# Patient Record
Sex: Female | Born: 1935 | Race: White | Hispanic: No | Smoking: Never smoker
Health system: Southern US, Community
[De-identification: ages and names within clinical notes are randomized; demographics above are authoritative.]

## PROBLEM LIST (undated history)

## (undated) DIAGNOSIS — M1990 Unspecified osteoarthritis, unspecified site: Secondary | ICD-10-CM

## (undated) DIAGNOSIS — D62 Acute posthemorrhagic anemia: Secondary | ICD-10-CM

## (undated) DIAGNOSIS — G8929 Other chronic pain: Secondary | ICD-10-CM

## (undated) DIAGNOSIS — M810 Age-related osteoporosis without current pathological fracture: Secondary | ICD-10-CM

## (undated) DIAGNOSIS — R51 Headache: Secondary | ICD-10-CM

## (undated) DIAGNOSIS — F329 Major depressive disorder, single episode, unspecified: Secondary | ICD-10-CM

## (undated) DIAGNOSIS — M1612 Unilateral primary osteoarthritis, left hip: Principal | ICD-10-CM

## (undated) DIAGNOSIS — H269 Unspecified cataract: Secondary | ICD-10-CM

## (undated) DIAGNOSIS — Q762 Congenital spondylolisthesis: Secondary | ICD-10-CM

## (undated) DIAGNOSIS — M549 Dorsalgia, unspecified: Secondary | ICD-10-CM

## (undated) DIAGNOSIS — R351 Nocturia: Secondary | ICD-10-CM

## (undated) DIAGNOSIS — Z9889 Other specified postprocedural states: Secondary | ICD-10-CM

## (undated) DIAGNOSIS — G4700 Insomnia, unspecified: Secondary | ICD-10-CM

## (undated) DIAGNOSIS — R112 Nausea with vomiting, unspecified: Secondary | ICD-10-CM

## (undated) DIAGNOSIS — M2540 Effusion, unspecified joint: Secondary | ICD-10-CM

## (undated) DIAGNOSIS — K222 Esophageal obstruction: Secondary | ICD-10-CM

## (undated) DIAGNOSIS — M2550 Pain in unspecified joint: Secondary | ICD-10-CM

## (undated) DIAGNOSIS — Z8709 Personal history of other diseases of the respiratory system: Secondary | ICD-10-CM

## (undated) DIAGNOSIS — Z860100 Personal history of colon polyps, unspecified: Secondary | ICD-10-CM

## (undated) DIAGNOSIS — H409 Unspecified glaucoma: Secondary | ICD-10-CM

## (undated) DIAGNOSIS — D649 Anemia, unspecified: Secondary | ICD-10-CM

## (undated) DIAGNOSIS — F32A Depression, unspecified: Secondary | ICD-10-CM

## (undated) DIAGNOSIS — R238 Other skin changes: Secondary | ICD-10-CM

## (undated) DIAGNOSIS — R1310 Dysphagia, unspecified: Secondary | ICD-10-CM

## (undated) DIAGNOSIS — M48061 Spinal stenosis, lumbar region without neurogenic claudication: Secondary | ICD-10-CM

## (undated) DIAGNOSIS — I4891 Unspecified atrial fibrillation: Secondary | ICD-10-CM

## (undated) DIAGNOSIS — I10 Essential (primary) hypertension: Secondary | ICD-10-CM

## (undated) DIAGNOSIS — D126 Benign neoplasm of colon, unspecified: Secondary | ICD-10-CM

## (undated) DIAGNOSIS — R233 Spontaneous ecchymoses: Secondary | ICD-10-CM

## (undated) DIAGNOSIS — K219 Gastro-esophageal reflux disease without esophagitis: Secondary | ICD-10-CM

## (undated) HISTORY — PX: EPIDURAL BLOCK INJECTION: SHX1516

## (undated) HISTORY — DX: Esophageal obstruction: K22.2

## (undated) HISTORY — PX: TOTAL HIP ARTHROPLASTY: SHX124

## (undated) HISTORY — DX: Congenital spondylolisthesis: Q76.2

## (undated) HISTORY — PX: OTHER SURGICAL HISTORY: SHX169

## (undated) HISTORY — DX: Age-related osteoporosis without current pathological fracture: M81.0

## (undated) HISTORY — PX: ESOPHAGOGASTRODUODENOSCOPY: SHX1529

## (undated) HISTORY — DX: Benign neoplasm of colon, unspecified: D12.6

## (undated) HISTORY — PX: APPENDECTOMY: SHX54

## (undated) HISTORY — DX: Spinal stenosis, lumbar region without neurogenic claudication: M48.061

## (undated) HISTORY — DX: Dysphagia, unspecified: R13.10

## (undated) HISTORY — DX: Acute posthemorrhagic anemia: D62

## (undated) HISTORY — DX: Unilateral primary osteoarthritis, left hip: M16.12

## (undated) HISTORY — PX: COLONOSCOPY: SHX174

---

## 1997-09-16 ENCOUNTER — Other Ambulatory Visit: Admission: RE | Admit: 1997-09-16 | Discharge: 1997-09-16 | Payer: Self-pay | Admitting: Obstetrics and Gynecology

## 1998-05-20 ENCOUNTER — Ambulatory Visit (HOSPITAL_BASED_OUTPATIENT_CLINIC_OR_DEPARTMENT_OTHER): Admission: RE | Admit: 1998-05-20 | Discharge: 1998-05-20 | Payer: Self-pay | Admitting: Plastic Surgery

## 1998-09-29 ENCOUNTER — Other Ambulatory Visit: Admission: RE | Admit: 1998-09-29 | Discharge: 1998-09-29 | Payer: Self-pay | Admitting: Obstetrics and Gynecology

## 1998-10-20 ENCOUNTER — Other Ambulatory Visit: Admission: RE | Admit: 1998-10-20 | Discharge: 1998-10-20 | Payer: Self-pay | Admitting: Obstetrics and Gynecology

## 1998-11-06 ENCOUNTER — Encounter: Payer: Self-pay | Admitting: Obstetrics and Gynecology

## 1998-11-06 ENCOUNTER — Ambulatory Visit: Admission: RE | Admit: 1998-11-06 | Discharge: 1998-11-06 | Payer: Self-pay | Admitting: Obstetrics and Gynecology

## 1998-12-16 ENCOUNTER — Encounter (INDEPENDENT_AMBULATORY_CARE_PROVIDER_SITE_OTHER): Payer: Self-pay | Admitting: Specialist

## 1998-12-16 ENCOUNTER — Ambulatory Visit (HOSPITAL_COMMUNITY): Admission: RE | Admit: 1998-12-16 | Discharge: 1998-12-16 | Payer: Self-pay | Admitting: Obstetrics and Gynecology

## 1999-04-12 ENCOUNTER — Other Ambulatory Visit: Admission: RE | Admit: 1999-04-12 | Discharge: 1999-04-12 | Payer: Self-pay | Admitting: Obstetrics and Gynecology

## 1999-04-12 ENCOUNTER — Encounter (INDEPENDENT_AMBULATORY_CARE_PROVIDER_SITE_OTHER): Payer: Self-pay | Admitting: Specialist

## 1999-06-14 HISTORY — PX: JOINT REPLACEMENT: SHX530

## 1999-12-16 ENCOUNTER — Other Ambulatory Visit: Admission: RE | Admit: 1999-12-16 | Discharge: 1999-12-16 | Payer: Self-pay | Admitting: Obstetrics and Gynecology

## 2000-03-16 ENCOUNTER — Encounter (INDEPENDENT_AMBULATORY_CARE_PROVIDER_SITE_OTHER): Payer: Self-pay | Admitting: Specialist

## 2000-03-16 ENCOUNTER — Ambulatory Visit (HOSPITAL_BASED_OUTPATIENT_CLINIC_OR_DEPARTMENT_OTHER): Admission: RE | Admit: 2000-03-16 | Discharge: 2000-03-16 | Payer: Self-pay | Admitting: Plastic Surgery

## 2000-06-19 ENCOUNTER — Encounter: Payer: Self-pay | Admitting: Orthopedic Surgery

## 2000-06-22 ENCOUNTER — Inpatient Hospital Stay (HOSPITAL_COMMUNITY): Admission: RE | Admit: 2000-06-22 | Discharge: 2000-06-29 | Payer: Self-pay | Admitting: Orthopedic Surgery

## 2000-06-22 ENCOUNTER — Encounter: Payer: Self-pay | Admitting: Orthopedic Surgery

## 2000-12-18 ENCOUNTER — Other Ambulatory Visit: Admission: RE | Admit: 2000-12-18 | Discharge: 2000-12-18 | Payer: Self-pay | Admitting: Obstetrics and Gynecology

## 2002-02-18 ENCOUNTER — Encounter: Admission: RE | Admit: 2002-02-18 | Discharge: 2002-02-18 | Payer: Self-pay | Admitting: Orthopedic Surgery

## 2002-02-18 ENCOUNTER — Encounter: Payer: Self-pay | Admitting: Orthopedic Surgery

## 2002-02-19 ENCOUNTER — Ambulatory Visit (HOSPITAL_BASED_OUTPATIENT_CLINIC_OR_DEPARTMENT_OTHER): Admission: RE | Admit: 2002-02-19 | Discharge: 2002-02-19 | Payer: Self-pay | Admitting: Orthopedic Surgery

## 2002-04-08 ENCOUNTER — Encounter: Payer: Self-pay | Admitting: Orthopedic Surgery

## 2002-04-08 ENCOUNTER — Encounter: Admission: RE | Admit: 2002-04-08 | Discharge: 2002-04-08 | Payer: Self-pay | Admitting: Orthopedic Surgery

## 2002-04-22 ENCOUNTER — Encounter: Payer: Self-pay | Admitting: Orthopedic Surgery

## 2002-04-22 ENCOUNTER — Encounter: Admission: RE | Admit: 2002-04-22 | Discharge: 2002-04-22 | Payer: Self-pay | Admitting: Orthopedic Surgery

## 2002-05-06 ENCOUNTER — Encounter: Admission: RE | Admit: 2002-05-06 | Discharge: 2002-05-06 | Payer: Self-pay | Admitting: Orthopedic Surgery

## 2002-05-06 ENCOUNTER — Encounter: Payer: Self-pay | Admitting: Orthopedic Surgery

## 2002-09-12 ENCOUNTER — Ambulatory Visit (HOSPITAL_COMMUNITY): Admission: RE | Admit: 2002-09-12 | Discharge: 2002-09-12 | Payer: Self-pay | Admitting: Internal Medicine

## 2002-09-12 ENCOUNTER — Encounter: Payer: Self-pay | Admitting: Internal Medicine

## 2003-10-30 ENCOUNTER — Encounter: Admission: RE | Admit: 2003-10-30 | Discharge: 2003-10-30 | Payer: Self-pay | Admitting: Orthopedic Surgery

## 2003-11-18 ENCOUNTER — Ambulatory Visit (HOSPITAL_BASED_OUTPATIENT_CLINIC_OR_DEPARTMENT_OTHER): Admission: RE | Admit: 2003-11-18 | Discharge: 2003-11-18 | Payer: Self-pay | Admitting: Orthopedic Surgery

## 2003-11-18 ENCOUNTER — Ambulatory Visit (HOSPITAL_COMMUNITY): Admission: RE | Admit: 2003-11-18 | Discharge: 2003-11-18 | Payer: Self-pay | Admitting: Orthopedic Surgery

## 2003-11-21 ENCOUNTER — Encounter: Admission: RE | Admit: 2003-11-21 | Discharge: 2003-11-21 | Payer: Self-pay | Admitting: Orthopedic Surgery

## 2003-12-10 ENCOUNTER — Encounter: Admission: RE | Admit: 2003-12-10 | Discharge: 2003-12-10 | Payer: Self-pay | Admitting: Orthopedic Surgery

## 2007-10-09 ENCOUNTER — Telehealth: Payer: Self-pay | Admitting: Internal Medicine

## 2007-10-17 ENCOUNTER — Ambulatory Visit: Payer: Self-pay | Admitting: Internal Medicine

## 2007-10-23 ENCOUNTER — Encounter: Payer: Self-pay | Admitting: Internal Medicine

## 2007-10-23 ENCOUNTER — Ambulatory Visit: Payer: Self-pay | Admitting: Internal Medicine

## 2008-10-27 ENCOUNTER — Ambulatory Visit: Payer: Self-pay | Admitting: Internal Medicine

## 2008-10-27 DIAGNOSIS — Z860100 Personal history of colon polyps, unspecified: Secondary | ICD-10-CM | POA: Insufficient documentation

## 2008-10-27 DIAGNOSIS — K219 Gastro-esophageal reflux disease without esophagitis: Secondary | ICD-10-CM

## 2008-10-27 DIAGNOSIS — K222 Esophageal obstruction: Secondary | ICD-10-CM

## 2008-10-27 DIAGNOSIS — R1319 Other dysphagia: Secondary | ICD-10-CM

## 2010-04-21 ENCOUNTER — Encounter: Admission: RE | Admit: 2010-04-21 | Discharge: 2010-04-21 | Payer: Self-pay | Admitting: Neurological Surgery

## 2010-07-14 ENCOUNTER — Other Ambulatory Visit: Payer: Self-pay | Admitting: Dermatology

## 2010-10-29 NOTE — Op Note (Signed)
NAME:  Martha Hendrix, Martha Hendrix                     ACCOUNT NO.:  [id number]   MEDICAL RECORD NO.:  [id number]                   PATIENT TYPE:  AMB   LOCATION:  DSC                                  FACILITY:  [facility]   PHYSICIAN:  Katy Fitch. Naaman Plummer., M.D.          DATE OF BIRTH:  Feb 24, 1927   DATE OF PROCEDURE:  11/18/2003  DATE OF DISCHARGE:                                 OPERATIVE REPORT   PREOPERATIVE DIAGNOSIS:  Entrapment neuropathy, median nerve, right carpal  tunnel.   POSTOPERATIVE DIAGNOSIS:  Entrapment neuropathy, median nerve, right carpal  tunnel.   OPERATION:  Release of right transcarpal ligament.   SURGEON:  Katy Fitch. Sypher, M.D.   ASSISTANT:  Martha Hendrix, P.A.   ANESTHESIA:  General by LMA, supervised by anesthesiologist, Dr. Sondra Come.   INDICATIONS:  Martha Hendrix is a 77 year old woman who has a history of  bilateral carpal tunnel syndrome.  She is status post release of her left  transverse carpal ligament with a satisfactory result several years past.  She returned requesting identical surgery on the right.   Prior electrodiagnostic studies had revealed significant right carpal tunnel  syndrome.   After informed consent, she was brought to the operating room at this time.   PROCEDURE:  [name] was brought to the operating room and placed  in supine position upon the operating table.  Following induction of general  anesthesia by LMA, the right arm was prepped with Betadine soap and solution  and sterilely draped.  A pneumatic tourniquet was applied to the proximal  brachium.   After exsanguination of the right arm with an Esmarch bandage, the arterial  tourniquet was placed to 220 mmHg.   The procedure commenced with a short incision in line with the ring finger  in the palm.  Subcutaneous tissues were carefully divided revealing the  palmar fascia.  This was split longitudinally in the ________ branch of the  median nerve.   These  were followed back to the transverse carpal ligament, which was  carefully isolated from the median nerve.   The ligament was released along the ulnar border extending into the distal  forearm.   This widened the endocarpal canal.  No mass or other predicaments were  noted.  Bleeding points along the margin of this ligament were  insignificant, therefore, no electrocautery was required.   The wound was repaired with intradermal 3-0 Prolene suture.   A compressive dressing was applied with a volar plaster splint and  mechanically in 5 degrees of dorsiflexion.   AFTERCARE:  [name] was given a prescription for Darvocet-N 100, 20  tablets, 1 p.o. q.4-6h. p.r.n. pain, no refills.                                               Katy Fitch  Naaman Plummer., M.D.    RVS/MEDQ  D:  11/18/2003  T:  11/19/2003  Job:  [id number]   cc:   Geoffry Paradise, M.D.  [address]  [place]  [place] [address]  Fax: [phone]

## 2010-10-29 NOTE — Op Note (Signed)
[place]. [facility]  Patient:    DESTA, BUJAK                  MRN: [id number] Proc. Date: 06/22/00 Adm. Date:  [id number] Attending:  Aldean Baker V                           Operative Report  PREOPERATIVE DIAGNOSIS:  Osteoarthritis, right hip.  POSTOPERATIVE DIAGNOSIS:  Osteoarthritis, right hip.  PROCEDURE:  Right total hip arthroplasty with Osteonics Secure Fit acetabulum and Omnifit femoral component with #8 stem, pressfit, 54 mm acetabulum, +0 neck, 28 mm head, 10 degree poly liner.  SURGEON:  Nadara Mustard, M.D.  ASSISTANT:  Colon Flattery. Ollen Bowl, M.D.  ANESTHESIA:  General endotracheal.  ESTIMATED BLOOD LOSS:  300 cc.  ANTIBIOTICS:  1 g Kefzol.  DRAINS:  None.  COMPLICATIONS:  None.  DISPOSITION:  PACU in stable condition.  INDICATION FOR PROCEDURE:  Patient is a 77 year old woman with severe osteoarthritis of her right hip.  Patient is unable to perform activities of daily living due to her right hip symptoms and presents at this time for total hip replacement.  The risks and benefits were discussed _____ with leg length and equality, dislocation, infection, nerve injury, DVT.  The patient states she understands and wishes to proceed at this time.  DESCRIPTION OF PROCEDURE:  The patient was brought to the OR room 4 and underwent a general endotracheal anesthetic.  After an adequate level of anesthesia obtained, the patient was placed in the left lateral decubitus position with the right side up, and her left lower extremity was prepped using Duraprep and draped in a sterile field with Ioban covering all exposed skin.  A posterolateral incision was made.  This was carried down through the tensor fascia lata, and a Charnley retractor was placed.  The short external rotators were tagged and retracted.  The capsule was tagged and teed and used later for repair.  The femoral neck cut was made 1 cm proximal to the calcar on the angle of  one of the broaches.  Attention was first focused on the acetabulum.  This was reamed up to a 54 mm.  There were large amounts of osteophytic bone cysts, and the cysts were curetted and packed with bone graft from the femoral head.  All osteophytes around the lip were also debrided. The final 54 mm acetabulum was inserted with a good press-fit.  The trial poly liner was inserted, and attention was then focused on the femoral component. This was reamed and broached up to an 8 mm femoral stem.  This was broached and distally reamed up to a 12.5 for the Secure Fit Plus distal tip.  This was placed through a range of motion with a +0 neck and had a full 100 degrees of flexion, full adduction, and 45 degrees of internal rotation.  The hip was stable.  The patient had full extension and external rotation with no instability anteriorly.  There was a negative shuck.  The wound was then irrigated.  The final femoral component was placed with a +0 neck, reduced, again placed through a full range of motion.  The hip was stable with no dislocation.  The wound was again irrigated.  The capsule was closed using a #1 Ethibond.  The tensor fascia lata was closed using #1 Vicryl.  The deep fascia was closed using 0 Vicryl.  The subcutaneous tissue was closed  using 2-0 Vicryl.  The skin was closed using Approximate staples.  The wounds were covered with Adaptic orthopedic sponges, ABD dressing, and Hypafix tape. The patient was transferred supine.  Knee immobilizer was applied.  Extubated and taken to PACU in stable condition. DD:  06/22/00 TD:  06/22/00 Job: [id number]

## 2010-10-29 NOTE — H&P (Signed)
[place]. [facility]  Patient:    Martha Hendrix, Martha Hendrix                  MRN: [id number] Adm. Date:  [id number] Attending:  Nadara Mustard                         History and Physical  HISTORY OF PRESENT ILLNESS:  Martha Hendrix is a 77 year old woman with severe right hip pain gradually increasing status post a right total knee on Nov 07, 1999 in [place].  The patient states that due to decreasing range of motion of her hip, unable to perform activities of daily living.  She presents at this time for a total hip arthroplasty.  MEDICATIONS:  Vioxx 50 mg p.o. q.d.  Darvocet N 100 one p.o. q.8h. p.r.n. for pain.  Estrogen replacement with Provera 2.5 mg a day and Estrace 1 mg a day.  ALLERGIES:  Codeine causes nausea and vomiting.  PAST SURGICAL HISTORY:  Right total knee replacement in May 2001.  FAMILY HISTORY:  Positive for diabetes and heart disease.  SOCIAL HISTORY:  Negative for tobacco.  Positive for social alcohol use.  REVIEW OF SYSTEMS:  Positive for arthritis. Negative for substernal chest pain.  Negative for shortness of breath.  PHYSICAL EXAMINATION:  VITAL SIGNS:  Temperature 97.8, pulse 74, respiratory rate 16, blood pressure 138/90.  GENERAL:  She is in no acute distress.  NECK: Supple.  No bruits.  LUNGS: Clear to auscultation.  CARDIOVASCULAR:  Regular rate and rhythm.  EXTREMITIES: Internal and external rotation of her right hip is less than 10 degrees.  She does have full flexion to 90 degrees and extension of 0 degrees. Left hip has 45 degrees of internal and external rotation.  She does have palpable pulses.  Foot is neurovascularly intact.  Radiograph shows a severe bone on bone degenerative osteoarthritis of the right hip.  ASSESSMENT:  Severe osteoarthritis right hip.  PLAN:  She is scheduled at this time for right total hip arthroplasty.  The risks and benefits were discussed including infection, neurovascular  injury, persistent pain, dislocation, leg length unequalities, DVT, pulmonary embolus and death.  The patient states she understands and wishes to proceed at this time. DD:  06/22/00 TD:  06/22/00 Job: [id number]

## 2010-10-29 NOTE — Op Note (Signed)
Martha Hendrix, Martha Hendrix                     ACCOUNT NO.:  [id number]   MEDICAL RECORD NO.:  [id number]                   PATIENT TYPE:  AMB   LOCATION:  DSC                                  FACILITY:  [facility]   PHYSICIAN:  Katy Fitch. Naaman Plummer., M.D.          DATE OF BIRTH:  04-22-1927   DATE OF PROCEDURE:  02/18/2002  DATE OF DISCHARGE:                                 OPERATIVE REPORT   PREOPERATIVE DIAGNOSES:  Chronic entrapment neuropathy of left median nerve  at carpal tunnel, severe.   POSTOPERATIVE DIAGNOSES:  Chronic entrapment neuropathy of left median nerve  at carpal tunnel, severe.   OPERATION PERFORMED:  Release of left transverse carpal ligament.   SURGEON:  Katy Fitch. Sypher, M.D.   ASSISTANT:  Jonni Sanger, P.A.   ANESTHESIA:  IV regional at proximal forearm level.   SUPERVISING ANESTHESIOLOGIST:  Janetta Hora. Gelene Mink, M.D.   INDICATIONS FOR PROCEDURE:  The patient is a 77 year old right hand dominant  woman who presented for evaluation and management of a painful and numb left  hand.  Clinical examination revealed signs of mild thenar atrophy and  diminished sensibility in the median distribution.  Electrodiagnostic  studies completed by Dr. Johna Roles revealed severe carpal tunnel syndrome  with a nonrecordable motor latency.  Due to failure to respond to nonoperative measures, she is brought to the  operating room at this time for release of the left transverse carpal  ligament.   DESCRIPTION OF PROCEDURE:  The patient was brought to the operating room and  placed in supine position upon the operating table.  Following placement of  a forearm level IV regional block, the left arm was prepped with Betadine  soap and solution and sterilely draped.  When anesthesia was satisfactory,  the procedure commenced with a short incision in line with the ring finger  in the palm.  Subcutaneous tissues were carefully divided revealing the  palmar fascia.  This was  split longitudinally to reveal the common sensory  branch of the median nerve.  This was followed back to the transverse carpal  ligament which was carefully isolated from the median nerve.  It was  released on its ulnar border extending to the distal forearm.  This widely  opened the carpal canal.  Bleeding points along the margin of the released  ligament were electrocauterized with bipolar current followed by repair of  the skin with intradermal 3-0 Prolene suture.  A compressive dressing was  applied with a volar plaster splint maintaining the wrist in five degrees  dorsiflexion.  There were no apparent complications.  For aftercare, she was  given a Marcaine block for postoperative analgesia.  She was given a  prescription for Darvocet-N 100 20 tablets one or two p.o. q.4-6h. p.r.n.  pain.  We have asked the patient to return to our office in follow-up in 7  to 10 days for dressing change, suture removal and advancement to our  therapy program.                                                Katy Fitch. Naaman Plummer., M.D.   RVS/MEDQ  D:  02/19/2002  T:  02/19/2002  Job:  [id number]

## 2010-10-29 NOTE — Discharge Summary (Signed)
[place]. [facility]  Patient:    Martha Hendrix, Martha Hendrix                  MRN: [id number] Adm. Date:  [id number] Disc. Date: [id number] Attending:  Aldean Baker V                           Discharge Summary  DIAGNOSIS:  Osteoarthritis, right hip.  PROCEDURE:  Right total hip arthroplasty.  DISPOSITION:  Discharged to home on stable condition.  FOLLOW-UP:  Follow up in the office in two weeks.  HISTORY OF PRESENT ILLNESS:  The patient is a 77 year old woman with severe right hip osteoarthritis, status post a right total knee replacement in 2001 in [place], [place].  Due to her decreasing range of motion, unable to perform activities of daily living due to right hip pain, she presents at this time for a right total hip arthroplasty.  BRIEF OPERATIVE NOTE:  June 23, 1999.  Diagnosis:  Right hip osteoarthritis.  Procedure:  Right total hip arthroplasty with Osteonics #8 stem, 54 mm acetabulum, +0 neck, 28 mm head.  Surgeon:  Nadara Mustard, M.D. Assistant:  Colon Flattery. Ollen Bowl, M.D.  Anesthesia:  General endotracheal. Antibiotics:  1 g of Kefzol.  Disposition:  To PACU in stable condition.  HOSPITAL COURSE:  The patients postoperative course was essentially unremarkable.  She was treated with Coumadin for DVT prophylaxis and Kefzol for infection prophylaxis.  She was started with physical therapy and occupational therapy.  She was started with touchdown weightbearing on the right.  She progressed well with therapy.  Her hemoglobin was 10.0 on April 14, 2000.  She complained of some heel pain and she was placed on waffle foam under her heels to decrease the pressure.  She progressed well and was felt to be stable for discharge to home.  She was discharged to home in stable condition on June 29, 2000.  Will plan to follow up in the office in one week.  Continue with home health nursing for home INR draws.  To continue her Coumadin. DD:   07/25/00 TD:  07/25/00 Job: [id number]

## 2011-06-16 DIAGNOSIS — H4010X0 Unspecified open-angle glaucoma, stage unspecified: Secondary | ICD-10-CM | POA: Diagnosis not present

## 2011-06-16 DIAGNOSIS — H5340 Unspecified visual field defects: Secondary | ICD-10-CM | POA: Diagnosis not present

## 2011-08-08 DIAGNOSIS — Q762 Congenital spondylolisthesis: Secondary | ICD-10-CM | POA: Diagnosis not present

## 2011-08-08 DIAGNOSIS — I10 Essential (primary) hypertension: Secondary | ICD-10-CM | POA: Diagnosis not present

## 2011-08-08 DIAGNOSIS — M1990 Unspecified osteoarthritis, unspecified site: Secondary | ICD-10-CM | POA: Diagnosis not present

## 2011-08-17 DIAGNOSIS — H409 Unspecified glaucoma: Secondary | ICD-10-CM | POA: Diagnosis not present

## 2011-08-17 DIAGNOSIS — H4010X0 Unspecified open-angle glaucoma, stage unspecified: Secondary | ICD-10-CM | POA: Diagnosis not present

## 2011-11-17 DIAGNOSIS — M1710 Unilateral primary osteoarthritis, unspecified knee: Secondary | ICD-10-CM | POA: Diagnosis not present

## 2011-11-18 DIAGNOSIS — I10 Essential (primary) hypertension: Secondary | ICD-10-CM | POA: Diagnosis not present

## 2011-11-18 DIAGNOSIS — R82998 Other abnormal findings in urine: Secondary | ICD-10-CM | POA: Diagnosis not present

## 2011-11-18 DIAGNOSIS — E785 Hyperlipidemia, unspecified: Secondary | ICD-10-CM | POA: Diagnosis not present

## 2011-12-02 DIAGNOSIS — Z0000 Encounter for general adult medical examination without abnormal findings: Secondary | ICD-10-CM | POA: Diagnosis not present

## 2011-12-02 DIAGNOSIS — I10 Essential (primary) hypertension: Secondary | ICD-10-CM | POA: Diagnosis not present

## 2011-12-02 DIAGNOSIS — E785 Hyperlipidemia, unspecified: Secondary | ICD-10-CM | POA: Diagnosis not present

## 2011-12-02 DIAGNOSIS — M1990 Unspecified osteoarthritis, unspecified site: Secondary | ICD-10-CM | POA: Diagnosis not present

## 2011-12-19 DIAGNOSIS — M47817 Spondylosis without myelopathy or radiculopathy, lumbosacral region: Secondary | ICD-10-CM | POA: Diagnosis not present

## 2011-12-21 DIAGNOSIS — H4011X0 Primary open-angle glaucoma, stage unspecified: Secondary | ICD-10-CM | POA: Diagnosis not present

## 2011-12-21 DIAGNOSIS — H409 Unspecified glaucoma: Secondary | ICD-10-CM | POA: Diagnosis not present

## 2011-12-21 DIAGNOSIS — H259 Unspecified age-related cataract: Secondary | ICD-10-CM | POA: Diagnosis not present

## 2012-03-30 DIAGNOSIS — Z1231 Encounter for screening mammogram for malignant neoplasm of breast: Secondary | ICD-10-CM | POA: Diagnosis not present

## 2012-04-02 DIAGNOSIS — H6120 Impacted cerumen, unspecified ear: Secondary | ICD-10-CM | POA: Diagnosis not present

## 2012-04-20 DIAGNOSIS — H409 Unspecified glaucoma: Secondary | ICD-10-CM | POA: Diagnosis not present

## 2012-04-20 DIAGNOSIS — H259 Unspecified age-related cataract: Secondary | ICD-10-CM | POA: Diagnosis not present

## 2012-04-20 DIAGNOSIS — H4011X0 Primary open-angle glaucoma, stage unspecified: Secondary | ICD-10-CM | POA: Diagnosis not present

## 2012-04-20 DIAGNOSIS — H04129 Dry eye syndrome of unspecified lacrimal gland: Secondary | ICD-10-CM | POA: Diagnosis not present

## 2012-04-24 DIAGNOSIS — Z23 Encounter for immunization: Secondary | ICD-10-CM | POA: Diagnosis not present

## 2012-06-01 DIAGNOSIS — M47817 Spondylosis without myelopathy or radiculopathy, lumbosacral region: Secondary | ICD-10-CM | POA: Diagnosis not present

## 2012-06-13 HISTORY — PX: OTHER SURGICAL HISTORY: SHX169

## 2012-06-20 DIAGNOSIS — I10 Essential (primary) hypertension: Secondary | ICD-10-CM | POA: Diagnosis not present

## 2012-06-20 DIAGNOSIS — M1990 Unspecified osteoarthritis, unspecified site: Secondary | ICD-10-CM | POA: Diagnosis not present

## 2012-06-20 DIAGNOSIS — E785 Hyperlipidemia, unspecified: Secondary | ICD-10-CM | POA: Diagnosis not present

## 2012-06-20 DIAGNOSIS — K219 Gastro-esophageal reflux disease without esophagitis: Secondary | ICD-10-CM | POA: Diagnosis not present

## 2012-08-08 DIAGNOSIS — I10 Essential (primary) hypertension: Secondary | ICD-10-CM | POA: Diagnosis not present

## 2012-08-08 DIAGNOSIS — M79609 Pain in unspecified limb: Secondary | ICD-10-CM | POA: Diagnosis not present

## 2012-09-20 DIAGNOSIS — M25519 Pain in unspecified shoulder: Secondary | ICD-10-CM | POA: Diagnosis not present

## 2012-09-20 DIAGNOSIS — M542 Cervicalgia: Secondary | ICD-10-CM | POA: Diagnosis not present

## 2012-09-26 DIAGNOSIS — H4011X0 Primary open-angle glaucoma, stage unspecified: Secondary | ICD-10-CM | POA: Diagnosis not present

## 2012-10-08 DIAGNOSIS — H259 Unspecified age-related cataract: Secondary | ICD-10-CM | POA: Diagnosis not present

## 2012-10-08 DIAGNOSIS — H4011X0 Primary open-angle glaucoma, stage unspecified: Secondary | ICD-10-CM | POA: Diagnosis not present

## 2012-10-08 DIAGNOSIS — H409 Unspecified glaucoma: Secondary | ICD-10-CM | POA: Diagnosis not present

## 2012-10-08 DIAGNOSIS — H5340 Unspecified visual field defects: Secondary | ICD-10-CM | POA: Diagnosis not present

## 2012-10-09 DIAGNOSIS — T148XXA Other injury of unspecified body region, initial encounter: Secondary | ICD-10-CM | POA: Diagnosis not present

## 2012-10-09 DIAGNOSIS — M79609 Pain in unspecified limb: Secondary | ICD-10-CM | POA: Diagnosis not present

## 2012-10-09 DIAGNOSIS — Z6825 Body mass index (BMI) 25.0-25.9, adult: Secondary | ICD-10-CM | POA: Diagnosis not present

## 2012-10-19 DIAGNOSIS — D1801 Hemangioma of skin and subcutaneous tissue: Secondary | ICD-10-CM | POA: Diagnosis not present

## 2012-10-19 DIAGNOSIS — L821 Other seborrheic keratosis: Secondary | ICD-10-CM | POA: Diagnosis not present

## 2012-10-19 DIAGNOSIS — Z85828 Personal history of other malignant neoplasm of skin: Secondary | ICD-10-CM | POA: Diagnosis not present

## 2012-10-19 DIAGNOSIS — L819 Disorder of pigmentation, unspecified: Secondary | ICD-10-CM | POA: Diagnosis not present

## 2012-10-22 DIAGNOSIS — I10 Essential (primary) hypertension: Secondary | ICD-10-CM | POA: Diagnosis not present

## 2012-10-22 DIAGNOSIS — T148XXA Other injury of unspecified body region, initial encounter: Secondary | ICD-10-CM | POA: Diagnosis not present

## 2012-11-02 DIAGNOSIS — I10 Essential (primary) hypertension: Secondary | ICD-10-CM | POA: Diagnosis not present

## 2012-11-02 DIAGNOSIS — Q762 Congenital spondylolisthesis: Secondary | ICD-10-CM | POA: Diagnosis not present

## 2012-11-02 DIAGNOSIS — M1990 Unspecified osteoarthritis, unspecified site: Secondary | ICD-10-CM | POA: Diagnosis not present

## 2012-11-02 DIAGNOSIS — M76899 Other specified enthesopathies of unspecified lower limb, excluding foot: Secondary | ICD-10-CM | POA: Diagnosis not present

## 2012-11-06 DIAGNOSIS — H269 Unspecified cataract: Secondary | ICD-10-CM | POA: Diagnosis not present

## 2012-11-06 DIAGNOSIS — H25039 Anterior subcapsular polar age-related cataract, unspecified eye: Secondary | ICD-10-CM | POA: Diagnosis not present

## 2012-11-06 DIAGNOSIS — H2510 Age-related nuclear cataract, unspecified eye: Secondary | ICD-10-CM | POA: Diagnosis not present

## 2012-11-06 DIAGNOSIS — H409 Unspecified glaucoma: Secondary | ICD-10-CM | POA: Diagnosis not present

## 2012-11-06 DIAGNOSIS — H4011X0 Primary open-angle glaucoma, stage unspecified: Secondary | ICD-10-CM | POA: Diagnosis not present

## 2012-11-30 DIAGNOSIS — M76899 Other specified enthesopathies of unspecified lower limb, excluding foot: Secondary | ICD-10-CM | POA: Diagnosis not present

## 2012-12-04 DIAGNOSIS — E785 Hyperlipidemia, unspecified: Secondary | ICD-10-CM | POA: Diagnosis not present

## 2012-12-04 DIAGNOSIS — R82998 Other abnormal findings in urine: Secondary | ICD-10-CM | POA: Diagnosis not present

## 2012-12-04 DIAGNOSIS — I10 Essential (primary) hypertension: Secondary | ICD-10-CM | POA: Diagnosis not present

## 2012-12-10 DIAGNOSIS — E785 Hyperlipidemia, unspecified: Secondary | ICD-10-CM | POA: Diagnosis not present

## 2012-12-10 DIAGNOSIS — M1990 Unspecified osteoarthritis, unspecified site: Secondary | ICD-10-CM | POA: Diagnosis not present

## 2012-12-10 DIAGNOSIS — Z23 Encounter for immunization: Secondary | ICD-10-CM | POA: Diagnosis not present

## 2012-12-10 DIAGNOSIS — K219 Gastro-esophageal reflux disease without esophagitis: Secondary | ICD-10-CM | POA: Diagnosis not present

## 2012-12-10 DIAGNOSIS — Q762 Congenital spondylolisthesis: Secondary | ICD-10-CM | POA: Diagnosis not present

## 2012-12-10 DIAGNOSIS — I10 Essential (primary) hypertension: Secondary | ICD-10-CM | POA: Diagnosis not present

## 2012-12-10 DIAGNOSIS — Z0000 Encounter for general adult medical examination without abnormal findings: Secondary | ICD-10-CM | POA: Diagnosis not present

## 2012-12-21 DIAGNOSIS — R51 Headache: Secondary | ICD-10-CM | POA: Diagnosis not present

## 2012-12-21 DIAGNOSIS — I10 Essential (primary) hypertension: Secondary | ICD-10-CM | POA: Diagnosis not present

## 2012-12-21 DIAGNOSIS — F329 Major depressive disorder, single episode, unspecified: Secondary | ICD-10-CM | POA: Diagnosis not present

## 2012-12-21 DIAGNOSIS — R5381 Other malaise: Secondary | ICD-10-CM | POA: Diagnosis not present

## 2012-12-21 DIAGNOSIS — R5383 Other fatigue: Secondary | ICD-10-CM | POA: Diagnosis not present

## 2012-12-24 DIAGNOSIS — R51 Headache: Secondary | ICD-10-CM | POA: Diagnosis not present

## 2012-12-24 DIAGNOSIS — I6789 Other cerebrovascular disease: Secondary | ICD-10-CM | POA: Diagnosis not present

## 2012-12-26 DIAGNOSIS — H6120 Impacted cerumen, unspecified ear: Secondary | ICD-10-CM | POA: Diagnosis not present

## 2013-01-04 DIAGNOSIS — D692 Other nonthrombocytopenic purpura: Secondary | ICD-10-CM | POA: Diagnosis not present

## 2013-01-04 DIAGNOSIS — L820 Inflamed seborrheic keratosis: Secondary | ICD-10-CM | POA: Diagnosis not present

## 2013-01-04 DIAGNOSIS — L821 Other seborrheic keratosis: Secondary | ICD-10-CM | POA: Diagnosis not present

## 2013-01-04 DIAGNOSIS — Z85828 Personal history of other malignant neoplasm of skin: Secondary | ICD-10-CM | POA: Diagnosis not present

## 2013-01-04 DIAGNOSIS — L570 Actinic keratosis: Secondary | ICD-10-CM | POA: Diagnosis not present

## 2013-01-04 DIAGNOSIS — L819 Disorder of pigmentation, unspecified: Secondary | ICD-10-CM | POA: Diagnosis not present

## 2013-01-04 DIAGNOSIS — D1801 Hemangioma of skin and subcutaneous tissue: Secondary | ICD-10-CM | POA: Diagnosis not present

## 2013-01-15 DIAGNOSIS — M76899 Other specified enthesopathies of unspecified lower limb, excluding foot: Secondary | ICD-10-CM | POA: Diagnosis not present

## 2013-01-30 DIAGNOSIS — F329 Major depressive disorder, single episode, unspecified: Secondary | ICD-10-CM | POA: Diagnosis not present

## 2013-01-30 DIAGNOSIS — E785 Hyperlipidemia, unspecified: Secondary | ICD-10-CM | POA: Diagnosis not present

## 2013-01-30 DIAGNOSIS — M1990 Unspecified osteoarthritis, unspecified site: Secondary | ICD-10-CM | POA: Diagnosis not present

## 2013-01-30 DIAGNOSIS — Z6825 Body mass index (BMI) 25.0-25.9, adult: Secondary | ICD-10-CM | POA: Diagnosis not present

## 2013-01-30 DIAGNOSIS — I10 Essential (primary) hypertension: Secondary | ICD-10-CM | POA: Diagnosis not present

## 2013-01-30 DIAGNOSIS — K219 Gastro-esophageal reflux disease without esophagitis: Secondary | ICD-10-CM | POA: Diagnosis not present

## 2013-02-27 DIAGNOSIS — M169 Osteoarthritis of hip, unspecified: Secondary | ICD-10-CM | POA: Diagnosis not present

## 2013-02-27 DIAGNOSIS — M25559 Pain in unspecified hip: Secondary | ICD-10-CM | POA: Diagnosis not present

## 2013-03-05 DIAGNOSIS — M545 Low back pain: Secondary | ICD-10-CM | POA: Diagnosis not present

## 2013-03-08 DIAGNOSIS — Z23 Encounter for immunization: Secondary | ICD-10-CM | POA: Diagnosis not present

## 2013-03-19 DIAGNOSIS — M25559 Pain in unspecified hip: Secondary | ICD-10-CM | POA: Diagnosis not present

## 2013-04-03 ENCOUNTER — Other Ambulatory Visit: Payer: Self-pay | Admitting: Neurological Surgery

## 2013-04-03 DIAGNOSIS — IMO0002 Reserved for concepts with insufficient information to code with codable children: Secondary | ICD-10-CM | POA: Diagnosis not present

## 2013-04-03 DIAGNOSIS — M5416 Radiculopathy, lumbar region: Secondary | ICD-10-CM

## 2013-04-04 DIAGNOSIS — Z1231 Encounter for screening mammogram for malignant neoplasm of breast: Secondary | ICD-10-CM | POA: Diagnosis not present

## 2013-04-10 DIAGNOSIS — I872 Venous insufficiency (chronic) (peripheral): Secondary | ICD-10-CM | POA: Diagnosis not present

## 2013-04-10 DIAGNOSIS — I809 Phlebitis and thrombophlebitis of unspecified site: Secondary | ICD-10-CM | POA: Diagnosis not present

## 2013-04-10 DIAGNOSIS — I10 Essential (primary) hypertension: Secondary | ICD-10-CM | POA: Diagnosis not present

## 2013-04-10 DIAGNOSIS — Z6825 Body mass index (BMI) 25.0-25.9, adult: Secondary | ICD-10-CM | POA: Diagnosis not present

## 2013-04-13 ENCOUNTER — Ambulatory Visit
Admission: RE | Admit: 2013-04-13 | Discharge: 2013-04-13 | Disposition: A | Discharge status: Home / Self Care | Payer: Medicare Other | Source: Ambulatory Visit | Attending: Neurological Surgery | Admitting: Neurological Surgery

## 2013-04-13 DIAGNOSIS — M4120 Other idiopathic scoliosis, site unspecified: Secondary | ICD-10-CM | POA: Diagnosis not present

## 2013-04-13 DIAGNOSIS — M5416 Radiculopathy, lumbar region: Secondary | ICD-10-CM

## 2013-04-13 DIAGNOSIS — M48061 Spinal stenosis, lumbar region without neurogenic claudication: Secondary | ICD-10-CM | POA: Diagnosis not present

## 2013-04-18 DIAGNOSIS — IMO0002 Reserved for concepts with insufficient information to code with codable children: Secondary | ICD-10-CM | POA: Diagnosis not present

## 2013-04-19 DIAGNOSIS — M25559 Pain in unspecified hip: Secondary | ICD-10-CM | POA: Diagnosis not present

## 2013-04-26 DIAGNOSIS — M47817 Spondylosis without myelopathy or radiculopathy, lumbosacral region: Secondary | ICD-10-CM | POA: Diagnosis not present

## 2013-04-26 DIAGNOSIS — IMO0002 Reserved for concepts with insufficient information to code with codable children: Secondary | ICD-10-CM | POA: Diagnosis not present

## 2013-05-02 DIAGNOSIS — H409 Unspecified glaucoma: Secondary | ICD-10-CM | POA: Diagnosis not present

## 2013-05-02 DIAGNOSIS — Z961 Presence of intraocular lens: Secondary | ICD-10-CM | POA: Diagnosis not present

## 2013-05-02 DIAGNOSIS — H259 Unspecified age-related cataract: Secondary | ICD-10-CM | POA: Diagnosis not present

## 2013-05-02 DIAGNOSIS — H4011X0 Primary open-angle glaucoma, stage unspecified: Secondary | ICD-10-CM | POA: Diagnosis not present

## 2013-05-22 DIAGNOSIS — IMO0002 Reserved for concepts with insufficient information to code with codable children: Secondary | ICD-10-CM | POA: Diagnosis not present

## 2013-05-22 DIAGNOSIS — F329 Major depressive disorder, single episode, unspecified: Secondary | ICD-10-CM | POA: Diagnosis not present

## 2013-05-22 DIAGNOSIS — Q762 Congenital spondylolisthesis: Secondary | ICD-10-CM | POA: Diagnosis not present

## 2013-05-22 DIAGNOSIS — E785 Hyperlipidemia, unspecified: Secondary | ICD-10-CM | POA: Diagnosis not present

## 2013-05-22 DIAGNOSIS — M1990 Unspecified osteoarthritis, unspecified site: Secondary | ICD-10-CM | POA: Diagnosis not present

## 2013-05-22 DIAGNOSIS — K219 Gastro-esophageal reflux disease without esophagitis: Secondary | ICD-10-CM | POA: Diagnosis not present

## 2013-05-22 DIAGNOSIS — I10 Essential (primary) hypertension: Secondary | ICD-10-CM | POA: Diagnosis not present

## 2013-06-13 DIAGNOSIS — M1612 Unilateral primary osteoarthritis, left hip: Secondary | ICD-10-CM

## 2013-06-13 HISTORY — DX: Unilateral primary osteoarthritis, left hip: M16.12

## 2013-06-21 DIAGNOSIS — R809 Proteinuria, unspecified: Secondary | ICD-10-CM | POA: Diagnosis not present

## 2013-06-21 DIAGNOSIS — R5381 Other malaise: Secondary | ICD-10-CM | POA: Diagnosis not present

## 2013-06-21 DIAGNOSIS — F3289 Other specified depressive episodes: Secondary | ICD-10-CM | POA: Diagnosis not present

## 2013-06-21 DIAGNOSIS — R82998 Other abnormal findings in urine: Secondary | ICD-10-CM | POA: Diagnosis not present

## 2013-06-21 DIAGNOSIS — I10 Essential (primary) hypertension: Secondary | ICD-10-CM | POA: Diagnosis not present

## 2013-06-21 DIAGNOSIS — F329 Major depressive disorder, single episode, unspecified: Secondary | ICD-10-CM | POA: Diagnosis not present

## 2013-06-21 DIAGNOSIS — R5383 Other fatigue: Secondary | ICD-10-CM | POA: Diagnosis not present

## 2013-06-24 DIAGNOSIS — M48061 Spinal stenosis, lumbar region without neurogenic claudication: Secondary | ICD-10-CM | POA: Diagnosis not present

## 2013-06-28 ENCOUNTER — Other Ambulatory Visit: Payer: Self-pay | Admitting: Neurological Surgery

## 2013-07-09 ENCOUNTER — Encounter (HOSPITAL_COMMUNITY)
Admission: RE | Admit: 2013-07-09 | Discharge: 2013-07-09 | Disposition: A | Discharge status: Home / Self Care | Payer: Medicare Other | Source: Ambulatory Visit | Attending: Neurological Surgery | Admitting: Neurological Surgery

## 2013-07-09 ENCOUNTER — Encounter (HOSPITAL_COMMUNITY): Payer: Self-pay

## 2013-07-09 DIAGNOSIS — Z01812 Encounter for preprocedural laboratory examination: Secondary | ICD-10-CM | POA: Insufficient documentation

## 2013-07-09 DIAGNOSIS — Z01818 Encounter for other preprocedural examination: Secondary | ICD-10-CM | POA: Diagnosis not present

## 2013-07-09 DIAGNOSIS — Z01810 Encounter for preprocedural cardiovascular examination: Secondary | ICD-10-CM | POA: Diagnosis not present

## 2013-07-09 HISTORY — DX: Personal history of colonic polyps: Z86.010

## 2013-07-09 HISTORY — DX: Dorsalgia, unspecified: M54.9

## 2013-07-09 HISTORY — DX: Depression, unspecified: F32.A

## 2013-07-09 HISTORY — DX: Unspecified cataract: H26.9

## 2013-07-09 HISTORY — DX: Pain in unspecified joint: M25.50

## 2013-07-09 HISTORY — DX: Major depressive disorder, single episode, unspecified: F32.9

## 2013-07-09 HISTORY — DX: Gastro-esophageal reflux disease without esophagitis: K21.9

## 2013-07-09 HISTORY — DX: Personal history of colon polyps, unspecified: Z86.0100

## 2013-07-09 HISTORY — DX: Anemia, unspecified: D64.9

## 2013-07-09 HISTORY — DX: Nocturia: R35.1

## 2013-07-09 HISTORY — DX: Spontaneous ecchymoses: R23.3

## 2013-07-09 HISTORY — DX: Insomnia, unspecified: G47.00

## 2013-07-09 HISTORY — DX: Other specified postprocedural states: Z98.890

## 2013-07-09 HISTORY — DX: Personal history of other diseases of the respiratory system: Z87.09

## 2013-07-09 HISTORY — DX: Effusion, unspecified joint: M25.40

## 2013-07-09 HISTORY — DX: Unspecified glaucoma: H40.9

## 2013-07-09 HISTORY — DX: Nausea with vomiting, unspecified: R11.2

## 2013-07-09 HISTORY — DX: Other skin changes: R23.8

## 2013-07-09 HISTORY — DX: Essential (primary) hypertension: I10

## 2013-07-09 HISTORY — DX: Unspecified osteoarthritis, unspecified site: M19.90

## 2013-07-09 HISTORY — DX: Headache: R51

## 2013-07-09 HISTORY — DX: Other chronic pain: G89.29

## 2013-07-09 LAB — CBC WITH DIFFERENTIAL/PLATELET
Basophils Absolute: 0.1 10*3/uL (ref 0.0–0.1)
Basophils Relative: 1 % (ref 0–1)
EOS PCT: 4 % (ref 0–5)
Eosinophils Absolute: 0.4 10*3/uL (ref 0.0–0.7)
HEMATOCRIT: 37.3 % (ref 36.0–46.0)
Hemoglobin: 12.3 g/dL (ref 12.0–15.0)
LYMPHS ABS: 1.7 10*3/uL (ref 0.7–4.0)
Lymphocytes Relative: 15 % (ref 12–46)
MCH: 30.4 pg (ref 26.0–34.0)
MCHC: 33 g/dL (ref 30.0–36.0)
MCV: 92.1 fL (ref 78.0–100.0)
Monocytes Absolute: 1 10*3/uL (ref 0.1–1.0)
Monocytes Relative: 9 % (ref 3–12)
NEUTROS PCT: 72 % (ref 43–77)
Neutro Abs: 8.2 10*3/uL — ABNORMAL HIGH (ref 1.7–7.7)
Platelets: 342 10*3/uL (ref 150–400)
RBC: 4.05 MIL/uL (ref 3.87–5.11)
RDW: 13 % (ref 11.5–15.5)
WBC: 11.3 10*3/uL — ABNORMAL HIGH (ref 4.0–10.5)

## 2013-07-09 LAB — PROTIME-INR
INR: 1.01 (ref 0.00–1.49)
Prothrombin Time: 13.1 seconds (ref 11.6–15.2)

## 2013-07-09 LAB — SURGICAL PCR SCREEN
MRSA, PCR: NEGATIVE
Staphylococcus aureus: NEGATIVE

## 2013-07-09 LAB — BASIC METABOLIC PANEL
BUN: 20 mg/dL (ref 6–23)
CHLORIDE: 99 meq/L (ref 96–112)
CO2: 27 meq/L (ref 19–32)
Calcium: 9.6 mg/dL (ref 8.4–10.5)
Creatinine, Ser: 1.31 mg/dL — ABNORMAL HIGH (ref 0.50–1.10)
GFR calc Af Amer: 42 mL/min — ABNORMAL LOW (ref >90)
GFR calc non Af Amer: 36 mL/min — ABNORMAL LOW (ref >90)
GLUCOSE: 111 mg/dL — AB (ref 70–99)
POTASSIUM: 4.5 meq/L (ref 3.7–5.3)
SODIUM: 140 meq/L (ref 137–147)

## 2013-07-09 NOTE — Progress Notes (Signed)
Pt doesn't have a cardiologist   Stress test done at least 15-66yrs ago  Denies ever having an echo or heart cath  CXR done within past yr with Dr.Richard Arronson with [facility]  Denies EKG in past yr

## 2013-07-09 NOTE — Pre-Procedure Instructions (Signed)
TESHIA MAHONE  07/09/2013   Your procedure is scheduled on:  Fri, Feb 6 @ 7:30 AM  Report to [facility]  at 5:30 AM.  Call this number if you have problems the morning of surgery: [phone]   Remember:   Do not eat food or drink liquids after midnight.   Take these medicines the morning of surgery with A SIP OF WATER: Escitalopram(Lexapro),Nebivolol(Bystolic),Pantoprazole(Protonix),and Tramadol(Ultram-if needed)              Stop taking your Naproxen 7 days prior to surgery. No Goody's,BC's,Aspirin,Ibuprofen,Fish Oil,or any Herbal Medications   Do not wear jewelry, make-up or nail polish.  Do not wear lotions, powders, or perfumes. You may wear deodorant.  Do not shave 48 hours prior to surgery.   Do not bring valuables to the hospital.  [facility] is not responsible                  for any belongings or valuables.               Contacts, dentures or bridgework may not be worn into surgery.  Leave suitcase in the car. After surgery it may be brought to your room.  For patients admitted to the hospital, discharge time is determined by your                treatment team.               Patients discharged the day of surgery will not be allowed to drive  home.    Special Instructions: Shower using CHG 2 nights before surgery and the night before surgery.  If you shower the day of surgery use CHG.  Use special wash - you have one bottle of CHG for all showers.  You should use approximately 1/3 of the bottle for each shower.   Please read over the following fact sheets that you were given: Pain Booklet, Coughing and Deep Breathing, MRSA Information and Surgical Site Infection Prevention

## 2013-07-18 MED ORDER — CEFAZOLIN SODIUM-DEXTROSE 2-3 GM-% IV SOLR
2.0000 g | INTRAVENOUS | Status: AC
  Administered 2013-07-19: 2 g via INTRAVENOUS
  Filled 2013-07-18: qty 50

## 2013-07-18 NOTE — Progress Notes (Signed)
Spoke with pts daughter Alger Simons and informed her of time change and arrival time of 0630 with stated understanding.

## 2013-07-19 ENCOUNTER — Encounter (HOSPITAL_COMMUNITY): Payer: Medicare Other | Admitting: Anesthesiology

## 2013-07-19 ENCOUNTER — Inpatient Hospital Stay (HOSPITAL_COMMUNITY): Payer: Medicare Other

## 2013-07-19 ENCOUNTER — Encounter (HOSPITAL_COMMUNITY): Payer: Self-pay | Admitting: Anesthesiology

## 2013-07-19 ENCOUNTER — Encounter (HOSPITAL_COMMUNITY)
Admission: RE | Disposition: A | Discharge status: Home / Self Care | Payer: Self-pay | Source: Ambulatory Visit | Attending: Neurological Surgery

## 2013-07-19 ENCOUNTER — Inpatient Hospital Stay (HOSPITAL_COMMUNITY)
Admission: RE | Admit: 2013-07-19 | Discharge: 2013-07-20 | DRG: 520 | Disposition: A | Discharge status: Home / Self Care | Payer: Medicare Other | Source: Ambulatory Visit | Attending: Neurological Surgery | Admitting: Neurological Surgery

## 2013-07-19 ENCOUNTER — Inpatient Hospital Stay (HOSPITAL_COMMUNITY): Payer: Medicare Other | Admitting: Anesthesiology

## 2013-07-19 DIAGNOSIS — Z79899 Other long term (current) drug therapy: Secondary | ICD-10-CM

## 2013-07-19 DIAGNOSIS — I10 Essential (primary) hypertension: Secondary | ICD-10-CM | POA: Diagnosis present

## 2013-07-19 DIAGNOSIS — M48061 Spinal stenosis, lumbar region without neurogenic claudication: Secondary | ICD-10-CM | POA: Diagnosis not present

## 2013-07-19 DIAGNOSIS — G8929 Other chronic pain: Secondary | ICD-10-CM | POA: Diagnosis present

## 2013-07-19 DIAGNOSIS — F3289 Other specified depressive episodes: Secondary | ICD-10-CM | POA: Diagnosis present

## 2013-07-19 DIAGNOSIS — F329 Major depressive disorder, single episode, unspecified: Secondary | ICD-10-CM | POA: Diagnosis present

## 2013-07-19 DIAGNOSIS — H409 Unspecified glaucoma: Secondary | ICD-10-CM | POA: Diagnosis present

## 2013-07-19 DIAGNOSIS — G4700 Insomnia, unspecified: Secondary | ICD-10-CM | POA: Diagnosis present

## 2013-07-19 DIAGNOSIS — Z860100 Personal history of colon polyps, unspecified: Secondary | ICD-10-CM

## 2013-07-19 DIAGNOSIS — Z888 Allergy status to other drugs, medicaments and biological substances status: Secondary | ICD-10-CM | POA: Diagnosis not present

## 2013-07-19 DIAGNOSIS — Z9889 Other specified postprocedural states: Secondary | ICD-10-CM

## 2013-07-19 DIAGNOSIS — Z96649 Presence of unspecified artificial hip joint: Secondary | ICD-10-CM

## 2013-07-19 DIAGNOSIS — K219 Gastro-esophageal reflux disease without esophagitis: Secondary | ICD-10-CM | POA: Diagnosis not present

## 2013-07-19 DIAGNOSIS — Z9089 Acquired absence of other organs: Secondary | ICD-10-CM | POA: Diagnosis not present

## 2013-07-19 DIAGNOSIS — M545 Low back pain, unspecified: Secondary | ICD-10-CM | POA: Diagnosis not present

## 2013-07-19 DIAGNOSIS — M539 Dorsopathy, unspecified: Secondary | ICD-10-CM | POA: Diagnosis not present

## 2013-07-19 HISTORY — PX: LUMBAR LAMINECTOMY/DECOMPRESSION MICRODISCECTOMY: SHX5026

## 2013-07-19 SURGERY — LUMBAR LAMINECTOMY/DECOMPRESSION MICRODISCECTOMY 2 LEVELS
Anesthesia: General | Site: Back | Laterality: Left

## 2013-07-19 MED ORDER — PHENOL 1.4 % MT LIQD: 1.0000 | OROMUCOSAL | Status: DC | PRN

## 2013-07-19 MED ORDER — ONDANSETRON HCL 4 MG/2ML IJ SOLN
INTRAMUSCULAR | Status: DC | PRN
Start: 2013-07-19 — End: 2013-07-19
  Administered 2013-07-19: 4 mg via INTRAVENOUS

## 2013-07-19 MED ORDER — GLYCOPYRROLATE 0.2 MG/ML IJ SOLN
INTRAMUSCULAR | Status: AC
  Filled 2013-07-19: qty 1

## 2013-07-19 MED ORDER — ROCURONIUM BROMIDE 100 MG/10ML IV SOLN
INTRAVENOUS | Status: DC | PRN
  Administered 2013-07-19: 40 mg via INTRAVENOUS

## 2013-07-19 MED ORDER — LACTATED RINGERS IV SOLN
INTRAVENOUS | Status: DC
  Administered 2013-07-19: 08:00:00 via INTRAVENOUS

## 2013-07-19 MED ORDER — HEMOSTATIC AGENTS (NO CHARGE) OPTIME
TOPICAL | Status: DC | PRN
  Administered 2013-07-19: 1 via TOPICAL

## 2013-07-19 MED ORDER — ROCURONIUM BROMIDE 50 MG/5ML IV SOLN
INTRAVENOUS | Status: AC
  Filled 2013-07-19: qty 1

## 2013-07-19 MED ORDER — PANTOPRAZOLE SODIUM 40 MG PO TBEC: 40.0000 mg | DELAYED_RELEASE_TABLET | Freq: Every day | ORAL | Status: DC

## 2013-07-19 MED ORDER — NEBIVOLOL HCL 10 MG PO TABS
10.0000 mg | ORAL_TABLET | Freq: Every day | ORAL | Status: DC
  Filled 2013-07-19: qty 1

## 2013-07-19 MED ORDER — MIDAZOLAM HCL 2 MG/2ML IJ SOLN
INTRAMUSCULAR | Status: AC
  Filled 2013-07-19: qty 2

## 2013-07-19 MED ORDER — ACETAMINOPHEN 650 MG RE SUPP: 650.0000 mg | RECTAL | Status: DC | PRN

## 2013-07-19 MED ORDER — THROMBIN 5000 UNITS EX SOLR
CUTANEOUS | Status: DC | PRN
  Administered 2013-07-19 (×2): 5000 [IU] via TOPICAL

## 2013-07-19 MED ORDER — NEOSTIGMINE METHYLSULFATE 1 MG/ML IJ SOLN
INTRAMUSCULAR | Status: DC | PRN
  Administered 2013-07-19: 4 mg via INTRAVENOUS

## 2013-07-19 MED ORDER — 0.9 % SODIUM CHLORIDE (POUR BTL) OPTIME
TOPICAL | Status: DC | PRN
  Administered 2013-07-19: 1000 mL

## 2013-07-19 MED ORDER — IRBESARTAN 150 MG PO TABS
150.0000 mg | ORAL_TABLET | Freq: Every day | ORAL | Status: DC
  Filled 2013-07-19 (×2): qty 1

## 2013-07-19 MED ORDER — ARTIFICIAL TEARS OP OINT
TOPICAL_OINTMENT | OPHTHALMIC | Status: DC | PRN
  Administered 2013-07-19: 1 via OPHTHALMIC

## 2013-07-19 MED ORDER — LIDOCAINE HCL (CARDIAC) 20 MG/ML IV SOLN
INTRAVENOUS | Status: DC | PRN
  Administered 2013-07-19: 75 mg via INTRAVENOUS

## 2013-07-19 MED ORDER — PROPOFOL 10 MG/ML IV BOLUS
INTRAVENOUS | Status: AC
  Filled 2013-07-19: qty 20

## 2013-07-19 MED ORDER — DEXAMETHASONE SODIUM PHOSPHATE 10 MG/ML IJ SOLN: 10.0000 mg | INTRAMUSCULAR | Status: DC

## 2013-07-19 MED ORDER — SODIUM CHLORIDE 0.9 % IV SOLN: 250.0000 mL | INTRAVENOUS | Status: DC

## 2013-07-19 MED ORDER — MIDAZOLAM HCL 5 MG/5ML IJ SOLN
INTRAMUSCULAR | Status: DC | PRN
  Administered 2013-07-19: 1 mg via INTRAVENOUS

## 2013-07-19 MED ORDER — CEFAZOLIN SODIUM 1-5 GM-% IV SOLN
1.0000 g | Freq: Three times a day (TID) | INTRAVENOUS | Status: AC
  Administered 2013-07-19 – 2013-07-20 (×2): 1 g via INTRAVENOUS
  Filled 2013-07-19 (×2): qty 50

## 2013-07-19 MED ORDER — ESCITALOPRAM OXALATE 10 MG PO TABS
10.0000 mg | ORAL_TABLET | Freq: Every day | ORAL | Status: DC
  Filled 2013-07-19: qty 1

## 2013-07-19 MED ORDER — HYDROMORPHONE HCL PF 1 MG/ML IJ SOLN: 0.5000 mg | INTRAMUSCULAR | Status: DC | PRN

## 2013-07-19 MED ORDER — BUPIVACAINE HCL (PF) 0.25 % IJ SOLN
INTRAMUSCULAR | Status: DC | PRN
  Administered 2013-07-19: 9 mL

## 2013-07-19 MED ORDER — ACETAMINOPHEN 325 MG PO TABS: 650.0000 mg | ORAL_TABLET | ORAL | Status: DC | PRN

## 2013-07-19 MED ORDER — CYCLOBENZAPRINE HCL 10 MG PO TABS: 5.0000 mg | ORAL_TABLET | Freq: Three times a day (TID) | ORAL | Status: DC | PRN

## 2013-07-19 MED ORDER — OXYCODONE-ACETAMINOPHEN 5-325 MG PO TABS
1.0000 | ORAL_TABLET | ORAL | Status: DC | PRN
  Administered 2013-07-19: 1 via ORAL
  Administered 2013-07-20: 2 via ORAL
  Filled 2013-07-19: qty 2

## 2013-07-19 MED ORDER — LACTATED RINGERS IV SOLN
INTRAVENOUS | Status: DC | PRN
  Administered 2013-07-19: 08:00:00 via INTRAVENOUS

## 2013-07-19 MED ORDER — VALSARTAN-HYDROCHLOROTHIAZIDE 160-12.5 MG PO TABS: 1.0000 | ORAL_TABLET | Freq: Every day | ORAL | Status: DC

## 2013-07-19 MED ORDER — SODIUM CHLORIDE 0.9 % IR SOLN
Status: DC | PRN
  Administered 2013-07-19: 08:00:00

## 2013-07-19 MED ORDER — PROPOFOL 10 MG/ML IV BOLUS
INTRAVENOUS | Status: DC | PRN
  Administered 2013-07-19: 150 mg via INTRAVENOUS

## 2013-07-19 MED ORDER — ONDANSETRON HCL 4 MG/2ML IJ SOLN: 4.0000 mg | INTRAMUSCULAR | Status: DC | PRN

## 2013-07-19 MED ORDER — LIDOCAINE HCL (CARDIAC) 20 MG/ML IV SOLN
INTRAVENOUS | Status: AC
  Filled 2013-07-19: qty 5

## 2013-07-19 MED ORDER — GLYCOPYRROLATE 0.2 MG/ML IJ SOLN
INTRAMUSCULAR | Status: DC | PRN
  Administered 2013-07-19: 0.6 mg via INTRAVENOUS

## 2013-07-19 MED ORDER — SODIUM CHLORIDE 0.9 % IJ SOLN: 3.0000 mL | INTRAMUSCULAR | Status: DC | PRN

## 2013-07-19 MED ORDER — ZOLPIDEM TARTRATE 5 MG PO TABS
5.0000 mg | ORAL_TABLET | Freq: Every evening | ORAL | Status: DC | PRN
  Administered 2013-07-20: 5 mg via ORAL
  Filled 2013-07-19: qty 1

## 2013-07-19 MED ORDER — ONDANSETRON HCL 4 MG/2ML IJ SOLN
INTRAMUSCULAR | Status: AC
  Filled 2013-07-19: qty 2

## 2013-07-19 MED ORDER — POTASSIUM CHLORIDE IN NACL 20-0.9 MEQ/L-% IV SOLN
INTRAVENOUS | Status: DC
  Filled 2013-07-19 (×4): qty 1000

## 2013-07-19 MED ORDER — MENTHOL 3 MG MT LOZG: 1.0000 | LOZENGE | OROMUCOSAL | Status: DC | PRN

## 2013-07-19 MED ORDER — DEXAMETHASONE SODIUM PHOSPHATE 10 MG/ML IJ SOLN
INTRAMUSCULAR | Status: AC
  Administered 2013-07-19: 10 mg via INTRAVENOUS
  Filled 2013-07-19: qty 1

## 2013-07-19 MED ORDER — TRAMADOL HCL 50 MG PO TABS: 50.0000 mg | ORAL_TABLET | Freq: Four times a day (QID) | ORAL | Status: DC | PRN

## 2013-07-19 MED ORDER — NEOSTIGMINE METHYLSULFATE 1 MG/ML IJ SOLN
INTRAMUSCULAR | Status: AC
  Filled 2013-07-19: qty 10

## 2013-07-19 MED ORDER — OXYCODONE-ACETAMINOPHEN 5-325 MG PO TABS
ORAL_TABLET | ORAL | Status: AC
  Filled 2013-07-19: qty 1

## 2013-07-19 MED ORDER — PHENYLEPHRINE HCL 10 MG/ML IJ SOLN
10.0000 mg | INTRAVENOUS | Status: DC | PRN
  Administered 2013-07-19: 10 ug/min via INTRAVENOUS

## 2013-07-19 MED ORDER — FENTANYL CITRATE 0.05 MG/ML IJ SOLN
INTRAMUSCULAR | Status: DC | PRN
  Administered 2013-07-19: 50 ug via INTRAVENOUS
  Administered 2013-07-19 (×2): 75 ug via INTRAVENOUS

## 2013-07-19 MED ORDER — HYDROCHLOROTHIAZIDE 12.5 MG PO CAPS
12.5000 mg | ORAL_CAPSULE | Freq: Every day | ORAL | Status: DC
  Filled 2013-07-19 (×2): qty 1

## 2013-07-19 MED ORDER — SODIUM CHLORIDE 0.9 % IJ SOLN
3.0000 mL | Freq: Two times a day (BID) | INTRAMUSCULAR | Status: DC
  Administered 2013-07-20: 3 mL via INTRAVENOUS

## 2013-07-19 MED ORDER — LATANOPROST 0.005 % OP SOLN
1.0000 [drp] | Freq: Every day | OPHTHALMIC | Status: DC
  Administered 2013-07-19: 1 [drp] via OPHTHALMIC
  Filled 2013-07-19: qty 2.5

## 2013-07-19 MED ORDER — FENTANYL CITRATE 0.05 MG/ML IJ SOLN
INTRAMUSCULAR | Status: AC
  Filled 2013-07-19: qty 5

## 2013-07-19 SURGICAL SUPPLY — 49 items
APL SKNCLS STERI-STRIP NONHPOA (GAUZE/BANDAGES/DRESSINGS) ×1
BAG DECANTER FOR FLEXI CONT (MISCELLANEOUS) ×2 IMPLANT
BENZOIN TINCTURE PRP APPL 2/3 (GAUZE/BANDAGES/DRESSINGS) ×2 IMPLANT
BUR MATCHSTICK NEURO 3.0 LAGG (BURR) ×2 IMPLANT
CANISTER SUCT 3000ML (MISCELLANEOUS) ×2 IMPLANT
CONT SPEC 4OZ CLIKSEAL STRL BL (MISCELLANEOUS) ×2 IMPLANT
DRAPE LAPAROTOMY 100X72X124 (DRAPES) ×2 IMPLANT
DRAPE MICROSCOPE ZEISS OPMI (DRAPES) ×2 IMPLANT
DRAPE POUCH INSTRU U-SHP 10X18 (DRAPES) ×2 IMPLANT
DRAPE SURG 17X23 STRL (DRAPES) ×2 IMPLANT
DRESSING TELFA 8X3 (GAUZE/BANDAGES/DRESSINGS) ×2 IMPLANT
DRSG OPSITE 4X5.5 SM (GAUZE/BANDAGES/DRESSINGS) ×2 IMPLANT
DRSG OPSITE POSTOP 3X4 (GAUZE/BANDAGES/DRESSINGS) ×1 IMPLANT
DURAPREP 26ML APPLICATOR (WOUND CARE) ×2 IMPLANT
ELECT REM PT RETURN 9FT ADLT (ELECTROSURGICAL) ×2
ELECTRODE REM PT RTRN 9FT ADLT (ELECTROSURGICAL) ×1 IMPLANT
GAUZE SPONGE 4X4 16PLY XRAY LF (GAUZE/BANDAGES/DRESSINGS) IMPLANT
GLOVE BIO SURGEON STRL SZ8 (GLOVE) ×2 IMPLANT
GLOVE ECLIPSE 7.5 STRL STRAW (GLOVE) ×3 IMPLANT
GLOVE ECLIPSE 8.5 STRL (GLOVE) ×1 IMPLANT
GLOVE INDICATOR 8.0 STRL GRN (GLOVE) ×1 IMPLANT
GLOVE INDICATOR 8.5 STRL (GLOVE) ×1 IMPLANT
GOWN BRE IMP SLV AUR LG STRL (GOWN DISPOSABLE) IMPLANT
GOWN BRE IMP SLV AUR XL STRL (GOWN DISPOSABLE) ×1 IMPLANT
GOWN SPEC L3 XXLG W/TWL (GOWN DISPOSABLE) ×2 IMPLANT
GOWN STRL REIN 2XL LVL4 (GOWN DISPOSABLE) IMPLANT
GOWN STRL REUS W/ TWL XL LVL3 (GOWN DISPOSABLE) IMPLANT
GOWN STRL REUS W/TWL XL LVL3 (GOWN DISPOSABLE) ×2
HEMOSTAT POWDER KIT SURGIFOAM (HEMOSTASIS) IMPLANT
KIT BASIN OR (CUSTOM PROCEDURE TRAY) ×2 IMPLANT
KIT ROOM TURNOVER OR (KITS) ×2 IMPLANT
NDL HYPO 25X1 1.5 SAFETY (NEEDLE) ×1 IMPLANT
NDL SPNL 20GX3.5 QUINCKE YW (NEEDLE) IMPLANT
NEEDLE HYPO 25X1 1.5 SAFETY (NEEDLE) ×2 IMPLANT
NEEDLE SPNL 20GX3.5 QUINCKE YW (NEEDLE) IMPLANT
NS IRRIG 1000ML POUR BTL (IV SOLUTION) ×2 IMPLANT
PACK LAMINECTOMY NEURO (CUSTOM PROCEDURE TRAY) ×2 IMPLANT
PAD ARMBOARD 7.5X6 YLW CONV (MISCELLANEOUS) ×6 IMPLANT
RUBBERBAND STERILE (MISCELLANEOUS) ×4 IMPLANT
SPONGE SURGIFOAM ABS GEL SZ50 (HEMOSTASIS) ×2 IMPLANT
STRIP CLOSURE SKIN 1/2X4 (GAUZE/BANDAGES/DRESSINGS) ×2 IMPLANT
SUT VIC AB 0 CT1 18XCR BRD8 (SUTURE) ×1 IMPLANT
SUT VIC AB 0 CT1 8-18 (SUTURE) ×4
SUT VIC AB 2-0 CP2 18 (SUTURE) ×3 IMPLANT
SUT VIC AB 3-0 SH 8-18 (SUTURE) ×3 IMPLANT
SYR 20ML ECCENTRIC (SYRINGE) ×2 IMPLANT
TOWEL OR 17X24 6PK STRL BLUE (TOWEL DISPOSABLE) ×2 IMPLANT
TOWEL OR 17X26 10 PK STRL BLUE (TOWEL DISPOSABLE) ×2 IMPLANT
WATER STERILE IRR 1000ML POUR (IV SOLUTION) ×2 IMPLANT

## 2013-07-19 NOTE — Anesthesia Procedure Notes (Signed)
Procedure Name: Intubation Date/Time: 07/19/2013 8:50 AM Performed by: Wanita Chamberlain Pre-anesthesia Checklist: Patient identified, Timeout performed, Emergency Drugs available, Suction available and Patient being monitored Patient Re-evaluated:Patient Re-evaluated prior to inductionOxygen Delivery Method: Circle system utilized Preoxygenation: Pre-oxygenation with 100% oxygen Intubation Type: IV induction Ventilation: Mask ventilation without difficulty Laryngoscope Size: Mac and 3 Grade View: Grade II Tube type: Oral Tube size: 7.5 mm Number of attempts: 1 Airway Equipment and Method: Stylet Placement Confirmation: ETT inserted through vocal cords under direct vision,  positive ETCO2 and breath sounds checked- equal and bilateral Secured at: 22 cm Tube secured with: Tape Dental Injury: Teeth and Oropharynx as per pre-operative assessment

## 2013-07-19 NOTE — Op Note (Signed)
07/19/2013  10:12 AM  PATIENT:  Martha Hendrix  80 y.o. female  PRE-OPERATIVE DIAGNOSIS:  Lumbar spinal stenosis L3-4, L4-5 Left with left leg pain  POST-OPERATIVE DIAGNOSIS:  same  PROCEDURE:  Left L3-4 and L4-5 hemilaminectomy, medial facetectomy, and foraminotomies  SURGEON:  Sherley Bounds, MD  ASSISTANTS: After Elsner  ANESTHESIA:   General  EBL: Less than 50 ml     BLOOD ADMINISTERED:none  DRAINS: None   SPECIMEN:  No Specimen  INDICATION FOR PROCEDURE: This patient presented with a long history of left leg pain. MRI and plain from showed significant scoliosis but she had left lateral recess stenosis at L3-4 and L4-5. She tried medical management without relief. Recommended a decompressive hemilaminectomy at L3-4 L4-5 the left. Patient understood the risks, benefits, and alternatives and potential outcomes and wished to proceed.  PROCEDURE DETAILS: The patient was taken to the operating room and after induction of adequate generalized endotracheal anesthesia, the patient was rolled into the prone position on the Wilson frame and all pressure points were padded. The lumbar region was cleaned and then prepped with DuraPrep and draped in the usual sterile fashion. 5 cc of local anesthesia was injected and then a dorsal midline incision was made and carried down to the lumbo sacral fascia. The fascia was opened and the paraspinous musculature was taken down in a subperiosteal fashion to expose L3-4 and L4-5 on the left. Intraoperative x-ray confirmed my level, and then I used a combination of the high-speed drill and the Kerrison punches to perform a hemilaminectomy, medial facetectomy, and foraminotomy at L3-4 and L4-5 on the left. The underlying yellow ligament was opened and removed in a piecemeal fashion to expose the underlying dura and exiting nerve root. I undercut the lateral recess and dissected down until I was medial to and distal to the pedicle. The nerve root was well  decompressed. We then gently retracted the nerve root medially with a retractor, coagulated the epidural venous vasculature, and inspected the disc space and found no sniff and disc herniation. I then palpated with a coronary dilator along the nerve root and into the foramen to assure adequate decompression. I felt no more compression of the nerve root at either level. I irrigated with saline solution containing bacitracin. Achieved hemostasis with bipolar cautery, lined the dura with Gelfoam, and then closed the fascia with 0 Vicryl. I closed the subcutaneous tissues with 2-0 Vicryl and the subcuticular tissues with 3-0 Vicryl. The skin was then closed with benzoin and Steri-Strips. The drapes were removed, a sterile dressing was applied. The patient was awakened from general anesthesia and transferred to the recovery room in stable condition. At the end of the procedure all sponge, needle and instrument counts were correct.   PLAN OF CARE: Admit to inpatient   PATIENT DISPOSITION:  PACU - hemodynamically stable.   Delay start of Pharmacological VTE agent (>24hrs) due to surgical blood loss or risk of bleeding:  yes

## 2013-07-19 NOTE — Preoperative (Addendum)
Beta Blockers   Pt took Bystolic @ 7915 this am

## 2013-07-19 NOTE — Anesthesia Postprocedure Evaluation (Signed)
  Anesthesia Post-op Note  Patient: Martha Hendrix  Procedure(s) Performed: Procedure(s): LUMBAR LAMINECTOMY/DECOMPRESSION MICRODISCECTOMY LUMBAR THREE-FOUR,LUMBAR FOUR-FIVE (Left)  Patient Location: PACU  Anesthesia Type:General  Level of Consciousness: awake and alert   Airway and Oxygen Therapy: Patient Spontanous Breathing  Post-op Pain: mild  Post-op Assessment: Post-op Vital signs reviewed  Post-op Vital Signs: stable  Complications: No apparent anesthesia complications

## 2013-07-19 NOTE — Transfer of Care (Signed)
Immediate Anesthesia Transfer of Care Note  Patient: Martha Hendrix  Procedure(s) Performed: Procedure(s): LUMBAR LAMINECTOMY/DECOMPRESSION MICRODISCECTOMY LUMBAR THREE-FOUR,LUMBAR FOUR-FIVE (Left)  Patient Location: PACU  Anesthesia Type:General  Level of Consciousness: awake, alert , oriented and patient cooperative  Airway & Oxygen Therapy: Patient Spontanous Breathing and Patient connected to nasal cannula oxygen  Post-op Assessment: Report given to PACU RN and Post -op Vital signs reviewed and stable  Post vital signs: Reviewed and stable  Complications: No apparent anesthesia complications

## 2013-07-19 NOTE — H&P (Signed)
Subjective: Patient is a 80 y.o. female admitted for decompressive laminectomy for stenosis. Onset of symptoms was several months ago, gradually worsening since that time.  The pain is rated severe, and is located at the across the lower back and radiates to left leg. The pain is described as aching and occurs intermittently. The symptoms have been progressive. Symptoms are exacerbated by exercise. MRI or CT showed spinal stenosis L3-4 and L4-5 on the left   Past Medical History  Diagnosis Date  . Hypertension     takes Diovan and Bystolic daily  . GERD (gastroesophageal reflux disease)     takes Protonix daily  . PONV (postoperative nausea and vomiting)   . History of bronchitis     last time unknown  . Headache(784.0)     occasionally  . Joint pain   . Joint swelling   . Chronic back pain     stenosis  . Arthritis     osteo  . Bruises easily     on both legs  . History of colon polyps   . Nocturia   . Anemia     as a child and never had a transfusion  . Cataract   . Glaucoma     uses eye drops  . Depression     takes Lexapro daily  . Insomnia     takes Ambien nightly    Past Surgical History  Procedure Laterality Date  . Left arm surgery      d/t break as child  . Appendectomy      as a child  . Joint replacement Right 2001    right  . Total hip arthroplasty Right   . Cataract surgery Left 2014  . Colonoscopy    . Esophagogastroduodenoscopy      with ED  . Epidural block injection      Prior to Admission medications   Medication Sig Start Date End Date Taking? Authorizing Provider  acetaminophen (TYLENOL) 500 MG tablet Take 1,000 mg by mouth 2 (two) times daily as needed (pain).   Yes Historical Provider, MD  Calcium-Vitamin D (CALTRATE 600 PLUS-VIT D PO) Take 1 tablet by mouth daily.   Yes Historical Provider, MD  escitalopram (LEXAPRO) 10 MG tablet Take 10 mg by mouth daily.   Yes Historical Provider, MD  latanoprost (XALATAN) 0.005 % ophthalmic solution  Place 1 drop into both eyes at bedtime.   Yes Historical Provider, MD  Multiple Vitamin (MULTIVITAMIN WITH MINERALS) TABS tablet Take 1 tablet by mouth daily. Centrum   Yes Historical Provider, MD  naproxen (NAPROSYN) 500 MG tablet Take 500 mg by mouth 2 (two) times daily as needed (pain).   Yes Historical Provider, MD  nebivolol (BYSTOLIC) 10 MG tablet Take 10 mg by mouth daily.   Yes Historical Provider, MD  pantoprazole (PROTONIX) 40 MG tablet Take 40 mg by mouth daily.   Yes Historical Provider, MD  traMADol (ULTRAM) 50 MG tablet Take 25-50 mg by mouth every 6 (six) hours as needed (pain).   Yes Historical Provider, MD  valsartan-hydrochlorothiazide (DIOVAN-HCT) 160-12.5 MG per tablet Take 1 tablet by mouth daily.   Yes Historical Provider, MD  zolpidem (AMBIEN) 10 MG tablet Take 10 mg by mouth at bedtime as needed for sleep.   Yes Historical Provider, MD   Allergies  Allergen Reactions  . Codeine Nausea And Vomiting and Other (See Comments)    "spacy feeling"  . Hydrocodone Nausea And Vomiting    spacey feeling  History  Substance Use Topics  . Smoking status: Never Smoker   . Smokeless tobacco: Not on file  . Alcohol Use: Yes     Comment: glass of wine every 3-60months    No family history on file.   Review of Systems  Positive ROS: neg  All other systems have been reviewed and were otherwise negative with the exception of those mentioned in the HPI and as above.  Objective: Vital signs in last 24 hours:    General Appearance: Alert, cooperative, no distress, appears stated age Head: Normocephalic, without obvious abnormality, atraumatic Eyes: PERRL, conjunctiva/corneas clear, EOM's intact    Neck: Supple, symmetrical, trachea midline Back: Symmetric, no curvature, ROM normal, no CVA tenderness Lungs:  respirations unlabored Heart: Regular rate and rhythm Abdomen: Soft, non-tender Extremities: Extremities normal, atraumatic, no cyanosis or edema Pulses: 2+ and  symmetric all extremities Skin: Skin color, texture, turgor normal, no rashes or lesions  NEUROLOGIC:   Mental status: Alert and oriented x4,  no aphasia, good attention span, fund of knowledge, and memory Motor Exam - grossly normal Sensory Exam - grossly normal Reflexes: trace Coordination - grossly normal Gait - grossly normal Balance - grossly normal Cranial Nerves: I: smell Not tested  II: visual acuity  OS: nl    OD: nl  II: visual fields Full to confrontation  II: pupils Equal, round, reactive to light  III,VII: ptosis None  III,IV,VI: extraocular muscles  Full ROM  V: mastication Normal  V: facial light touch sensation  Normal  V,VII: corneal reflex  Present  VII: facial muscle function - upper  Normal  VII: facial muscle function - lower Normal  VIII: hearing Not tested  IX: soft palate elevation  Normal  IX,X: gag reflex Present  XI: trapezius strength  5/5  XI: sternocleidomastoid strength 5/5  XI: neck flexion strength  5/5  XII: tongue strength  Normal    Data Review Lab Results  Component Value Date   WBC 11.3* 07/09/2013   HGB 12.3 07/09/2013   HCT 37.3 07/09/2013   MCV 92.1 07/09/2013   PLT 342 07/09/2013   Lab Results  Component Value Date   NA 140 07/09/2013   K 4.5 07/09/2013   CL 99 07/09/2013   CO2 27 07/09/2013   BUN 20 07/09/2013   CREATININE 1.31* 07/09/2013   GLUCOSE 111* 07/09/2013   Lab Results  Component Value Date   INR 1.01 07/09/2013    Assessment/Plan: Patient admitted for laminectomy L3-4 and L4-5 the left for stenosis. Patient has failed a reasonable attempt at conservative therapy.  I explained the condition and procedure to the patient and answered any questions.  Patient wishes to proceed with procedure as planned. Understands risks/ benefits and typical outcomes of procedure.   Joshue Badal S 07/19/2013 8:34 AM

## 2013-07-19 NOTE — Anesthesia Preprocedure Evaluation (Addendum)
Anesthesia Evaluation  Patient identified by MRN, date of birth, ID band Patient awake    Reviewed: Allergy & Precautions, H&P , NPO status , Patient's Chart, lab work & pertinent test results  History of Anesthesia Complications (+) PONV  Airway Mallampati: I TM Distance: >3 FB Neck ROM: Full    Dental  (+) Teeth Intact and Caps,    Pulmonary neg pulmonary ROS,  breath sounds clear to auscultation  Pulmonary exam normal       Cardiovascular Exercise Tolerance: Good hypertension, Pt. on medications and Pt. on home beta blockers Rhythm:Regular Rate:Normal  09-Jul-2013 Sinus rhythm with Premature supraventricular complexes Left axis deviation Abnormal ECG   Neuro/Psych  Headaches, Depression    GI/Hepatic Neg liver ROS, GERD-  Medicated and Controlled,  Endo/Other  negative endocrine ROS  Renal/GU negative Renal ROS  negative genitourinary   Musculoskeletal  (+) Arthritis -, Osteoarthritis,    Abdominal   Peds  Hematology negative hematology ROS (+) anemia ,   Anesthesia Other Findings Crowns entire upper mouth- Dental advisory given.  Reproductive/Obstetrics negative OB ROS                      Anesthesia Physical Anesthesia Plan  ASA: II  Anesthesia Plan: General   Post-op Pain Management:    Induction: Intravenous  Airway Management Planned: Oral ETT  Additional Equipment:   Intra-op Plan:   Post-operative Plan: Extubation in OR  Informed Consent: I have reviewed the patients History and Physical, chart, labs and discussed the procedure including the risks, benefits and alternatives for the proposed anesthesia with the patient or authorized representative who has indicated his/her understanding and acceptance.   Dental advisory given  Plan Discussed with: CRNA and Surgeon  Anesthesia Plan Comments:         Anesthesia Quick Evaluation

## 2013-07-20 NOTE — Discharge Summary (Signed)
Physician Discharge Summary  Patient ID: Martha Hendrix MRN: [id number] DOB/AGE: 08-23-1926 80 y.o.  Admit date: 07/19/2013 Discharge date: 07/20/2013  Admission Diagnoses:Lumbar spinal stenosis L3-4, L4-5 Left with left leg pain   Discharge Diagnoses: Lumbar spinal stenosis L3-4, L4-5 Left with left leg pain  Active Problems:   S/P lumbar laminectomy   Discharged Condition: good  Hospital Course: Patient is a 80 y.o. female admitted for decompressive laminectomy for stenosis. Onset of symptoms was several months ago, gradually worsening since that time.  The pain is rated severe, and is located at the across the lower back and radiates to left leg. The pain is described as aching and occurs intermittently. The symptoms have been progressive. Symptoms are exacerbated by exercise. MRI or CT showed spinal stenosis L3-4 and L4-5 on the left  She was admitted to the hospital and taken to the operating room for an uncomplicated Left U1-3 and L4-5 hemilaminectomy, medial facetectomy, and foraminotomies Post op she has done very well. Her wound is clean, dry, and without signs of infection. She is voiding, walking, and tolerating a regular diet.    Consults: None  Significant Diagnostic Studies: none  Treatments: surgery: Left L3-4 and L4-5 hemilaminectomy, medial facetectomy, and foraminotomies   Discharge Exam: Blood pressure 96/59, pulse 66, temperature 98 F (36.7 C), temperature source Oral, resp. rate 18, SpO2 94.00%. General appearance: alert, cooperative, appears stated age and no distress Neurologic: Alert and oriented X 3, normal strength and tone. Normal symmetric reflexes. Normal coordination and gait  Disposition:      Medication List         acetaminophen 500 MG tablet  Commonly known as:  TYLENOL  Take 1,000 mg by mouth 2 (two) times daily as needed (pain).     CALTRATE 600 PLUS-VIT D PO  Take 1 tablet by mouth daily.     escitalopram 10 MG tablet  Commonly  known as:  LEXAPRO  Take 10 mg by mouth daily.     latanoprost 0.005 % ophthalmic solution  Commonly known as:  XALATAN  Place 1 drop into both eyes at bedtime.     multivitamin with minerals Tabs tablet  Take 1 tablet by mouth daily. Centrum     naproxen 500 MG tablet  Commonly known as:  NAPROSYN  Take 500 mg by mouth 2 (two) times daily as needed (pain).     nebivolol 10 MG tablet  Commonly known as:  BYSTOLIC  Take 10 mg by mouth daily.     pantoprazole 40 MG tablet  Commonly known as:  PROTONIX  Take 40 mg by mouth daily.     traMADol 50 MG tablet  Commonly known as:  ULTRAM  Take 25-50 mg by mouth every 6 (six) hours as needed (pain).     valsartan-hydrochlorothiazide 160-12.5 MG per tablet  Commonly known as:  DIOVAN-HCT  Take 1 tablet by mouth daily.     zolpidem 10 MG tablet  Commonly known as:  AMBIEN  Take 10 mg by mouth at bedtime as needed for sleep.           Follow-up Information   Follow up with Eustace Moore, MD.   Specialty:  Neurosurgery   Contact information:   [address] [place] [address] [phone]       Signed: Janalee Grobe L 07/20/2013, 8:26 AM

## 2013-07-20 NOTE — Progress Notes (Signed)
Pt. Alert and oriented,follows simple instructions, denies pain. Incision area without swelling, redness or S/S of infection. Voiding adequate clear yellow urine. Moving all extremities well and vitals stable and documented. Patient discharged home with family.  Lumbar surgery notes instructions given to patient and family member for home safety and precautions. Pt. and family stated understanding of instructions given 

## 2013-07-20 NOTE — Discharge Instructions (Signed)
Wound Care Keep incision covered and dry for 2 days.  You may remove outer bandage. Do not put any creams, lotions, or ointments on incision. Leave steri-strips on back.  They will fall off by themselves. Activity Walk each and every day, increasing distance each day. No lifting greater than 5 lbs.  Avoid bending, arching, or twisting. No driving for 2 weeks; may ride as a passenger locally.  Diet Resume your normal diet.  Return to Work Will be discussed at you follow up appointment. Call Your Doctor If Any of These Occur Redness, drainage, or swelling at the wound.  Temperature greater than 101 degrees. Severe pain not relieved by pain medication. Incision starts to come apart. Follow Up Appt Call today for appointment in 1-2 weeks ([phone]) or for problems.  If you have any hardware placed in your spine, you will need an x-ray before your appointment.

## 2013-07-22 ENCOUNTER — Encounter (HOSPITAL_COMMUNITY): Payer: Self-pay | Admitting: Neurological Surgery

## 2013-09-02 DIAGNOSIS — M48061 Spinal stenosis, lumbar region without neurogenic claudication: Secondary | ICD-10-CM | POA: Diagnosis not present

## 2013-09-03 DIAGNOSIS — M47817 Spondylosis without myelopathy or radiculopathy, lumbosacral region: Secondary | ICD-10-CM | POA: Diagnosis not present

## 2013-09-03 DIAGNOSIS — M5137 Other intervertebral disc degeneration, lumbosacral region: Secondary | ICD-10-CM | POA: Diagnosis not present

## 2013-09-03 DIAGNOSIS — M48061 Spinal stenosis, lumbar region without neurogenic claudication: Secondary | ICD-10-CM | POA: Diagnosis not present

## 2013-09-05 DIAGNOSIS — Z961 Presence of intraocular lens: Secondary | ICD-10-CM | POA: Diagnosis not present

## 2013-09-05 DIAGNOSIS — H52209 Unspecified astigmatism, unspecified eye: Secondary | ICD-10-CM | POA: Diagnosis not present

## 2013-09-05 DIAGNOSIS — H524 Presbyopia: Secondary | ICD-10-CM | POA: Diagnosis not present

## 2013-09-05 DIAGNOSIS — H259 Unspecified age-related cataract: Secondary | ICD-10-CM | POA: Diagnosis not present

## 2013-09-25 ENCOUNTER — Other Ambulatory Visit: Payer: Self-pay | Admitting: Dermatology

## 2013-09-25 DIAGNOSIS — L821 Other seborrheic keratosis: Secondary | ICD-10-CM | POA: Diagnosis not present

## 2013-09-25 DIAGNOSIS — L820 Inflamed seborrheic keratosis: Secondary | ICD-10-CM | POA: Diagnosis not present

## 2013-09-25 DIAGNOSIS — L851 Acquired keratosis [keratoderma] palmaris et plantaris: Secondary | ICD-10-CM | POA: Diagnosis not present

## 2013-09-25 DIAGNOSIS — L819 Disorder of pigmentation, unspecified: Secondary | ICD-10-CM | POA: Diagnosis not present

## 2013-09-25 DIAGNOSIS — D485 Neoplasm of uncertain behavior of skin: Secondary | ICD-10-CM | POA: Diagnosis not present

## 2013-09-25 DIAGNOSIS — Z85828 Personal history of other malignant neoplasm of skin: Secondary | ICD-10-CM | POA: Diagnosis not present

## 2013-10-01 DIAGNOSIS — M25559 Pain in unspecified hip: Secondary | ICD-10-CM | POA: Diagnosis not present

## 2013-10-01 DIAGNOSIS — I10 Essential (primary) hypertension: Secondary | ICD-10-CM | POA: Diagnosis not present

## 2013-10-04 DIAGNOSIS — M1610 Unilateral primary osteoarthritis, unspecified hip: Secondary | ICD-10-CM | POA: Diagnosis not present

## 2013-10-04 DIAGNOSIS — M169 Osteoarthritis of hip, unspecified: Secondary | ICD-10-CM | POA: Diagnosis not present

## 2013-10-04 DIAGNOSIS — M25559 Pain in unspecified hip: Secondary | ICD-10-CM | POA: Diagnosis not present

## 2013-10-12 DIAGNOSIS — M25559 Pain in unspecified hip: Secondary | ICD-10-CM | POA: Diagnosis not present

## 2013-10-12 DIAGNOSIS — M169 Osteoarthritis of hip, unspecified: Secondary | ICD-10-CM | POA: Diagnosis not present

## 2013-10-12 DIAGNOSIS — M1610 Unilateral primary osteoarthritis, unspecified hip: Secondary | ICD-10-CM | POA: Diagnosis not present

## 2013-10-14 DIAGNOSIS — M1990 Unspecified osteoarthritis, unspecified site: Secondary | ICD-10-CM | POA: Diagnosis not present

## 2013-10-14 DIAGNOSIS — I872 Venous insufficiency (chronic) (peripheral): Secondary | ICD-10-CM | POA: Diagnosis not present

## 2013-10-14 DIAGNOSIS — IMO0002 Reserved for concepts with insufficient information to code with codable children: Secondary | ICD-10-CM | POA: Diagnosis not present

## 2013-10-14 DIAGNOSIS — I10 Essential (primary) hypertension: Secondary | ICD-10-CM | POA: Diagnosis not present

## 2013-10-14 DIAGNOSIS — F329 Major depressive disorder, single episode, unspecified: Secondary | ICD-10-CM | POA: Diagnosis not present

## 2013-10-14 DIAGNOSIS — Q762 Congenital spondylolisthesis: Secondary | ICD-10-CM | POA: Diagnosis not present

## 2013-10-15 ENCOUNTER — Encounter (HOSPITAL_COMMUNITY): Payer: Self-pay | Admitting: Pharmacy Technician

## 2013-10-15 NOTE — Progress Notes (Signed)
Matt Babish,  Please enter preop orders in Epic for Martha Hendrix - add on for surgery 5/12 and she is coming to [facility] Thursday 5/7 for her preop.  Thanks!

## 2013-10-16 NOTE — Patient Instructions (Signed)
Martha Hendrix  10/16/2013   Your procedure is scheduled on: 10/22/2013  1200noon-130pm   Report to [facility] at    0900  AM.  Call this number if you have problems the morning of surgery: [phone]   Remember:   Do not eat food or drink liquids after midnight.   Take these medicines the morning of surgery with A SIP OF WATER:    Do not wear jewelry, make-up or nail polish.  Do not wear lotions, powders, or perfumes.   Do not shave 48 hours prior to surgery.   Do not bring valuables to the hospital.  Contacts, dentures or bridgework may not be worn into surgery.  Leave suitcase in the car. After surgery it may be brought to your room.  For patients admitted to the hospital, checkout time is 11:00 AM the day of  discharge.     [place] - Preparing for Surgery Before surgery, you can play an important role.  Because skin is not sterile, your skin needs to be as free of germs as possible.  You can reduce the number of germs on your skin by washing with CHG (chlorahexidine gluconate) soap before surgery.  CHG is an antiseptic cleaner which kills germs and bonds with the skin to continue killing germs even after washing. Please DO NOT use if you have an allergy to CHG or antibacterial soaps.  If your skin becomes reddened/irritated stop using the CHG and inform your nurse when you arrive at Short Stay. Do not shave (including legs and underarms) for at least 48 hours prior to the first CHG shower.  You may shave your face. Please follow these instructions carefully:  1.  Shower with CHG Soap the night before surgery and the  morning of Surgery.  2.  If you choose to wash your hair, wash your hair first as usual with your  normal  shampoo.  3.  After you shampoo, rinse your hair and body thoroughly to remove the  shampoo.                           4.  Use CHG as you would any other liquid soap.  You can apply chg directly  to the skin and wash   Gently with a scrungie or clean washcloth.  5.  Apply the CHG Soap to your body ONLY FROM THE NECK DOWN.   Do not use on open                           Wound or open sores. Avoid contact with eyes, ears mouth and genitals (private parts).                        Genitals (private parts) with your normal soap.             6.  Wash thoroughly, paying special attention to the area where your surgery  will be performed.  7.  Thoroughly rinse your body with warm water from the neck down.  8.  DO NOT shower/wash with your normal soap after using and rinsing off  the CHG Soap.                9.  Pat yourself dry with a clean towel.            10.  Wear clean pajamas.  11.  Place clean sheets on your bed the night of your first shower and do not  sleep with pets. Day of Surgery : Do not apply any lotions/deodorants the morning of surgery.  Please wear clean clothes to the hospital/surgery center.  FAILURE TO FOLLOW THESE INSTRUCTIONS MAY RESULT IN THE CANCELLATION OF YOUR SURGERY PATIENT SIGNATURE_________________________________  NURSE SIGNATURE__________________________________  ________________________________________________________________________   Adam Phenix  An incentive spirometer is a tool that can help keep your lungs clear and active. This tool measures how well you are filling your lungs with each breath. Taking long deep breaths may help reverse or decrease the chance of developing breathing (pulmonary) problems (especially infection) following:  A long period of time when you are unable to move or be active. BEFORE THE PROCEDURE   If the spirometer includes an indicator to show your best effort, your nurse or respiratory therapist will set it to a desired goal.  If possible, sit up straight or lean slightly forward. Try not to slouch.  Hold the incentive spirometer in an upright position. INSTRUCTIONS FOR USE  1. Sit on the edge of your bed if possible, or  sit up as far as you can in bed or on a chair. 2. Hold the incentive spirometer in an upright position. 3. Breathe out normally. 4. Place the mouthpiece in your mouth and seal your lips tightly around it. 5. Breathe in slowly and as deeply as possible, raising the piston or the ball toward the top of the column. 6. Hold your breath for 3-5 seconds or for as long as possible. Allow the piston or ball to fall to the bottom of the column. 7. Remove the mouthpiece from your mouth and breathe out normally. 8. Rest for a few seconds and repeat Steps 1 through 7 at least 10 times every 1-2 hours when you are awake. Take your time and take a few normal breaths between deep breaths. 9. The spirometer may include an indicator to show your best effort. Use the indicator as a goal to work toward during each repetition. 10. After each set of 10 deep breaths, practice coughing to be sure your lungs are clear. If you have an incision (the cut made at the time of surgery), support your incision when coughing by placing a pillow or rolled up towels firmly against it. Once you are able to get out of bed, walk around indoors and cough well. You may stop using the incentive spirometer when instructed by your caregiver.  RISKS AND COMPLICATIONS  Take your time so you do not get dizzy or light-headed.  If you are in pain, you may need to take or ask for pain medication before doing incentive spirometry. It is harder to take a deep breath if you are having pain. AFTER USE  Rest and breathe slowly and easily.  It can be helpful to keep track of a log of your progress. Your caregiver can provide you with a simple table to help with this. If you are using the spirometer at home, follow these instructions: [place] IF:   You are having difficultly using the spirometer.  You have trouble using the spirometer as often as instructed.  Your pain medication is not giving enough relief while using the  spirometer.  You develop fever of 100.5 F (38.1 C) or higher. SEEK IMMEDIATE MEDICAL CARE IF:   You cough up bloody sputum that had not been present before.  You develop fever of 102 F (38.9 C)  or greater.  You develop worsening pain at or near the incision site. MAKE SURE YOU:   Understand these instructions.  Will watch your condition.  Will get help right away if you are not doing well or get worse. Document Released: 10/10/2006 Document Revised: 08/22/2011 Document Reviewed: 12/11/2006 ExitCare Patient Information 2014 ExitCare, [place].   ________________________________________________________________________  WHAT IS A BLOOD TRANSFUSION? Blood Transfusion Information  A transfusion is the replacement of blood or some of its parts. Blood is made up of multiple cells which provide different functions.  Red blood cells carry oxygen and are used for blood loss replacement.  White blood cells fight against infection.  Platelets control bleeding.  Plasma helps clot blood.  Other blood products are available for specialized needs, such as hemophilia or other clotting disorders. BEFORE THE TRANSFUSION  Who gives blood for transfusions?   Healthy volunteers who are fully evaluated to make sure their blood is safe. This is blood bank blood. Transfusion therapy is the safest it has ever been in the practice of medicine. Before blood is taken from a donor, a complete history is taken to make sure that person has no history of diseases nor engages in risky social behavior (examples are intravenous drug use or sexual activity with multiple partners). The donor's travel history is screened to minimize risk of transmitting infections, such as malaria. The donated blood is tested for signs of infectious diseases, such as HIV and hepatitis. The blood is then tested to be sure it is compatible with you in order to minimize the chance of a transfusion reaction. If you or a relative  donates blood, this is often done in anticipation of surgery and is not appropriate for emergency situations. It takes many days to process the donated blood. RISKS AND COMPLICATIONS Although transfusion therapy is very safe and saves many lives, the main dangers of transfusion include:   Getting an infectious disease.  Developing a transfusion reaction. This is an allergic reaction to something in the blood you were given. Every precaution is taken to prevent this. The decision to have a blood transfusion has been considered carefully by your caregiver before blood is given. Blood is not given unless the benefits outweigh the risks. AFTER THE TRANSFUSION  Right after receiving a blood transfusion, you will usually feel much better and more energetic. This is especially true if your red blood cells have gotten low (anemic). The transfusion raises the level of the red blood cells which carry oxygen, and this usually causes an energy increase.  The nurse administering the transfusion will monitor you carefully for complications. HOME CARE INSTRUCTIONS  No special instructions are needed after a transfusion. You may find your energy is better. Speak with your caregiver about any limitations on activity for underlying diseases you may have. SEEK MEDICAL CARE IF:   Your condition is not improving after your transfusion.  You develop redness or irritation at the intravenous (IV) site. SEEK IMMEDIATE MEDICAL CARE IF:  Any of the following symptoms occur over the next 12 hours:  Shaking chills.  You have a temperature by mouth above 102 F (38.9 C), not controlled by medicine.  Chest, back, or muscle pain.  People around you feel you are not acting correctly or are confused.  Shortness of breath or difficulty breathing.  Dizziness and fainting.  You get a rash or develop hives.  You have a decrease in urine output.  Your urine turns a dark color or changes to pink, red, or  brown. Any  of the following symptoms occur over the next 10 days:  You have a temperature by mouth above 102 F (38.9 C), not controlled by medicine.  Shortness of breath.  Weakness after normal activity.  The white part of the eye turns yellow (jaundice).  You have a decrease in the amount of urine or are urinating less often.  Your urine turns a dark color or changes to pink, red, or brown. Document Released: 05/27/2000 Document Revised: 08/22/2011 Document Reviewed: 01/14/2008 ExitCare Patient Information 2014 ExitCare, [place].  _______________________________________________________________________   Please read over the following fact sheets that you were given: MRSA Information, coughing and deep breathing exercises, leg exercises

## 2013-10-17 ENCOUNTER — Encounter (HOSPITAL_COMMUNITY): Payer: Self-pay

## 2013-10-17 ENCOUNTER — Encounter (HOSPITAL_COMMUNITY)
Admission: RE | Admit: 2013-10-17 | Discharge: 2013-10-17 | Disposition: A | Discharge status: Home / Self Care | Payer: Medicare Other | Source: Ambulatory Visit | Attending: Orthopedic Surgery | Admitting: Orthopedic Surgery

## 2013-10-17 DIAGNOSIS — Z01812 Encounter for preprocedural laboratory examination: Secondary | ICD-10-CM | POA: Insufficient documentation

## 2013-10-17 LAB — BASIC METABOLIC PANEL
BUN: 30 mg/dL — AB (ref 6–23)
CHLORIDE: 96 meq/L (ref 96–112)
CO2: 30 mEq/L (ref 19–32)
CREATININE: 1.33 mg/dL — AB (ref 0.50–1.10)
Calcium: 9.6 mg/dL (ref 8.4–10.5)
GFR calc Af Amer: 41 mL/min — ABNORMAL LOW (ref >90)
GFR calc non Af Amer: 35 mL/min — ABNORMAL LOW (ref >90)
Glucose, Bld: 150 mg/dL — ABNORMAL HIGH (ref 70–99)
Potassium: 4.3 mEq/L (ref 3.7–5.3)
Sodium: 137 mEq/L (ref 137–147)

## 2013-10-17 LAB — URINALYSIS, ROUTINE W REFLEX MICROSCOPIC
Bilirubin Urine: NEGATIVE
GLUCOSE, UA: NEGATIVE mg/dL
Hgb urine dipstick: NEGATIVE
KETONES UR: NEGATIVE mg/dL
NITRITE: NEGATIVE
PH: 5 (ref 5.0–8.0)
Protein, ur: NEGATIVE mg/dL
SPECIFIC GRAVITY, URINE: 1.019 (ref 1.005–1.030)
Urobilinogen, UA: 0.2 mg/dL (ref 0.0–1.0)

## 2013-10-17 LAB — CBC
HEMATOCRIT: 36.4 % (ref 36.0–46.0)
HEMOGLOBIN: 11.8 g/dL — AB (ref 12.0–15.0)
MCH: 29.5 pg (ref 26.0–34.0)
MCHC: 32.4 g/dL (ref 30.0–36.0)
MCV: 91 fL (ref 78.0–100.0)
Platelets: 309 10*3/uL (ref 150–400)
RBC: 4 MIL/uL (ref 3.87–5.11)
RDW: 13 % (ref 11.5–15.5)
WBC: 8.1 10*3/uL (ref 4.0–10.5)

## 2013-10-17 LAB — URINE MICROSCOPIC-ADD ON

## 2013-10-17 LAB — SURGICAL PCR SCREEN
MRSA, PCR: NEGATIVE
STAPHYLOCOCCUS AUREUS: POSITIVE — AB

## 2013-10-17 LAB — PROTIME-INR
INR: 1.11 (ref 0.00–1.49)
Prothrombin Time: 14.1 seconds (ref 11.6–15.2)

## 2013-10-17 LAB — APTT: APTT: 30 s (ref 24–37)

## 2013-10-17 NOTE — Progress Notes (Signed)
Need orders in EPIC.  Surgery scheduled for 10/22/13.  No orders in EPIC.  Preop apponintment on 10/17/13 at 230pm.  Thank You.

## 2013-10-17 NOTE — Progress Notes (Signed)
CXR- 12/10/2012 on chart  EKG- 07/21/13 EPIC  Clearance 10/14/2013 on chart - Dr Reynaldo Minium

## 2013-10-17 NOTE — Progress Notes (Signed)
BMP , Urinalysis and micro results faxed via EPIC to Dr Alvan Dame.

## 2013-10-18 NOTE — Progress Notes (Signed)
Need orders in EPIC.  Surgery scheduled for 10/22/13.  Thank You.

## 2013-10-18 NOTE — Progress Notes (Signed)
Script for Mupirocin to [facility]- [phone] asked them to deliver per patient's request . Spoke with Will Bonnet, pharmacist.

## 2013-10-20 NOTE — H&P (Signed)
TOTAL HIP ADMISSION H&P  Patient is admitted for left total hip arthroplasty, anterior approach.  Subjective:  Chief Complaint:    Left hip OA / pain  HPI: Martha Hendrix, 80 y.o. female, has a history of pain and functional disability in the left hip(s) due to arthritis.  Review of her history indicates that this began to bother her about a year ago. She had radiographic evaluation and at that time it was felt that perhaps most of her symptoms were coming from her back. She was subsequently was evaluated by neurosurgeons and ultimately had a surgical evaluation and procedure performed for decompression. Her symptoms persisted and upon repeat evaluation with MRI this revealed significant degenerative changes in her joints.  Advanced progressive left hip degenerative changes. In the office today, I reviewed with Martha Hendrix new X-rays, AP of the pelvis and lateral of the left hip, showing a complete loss of her joint space with significant erosion and defect of the femoral head. This was compared to films from last year in our office. She had a previous MRI that confirmed the degenerative changes but also significant acetabular and femoral neck head stress changes.  At this point, given the fact she is in as much discomfort as she is and given the findings here today, arthroplasty is the only option in an effort to provide relief to her. We discussed hip replacement surgery through an anterior approach as compared to what she had been through before, the desired expectations and the risks.  Risks, benefits and expectations were discussed with the patient.  Risks including but not limited to the risk of anesthesia, blood clots, nerve damage, blood vessel damage, failure of the prosthesis, infection and up to and including death.  Patient understand the risks, benefits and expectations and wishes to proceed with surgery.    D/C Plans:   Home with HHPT/SNF  Post-op Meds:    No Rx  given  Tranexamic Acid:   To be given  Decadron:    To be given  FYI:    ASA post-op  Tramadol post-op   No dilaudid post-op  Patient Active Problem List   Diagnosis Date Noted  . S/P lumbar laminectomy 07/19/2013  . ESOPHAGEAL STRICTURE 10/27/2008  . GERD 10/27/2008  . DYSPHAGIA 10/27/2008  . PERSONAL HX COLONIC POLYPS 10/27/2008   Past Medical History  Diagnosis Date  . Hypertension     takes Diovan and Bystolic daily  . GERD (gastroesophageal reflux disease)     takes Protonix daily  . PONV (postoperative nausea and vomiting)   . History of bronchitis     last time unknown  . Headache(784.0)     occasionally  . Joint pain   . Joint swelling   . Chronic back pain     stenosis  . Arthritis     osteo  . Bruises easily     on both legs  . History of colon polyps   . Nocturia   . Anemia     as a child and never had a transfusion  . Cataract   . Glaucoma     uses eye drops  . Depression     takes Lexapro daily  . Insomnia     takes Ambien nightly    Past Surgical History  Procedure Laterality Date  . Left arm surgery      d/t break as child  . Appendectomy      as a child  . Joint replacement Right 2001  right  . Total hip arthroplasty Right   . Cataract surgery Left 2014  . Colonoscopy    . Esophagogastroduodenoscopy      with ED  . Epidural block injection    . Lumbar laminectomy/decompression microdiscectomy Left 07/19/2013    Procedure: LUMBAR LAMINECTOMY/DECOMPRESSION MICRODISCECTOMY LUMBAR THREE-FOUR,LUMBAR FOUR-FIVE;  Surgeon: Eustace Moore, MD;  Location: [facility];  Service: Neurosurgery;  Laterality: Left;    No prescriptions prior to admission   Allergies  Allergen Reactions  . Codeine Nausea And Vomiting and Other (See Comments)    "spacy feeling"  . Hydrocodone Nausea And Vomiting    spacey feeling     History  Substance Use Topics  . Smoking status: Never Smoker   . Smokeless tobacco: Never Used  . Alcohol Use: Yes      Comment: glass of wine every 3-25months    No family history on file.   Review of Systems  Constitutional: Negative.   Eyes: Negative.   Respiratory: Negative.   Cardiovascular: Negative.   Gastrointestinal: Positive for heartburn.  Genitourinary: Negative.   Musculoskeletal: Positive for back pain and joint pain.  Skin: Negative.   Neurological: Positive for headaches.  Endo/Heme/Allergies: Negative.   Psychiatric/Behavioral: Positive for depression. The patient has insomnia.     Objective:  Physical Exam  Constitutional: She is oriented to person, place, and time. She appears well-developed and well-nourished.  HENT:  Head: Normocephalic and atraumatic.  Mouth/Throat: Oropharynx is clear and moist.  Eyes: Pupils are equal, round, and reactive to light.  Neck: Neck supple. No JVD present. No tracheal deviation present. No thyromegaly present.  Cardiovascular: Normal rate, regular rhythm, normal heart sounds and intact distal pulses.   Respiratory: Effort normal and breath sounds normal. No stridor. No respiratory distress. She has no wheezes.  GI: Soft. There is no tenderness. There is no guarding.  Musculoskeletal:       Left hip: She exhibits decreased range of motion, decreased strength, tenderness and bony tenderness. She exhibits no swelling, no deformity and no laceration.  Lymphadenopathy:    She has no cervical adenopathy.  Neurological: She is alert and oriented to person, place, and time.  Skin: Skin is warm and dry.  Psychiatric: She has a normal mood and affect.    Labs:  Estimated body mass index is 26.15 kg/(m^2) as calculated from the following:   Height as of 07/09/13: 5\' 3"  (1.6 m).   Weight as of 07/09/13: 66.951 kg (147 lb 9.6 oz).   Imaging Review Plain radiographs demonstrate severe degenerative joint disease of the left hip(s). The bone quality appears to be good for age and reported activity level.  Assessment/Plan:  End stage arthritis, left  hip(s)  The patient history, physical examination, clinical judgement of the provider and imaging studies are consistent with end stage degenerative joint disease of the left hip(s) and total hip arthroplasty is deemed medically necessary. The treatment options including medical management, injection therapy, arthroscopy and arthroplasty were discussed at length. The risks and benefits of total hip arthroplasty were presented and reviewed. The risks due to aseptic loosening, infection, stiffness, dislocation/subluxation,  thromboembolic complications and other imponderables were discussed.  The patient acknowledged the explanation, agreed to proceed with the plan and consent was signed. Patient is being admitted for inpatient treatment for surgery, pain control, PT, OT, prophylactic antibiotics, VTE prophylaxis, progressive ambulation and ADL's and discharge planning.The patient is planning to be discharged to skilled nursing facility / home.     Rodman Key  S. Raymel Cull   PAC  10/20/2013, 10:04 PM

## 2013-10-22 ENCOUNTER — Inpatient Hospital Stay (HOSPITAL_COMMUNITY): Payer: Medicare Other | Admitting: Anesthesiology

## 2013-10-22 ENCOUNTER — Encounter (HOSPITAL_COMMUNITY): Payer: Medicare Other | Admitting: Anesthesiology

## 2013-10-22 ENCOUNTER — Inpatient Hospital Stay (HOSPITAL_COMMUNITY)
Admission: RE | Admit: 2013-10-22 | Discharge: 2013-10-25 | DRG: 470 | Disposition: A | Discharge status: Skilled Nursing Facility | Payer: Medicare Other | Source: Ambulatory Visit | Attending: Orthopedic Surgery | Admitting: Orthopedic Surgery

## 2013-10-22 ENCOUNTER — Encounter (HOSPITAL_COMMUNITY): Payer: Self-pay | Admitting: *Deleted

## 2013-10-22 ENCOUNTER — Inpatient Hospital Stay (HOSPITAL_COMMUNITY): Payer: Medicare Other

## 2013-10-22 ENCOUNTER — Encounter (HOSPITAL_COMMUNITY)
Admission: RE | Disposition: A | Discharge status: Skilled Nursing Facility | Payer: Self-pay | Source: Ambulatory Visit | Attending: Orthopedic Surgery

## 2013-10-22 DIAGNOSIS — Z6825 Body mass index (BMI) 25.0-25.9, adult: Secondary | ICD-10-CM

## 2013-10-22 DIAGNOSIS — K219 Gastro-esophageal reflux disease without esophagitis: Secondary | ICD-10-CM | POA: Diagnosis present

## 2013-10-22 DIAGNOSIS — F3289 Other specified depressive episodes: Secondary | ICD-10-CM | POA: Diagnosis present

## 2013-10-22 DIAGNOSIS — M169 Osteoarthritis of hip, unspecified: Principal | ICD-10-CM | POA: Diagnosis present

## 2013-10-22 DIAGNOSIS — Z96649 Presence of unspecified artificial hip joint: Secondary | ICD-10-CM | POA: Diagnosis not present

## 2013-10-22 DIAGNOSIS — N390 Urinary tract infection, site not specified: Secondary | ICD-10-CM | POA: Diagnosis not present

## 2013-10-22 DIAGNOSIS — G4700 Insomnia, unspecified: Secondary | ICD-10-CM | POA: Diagnosis present

## 2013-10-22 DIAGNOSIS — E663 Overweight: Secondary | ICD-10-CM | POA: Diagnosis present

## 2013-10-22 DIAGNOSIS — F329 Major depressive disorder, single episode, unspecified: Secondary | ICD-10-CM | POA: Diagnosis present

## 2013-10-22 DIAGNOSIS — D62 Acute posthemorrhagic anemia: Secondary | ICD-10-CM | POA: Diagnosis not present

## 2013-10-22 DIAGNOSIS — D500 Iron deficiency anemia secondary to blood loss (chronic): Secondary | ICD-10-CM | POA: Diagnosis not present

## 2013-10-22 DIAGNOSIS — K5900 Constipation, unspecified: Secondary | ICD-10-CM | POA: Diagnosis not present

## 2013-10-22 DIAGNOSIS — H409 Unspecified glaucoma: Secondary | ICD-10-CM | POA: Diagnosis present

## 2013-10-22 DIAGNOSIS — Z860100 Personal history of colon polyps, unspecified: Secondary | ICD-10-CM

## 2013-10-22 DIAGNOSIS — I10 Essential (primary) hypertension: Secondary | ICD-10-CM | POA: Diagnosis present

## 2013-10-22 DIAGNOSIS — M549 Dorsalgia, unspecified: Secondary | ICD-10-CM | POA: Diagnosis not present

## 2013-10-22 DIAGNOSIS — M48061 Spinal stenosis, lumbar region without neurogenic claudication: Secondary | ICD-10-CM | POA: Diagnosis present

## 2013-10-22 DIAGNOSIS — Z471 Aftercare following joint replacement surgery: Secondary | ICD-10-CM | POA: Diagnosis not present

## 2013-10-22 DIAGNOSIS — M1610 Unilateral primary osteoarthritis, unspecified hip: Principal | ICD-10-CM | POA: Diagnosis present

## 2013-10-22 HISTORY — PX: TOTAL HIP ARTHROPLASTY: SHX124

## 2013-10-22 LAB — ABO/RH: ABO/RH(D): O POS

## 2013-10-22 LAB — TYPE AND SCREEN
ABO/RH(D): O POS
ANTIBODY SCREEN: NEGATIVE

## 2013-10-22 SURGERY — ARTHROPLASTY, HIP, TOTAL, ANTERIOR APPROACH
Anesthesia: General | Site: Hip | Laterality: Left

## 2013-10-22 MED ORDER — DEXAMETHASONE SODIUM PHOSPHATE 10 MG/ML IJ SOLN
INTRAMUSCULAR | Status: AC
  Filled 2013-10-22: qty 1

## 2013-10-22 MED ORDER — NEOSTIGMINE METHYLSULFATE 10 MG/10ML IV SOLN
INTRAVENOUS | Status: AC
  Filled 2013-10-22: qty 1

## 2013-10-22 MED ORDER — 0.9 % SODIUM CHLORIDE (POUR BTL) OPTIME
TOPICAL | Status: DC | PRN
  Administered 2013-10-22: 1000 mL

## 2013-10-22 MED ORDER — CEFAZOLIN SODIUM-DEXTROSE 2-3 GM-% IV SOLR
2.0000 g | Freq: Four times a day (QID) | INTRAVENOUS | Status: AC
  Administered 2013-10-22 (×2): 2 g via INTRAVENOUS
  Filled 2013-10-22 (×2): qty 50

## 2013-10-22 MED ORDER — DIPHENHYDRAMINE HCL 25 MG PO CAPS: 25.0000 mg | ORAL_CAPSULE | Freq: Four times a day (QID) | ORAL | Status: DC | PRN

## 2013-10-22 MED ORDER — GLYCOPYRROLATE 0.2 MG/ML IJ SOLN
INTRAMUSCULAR | Status: AC
  Filled 2013-10-22: qty 3

## 2013-10-22 MED ORDER — ROCURONIUM BROMIDE 100 MG/10ML IV SOLN
INTRAVENOUS | Status: DC | PRN
  Administered 2013-10-22: 30 mg via INTRAVENOUS

## 2013-10-22 MED ORDER — DOCUSATE SODIUM 100 MG PO CAPS
100.0000 mg | ORAL_CAPSULE | Freq: Two times a day (BID) | ORAL | Status: DC
  Administered 2013-10-22 – 2013-10-25 (×6): 100 mg via ORAL

## 2013-10-22 MED ORDER — PROPOFOL 10 MG/ML IV BOLUS
INTRAVENOUS | Status: AC
  Filled 2013-10-22: qty 20

## 2013-10-22 MED ORDER — TRAMADOL HCL 50 MG PO TABS
50.0000 mg | ORAL_TABLET | Freq: Four times a day (QID) | ORAL | Status: DC
  Administered 2013-10-22: 100 mg via ORAL
  Administered 2013-10-22: 50 mg via ORAL
  Administered 2013-10-23 – 2013-10-24 (×4): 100 mg via ORAL
  Administered 2013-10-24: 50 mg via ORAL
  Administered 2013-10-24 – 2013-10-25 (×2): 100 mg via ORAL
  Filled 2013-10-22 (×4): qty 2
  Filled 2013-10-22 (×2): qty 1
  Filled 2013-10-22 (×5): qty 2

## 2013-10-22 MED ORDER — PHENOL 1.4 % MT LIQD
1.0000 | OROMUCOSAL | Status: DC | PRN
  Filled 2013-10-22: qty 177

## 2013-10-22 MED ORDER — HYDROMORPHONE HCL PF 1 MG/ML IJ SOLN
0.2500 mg | INTRAMUSCULAR | Status: DC | PRN
  Administered 2013-10-22 (×6): 0.25 mg via INTRAVENOUS

## 2013-10-22 MED ORDER — DEXAMETHASONE SODIUM PHOSPHATE 10 MG/ML IJ SOLN
INTRAMUSCULAR | Status: DC | PRN
  Administered 2013-10-22: 10 mg via INTRAVENOUS

## 2013-10-22 MED ORDER — NEBIVOLOL HCL 10 MG PO TABS
10.0000 mg | ORAL_TABLET | Freq: Every morning | ORAL | Status: DC
  Administered 2013-10-23 – 2013-10-25 (×3): 10 mg via ORAL
  Filled 2013-10-22 (×3): qty 1

## 2013-10-22 MED ORDER — PROMETHAZINE HCL 25 MG/ML IJ SOLN
INTRAMUSCULAR | Status: AC
  Administered 2013-10-22: 6.25 mg
  Filled 2013-10-22: qty 1

## 2013-10-22 MED ORDER — PROPOFOL 10 MG/ML IV BOLUS
INTRAVENOUS | Status: DC | PRN
  Administered 2013-10-22: 100 mg via INTRAVENOUS

## 2013-10-22 MED ORDER — BISACODYL 10 MG RE SUPP: 10.0000 mg | Freq: Every day | RECTAL | Status: DC | PRN

## 2013-10-22 MED ORDER — GLYCOPYRROLATE 0.2 MG/ML IJ SOLN
INTRAMUSCULAR | Status: DC | PRN
  Administered 2013-10-22: .6 mg via INTRAVENOUS

## 2013-10-22 MED ORDER — MAGNESIUM CITRATE PO SOLN: 1.0000 | Freq: Once | ORAL | Status: AC | PRN

## 2013-10-22 MED ORDER — VALSARTAN-HYDROCHLOROTHIAZIDE 160-12.5 MG PO TABS: 1.0000 | ORAL_TABLET | Freq: Every morning | ORAL | Status: DC

## 2013-10-22 MED ORDER — CELECOXIB 200 MG PO CAPS
200.0000 mg | ORAL_CAPSULE | Freq: Two times a day (BID) | ORAL | Status: DC
  Administered 2013-10-22 – 2013-10-23 (×2): 200 mg via ORAL
  Filled 2013-10-22 (×7): qty 1

## 2013-10-22 MED ORDER — CEFAZOLIN SODIUM-DEXTROSE 2-3 GM-% IV SOLR
INTRAVENOUS | Status: AC
  Filled 2013-10-22: qty 50

## 2013-10-22 MED ORDER — LIDOCAINE HCL (CARDIAC) 20 MG/ML IV SOLN
INTRAVENOUS | Status: AC
  Filled 2013-10-22: qty 5

## 2013-10-22 MED ORDER — SODIUM CHLORIDE 0.9 % IJ SOLN
INTRAMUSCULAR | Status: AC
  Filled 2013-10-22: qty 10

## 2013-10-22 MED ORDER — LACTATED RINGERS IV SOLN
INTRAVENOUS | Status: DC
  Administered 2013-10-22: 1000 mL via INTRAVENOUS

## 2013-10-22 MED ORDER — ONDANSETRON HCL 4 MG/2ML IJ SOLN
INTRAMUSCULAR | Status: DC | PRN
  Administered 2013-10-22: 4 mg via INTRAVENOUS

## 2013-10-22 MED ORDER — METOCLOPRAMIDE HCL 5 MG/ML IJ SOLN: 5.0000 mg | Freq: Three times a day (TID) | INTRAMUSCULAR | Status: DC | PRN

## 2013-10-22 MED ORDER — HYDROMORPHONE HCL PF 1 MG/ML IJ SOLN
0.5000 mg | INTRAMUSCULAR | Status: DC | PRN
  Administered 2013-10-23: 0.5 mg via INTRAVENOUS
  Filled 2013-10-22: qty 1

## 2013-10-22 MED ORDER — CHLORHEXIDINE GLUCONATE 4 % EX LIQD: 60.0000 mL | Freq: Once | CUTANEOUS | Status: DC

## 2013-10-22 MED ORDER — ZOLPIDEM TARTRATE 10 MG PO TABS
10.0000 mg | ORAL_TABLET | Freq: Every evening | ORAL | Status: DC | PRN
  Administered 2013-10-22 – 2013-10-24 (×3): 10 mg via ORAL
  Filled 2013-10-22 (×3): qty 1

## 2013-10-22 MED ORDER — METHOCARBAMOL 500 MG PO TABS
500.0000 mg | ORAL_TABLET | Freq: Four times a day (QID) | ORAL | Status: DC | PRN
  Administered 2013-10-22 – 2013-10-25 (×5): 500 mg via ORAL
  Filled 2013-10-22 (×5): qty 1

## 2013-10-22 MED ORDER — DEXTROSE 5 % IV SOLN
500.0000 mg | Freq: Four times a day (QID) | INTRAVENOUS | Status: DC | PRN
  Administered 2013-10-22: 500 mg via INTRAVENOUS
  Filled 2013-10-22: qty 5

## 2013-10-22 MED ORDER — FENTANYL CITRATE 0.05 MG/ML IJ SOLN
INTRAMUSCULAR | Status: DC | PRN
  Administered 2013-10-22 (×5): 50 ug via INTRAVENOUS

## 2013-10-22 MED ORDER — FERROUS SULFATE 325 (65 FE) MG PO TABS
325.0000 mg | ORAL_TABLET | Freq: Three times a day (TID) | ORAL | Status: DC
Start: 2013-10-22 — End: 2013-10-25
  Administered 2013-10-22 – 2013-10-24 (×5): 325 mg via ORAL
  Filled 2013-10-22 (×11): qty 1

## 2013-10-22 MED ORDER — ONDANSETRON HCL 4 MG/2ML IJ SOLN
4.0000 mg | Freq: Four times a day (QID) | INTRAMUSCULAR | Status: DC | PRN
  Administered 2013-10-23: 4 mg via INTRAVENOUS
  Filled 2013-10-22: qty 2

## 2013-10-22 MED ORDER — PANTOPRAZOLE SODIUM 40 MG PO TBEC
40.0000 mg | DELAYED_RELEASE_TABLET | Freq: Every day | ORAL | Status: DC
  Administered 2013-10-23 – 2013-10-25 (×3): 40 mg via ORAL
  Filled 2013-10-22 (×3): qty 1

## 2013-10-22 MED ORDER — ESCITALOPRAM OXALATE 10 MG PO TABS
10.0000 mg | ORAL_TABLET | Freq: Every morning | ORAL | Status: DC
  Administered 2013-10-23 – 2013-10-25 (×3): 10 mg via ORAL
  Filled 2013-10-22 (×3): qty 1

## 2013-10-22 MED ORDER — HYDROCHLOROTHIAZIDE 12.5 MG PO CAPS
12.5000 mg | ORAL_CAPSULE | Freq: Every day | ORAL | Status: DC
  Administered 2013-10-22 – 2013-10-25 (×3): 12.5 mg via ORAL
  Filled 2013-10-22 (×4): qty 1

## 2013-10-22 MED ORDER — ONDANSETRON HCL 4 MG PO TABS: 4.0000 mg | ORAL_TABLET | Freq: Four times a day (QID) | ORAL | Status: DC | PRN

## 2013-10-22 MED ORDER — TRANEXAMIC ACID 100 MG/ML IV SOLN
1000.0000 mg | Freq: Once | INTRAVENOUS | Status: AC
  Administered 2013-10-22: 1000 mg via INTRAVENOUS
  Filled 2013-10-22: qty 10

## 2013-10-22 MED ORDER — POLYETHYLENE GLYCOL 3350 17 G PO PACK
17.0000 g | PACK | Freq: Two times a day (BID) | ORAL | Status: DC
  Administered 2013-10-22 – 2013-10-25 (×4): 17 g via ORAL

## 2013-10-22 MED ORDER — LIDOCAINE HCL (CARDIAC) 20 MG/ML IV SOLN
INTRAVENOUS | Status: DC | PRN
  Administered 2013-10-22: 50 mg via INTRAVENOUS

## 2013-10-22 MED ORDER — NEOSTIGMINE METHYLSULFATE 10 MG/10ML IV SOLN
INTRAVENOUS | Status: DC | PRN
  Administered 2013-10-22: 3 mg via INTRAVENOUS

## 2013-10-22 MED ORDER — DEXAMETHASONE SODIUM PHOSPHATE 10 MG/ML IJ SOLN
10.0000 mg | Freq: Once | INTRAMUSCULAR | Status: AC
  Administered 2013-10-23: 10 mg via INTRAVENOUS
  Filled 2013-10-22: qty 1

## 2013-10-22 MED ORDER — LACTATED RINGERS IV SOLN: INTRAVENOUS | Status: DC

## 2013-10-22 MED ORDER — ALUM & MAG HYDROXIDE-SIMETH 200-200-20 MG/5ML PO SUSP: 30.0000 mL | ORAL | Status: DC | PRN

## 2013-10-22 MED ORDER — DEXAMETHASONE SODIUM PHOSPHATE 10 MG/ML IJ SOLN: 10.0000 mg | Freq: Once | INTRAMUSCULAR | Status: DC

## 2013-10-22 MED ORDER — ONDANSETRON HCL 4 MG/2ML IJ SOLN
INTRAMUSCULAR | Status: AC
  Filled 2013-10-22: qty 2

## 2013-10-22 MED ORDER — MENTHOL 3 MG MT LOZG
1.0000 | LOZENGE | OROMUCOSAL | Status: DC | PRN
  Filled 2013-10-22: qty 9

## 2013-10-22 MED ORDER — HYDROMORPHONE HCL PF 1 MG/ML IJ SOLN
INTRAMUSCULAR | Status: AC
  Filled 2013-10-22: qty 1

## 2013-10-22 MED ORDER — METOCLOPRAMIDE HCL 10 MG PO TABS: 5.0000 mg | ORAL_TABLET | Freq: Three times a day (TID) | ORAL | Status: DC | PRN

## 2013-10-22 MED ORDER — POTASSIUM CHLORIDE 2 MEQ/ML IV SOLN
100.0000 mL/h | INTRAVENOUS | Status: DC
  Administered 2013-10-22 – 2013-10-23 (×2): 100 mL/h via INTRAVENOUS
  Filled 2013-10-22 (×4): qty 1000

## 2013-10-22 MED ORDER — ASPIRIN EC 325 MG PO TBEC
325.0000 mg | DELAYED_RELEASE_TABLET | Freq: Two times a day (BID) | ORAL | Status: DC
  Administered 2013-10-23 – 2013-10-25 (×5): 325 mg via ORAL
  Filled 2013-10-22 (×7): qty 1

## 2013-10-22 MED ORDER — LATANOPROST 0.005 % OP SOLN
1.0000 [drp] | Freq: Every day | OPHTHALMIC | Status: DC
  Administered 2013-10-22 – 2013-10-24 (×3): 1 [drp] via OPHTHALMIC
  Filled 2013-10-22: qty 2.5

## 2013-10-22 MED ORDER — IRBESARTAN 150 MG PO TABS
150.0000 mg | ORAL_TABLET | Freq: Every day | ORAL | Status: DC
  Administered 2013-10-22 – 2013-10-25 (×3): 150 mg via ORAL
  Filled 2013-10-22 (×4): qty 1

## 2013-10-22 MED ORDER — FENTANYL CITRATE 0.05 MG/ML IJ SOLN
INTRAMUSCULAR | Status: AC
  Filled 2013-10-22: qty 5

## 2013-10-22 MED ORDER — CEFAZOLIN SODIUM-DEXTROSE 2-3 GM-% IV SOLR
2.0000 g | INTRAVENOUS | Status: AC
  Administered 2013-10-22: 2 g via INTRAVENOUS

## 2013-10-22 SURGICAL SUPPLY — 42 items
ADH SKN CLS APL DERMABOND .7 (GAUZE/BANDAGES/DRESSINGS) ×1
BAG SPEC THK2 15X12 ZIP CLS (MISCELLANEOUS)
BAG ZIPLOCK 12X15 (MISCELLANEOUS) IMPLANT
CAPT HIP PF MOP ×2 IMPLANT
COVER PERINEAL POST (MISCELLANEOUS) ×3 IMPLANT
DERMABOND ADVANCED (GAUZE/BANDAGES/DRESSINGS) ×2
DERMABOND ADVANCED .7 DNX12 (GAUZE/BANDAGES/DRESSINGS) ×1 IMPLANT
DRAPE C-ARM 42X120 X-RAY (DRAPES) ×3 IMPLANT
DRAPE STERI IOBAN 125X83 (DRAPES) ×3 IMPLANT
DRAPE U-SHAPE 47X51 STRL (DRAPES) ×9 IMPLANT
DRSG AQUACEL AG ADV 3.5X10 (GAUZE/BANDAGES/DRESSINGS) ×3 IMPLANT
DURAPREP 26ML APPLICATOR (WOUND CARE) ×3 IMPLANT
ELECT BLADE TIP CTD 4 INCH (ELECTRODE) ×3 IMPLANT
FACESHIELD WRAPAROUND (MASK) ×9 IMPLANT
FACESHIELD WRAPAROUND OR TEAM (MASK) ×4 IMPLANT
GLOVE BIO SURGEON STRL SZ8 (GLOVE) ×2 IMPLANT
GLOVE BIOGEL PI IND STRL 7.5 (GLOVE) ×1 IMPLANT
GLOVE BIOGEL PI IND STRL 8 (GLOVE) ×1 IMPLANT
GLOVE BIOGEL PI IND STRL 8.5 (GLOVE) IMPLANT
GLOVE BIOGEL PI INDICATOR 7.5 (GLOVE) ×2
GLOVE BIOGEL PI INDICATOR 8 (GLOVE) ×4
GLOVE BIOGEL PI INDICATOR 8.5 (GLOVE) ×2
GLOVE ECLIPSE 8.0 STRL XLNG CF (GLOVE) ×5 IMPLANT
GLOVE ORTHO TXT STRL SZ7.5 (GLOVE) ×4 IMPLANT
GLOVE SURG SS PI 8.0 STRL IVOR (GLOVE) ×2 IMPLANT
GOWN BRE IMP PREV XXLGXLNG (GOWN DISPOSABLE) ×2 IMPLANT
GOWN SPEC L3 XXLG W/TWL (GOWN DISPOSABLE) ×5 IMPLANT
GOWN STRL REUS W/TWL LRG LVL3 (GOWN DISPOSABLE) ×3 IMPLANT
GOWN STRL REUS W/TWL XL LVL3 (GOWN DISPOSABLE) ×2 IMPLANT
KIT BASIN OR (CUSTOM PROCEDURE TRAY) ×3 IMPLANT
PACK TOTAL JOINT (CUSTOM PROCEDURE TRAY) ×3 IMPLANT
PADDING CAST COTTON 6X4 STRL (CAST SUPPLIES) ×3 IMPLANT
SAW OSC TIP CART 19.5X105X1.3 (SAW) ×3 IMPLANT
SUT MNCRL AB 4-0 PS2 18 (SUTURE) ×3 IMPLANT
SUT VIC AB 1 CT1 36 (SUTURE) ×9 IMPLANT
SUT VIC AB 2-0 CT1 27 (SUTURE) ×6
SUT VIC AB 2-0 CT1 TAPERPNT 27 (SUTURE) ×2 IMPLANT
SUT VLOC 180 0 24IN GS25 (SUTURE) ×3 IMPLANT
TOWEL OR 17X26 10 PK STRL BLUE (TOWEL DISPOSABLE) ×3 IMPLANT
TOWEL OR NON WOVEN STRL DISP B (DISPOSABLE) ×2 IMPLANT
TRAY FOLEY CATH 14FRSI W/METER (CATHETERS) ×3 IMPLANT
WATER STERILE IRR 1500ML POUR (IV SOLUTION) ×2 IMPLANT

## 2013-10-22 NOTE — Transfer of Care (Signed)
Immediate Anesthesia Transfer of Care Note  Patient: Martha Hendrix  Procedure(s) Performed: Procedure(s) (LRB): LEFT TOTAL HIP ARTHROPLASTY ANTERIOR APPROACH (Left)  Patient Location: PACU  Anesthesia Type: General  Level of Consciousness: sedated, patient cooperative and responds to stimulation  Airway & Oxygen Therapy: Patient Spontanous Breathing and Patient connected to face mask oxgen  Post-op Assessment: Report given to PACU RN and Post -op Vital signs reviewed and stable  Post vital signs: Reviewed and stable  Complications: No apparent anesthesia complications

## 2013-10-22 NOTE — Interval H&P Note (Signed)
History and Physical Interval Note:  10/22/2013 10:00 AM  Martha Hendrix  has presented today for surgery, with the diagnosis of Osteoarthritis of the left hip  The various methods of treatment have been discussed with the patient and family. After consideration of risks, benefits and other options for treatment, the patient has consented to  Procedure(s): LEFT TOTAL HIP ARTHROPLASTY ANTERIOR APPROACH (Left) as a surgical intervention .  The patient's history has been reviewed, patient examined, no change in status, stable for surgery.  I have reviewed the patient's chart and labs.  Questions were answered to the patient's satisfaction.     Mauri Pole

## 2013-10-22 NOTE — Anesthesia Postprocedure Evaluation (Signed)
  Anesthesia Post-op Note  Patient: Martha Hendrix  Procedure(s) Performed: Procedure(s) (LRB): LEFT TOTAL HIP ARTHROPLASTY ANTERIOR APPROACH (Left)  Patient Location: PACU  Anesthesia Type: General  Level of Consciousness: awake and alert   Airway and Oxygen Therapy: Patient Spontanous Breathing  Post-op Pain: mild  Post-op Assessment: Post-op Vital signs reviewed, Patient's Cardiovascular Status Stable, Respiratory Function Stable, Patent Airway and No signs of Nausea or vomiting  Last Vitals:  Filed Vitals:   10/22/13 1256  BP:   Pulse:   Temp: 36.6 C  Resp:     Post-op Vital Signs: stable   Complications: No apparent anesthesia complications

## 2013-10-22 NOTE — Op Note (Signed)
NAME:  Martha Hendrix NO.: [id number]      MEDICAL RECORD NO.: [id number]      FACILITY:  [facility]      PHYSICIAN:  Mauri Pole  DATE OF BIRTH:  [birth year]     DATE OF PROCEDURE:  10/22/2013                                 OPERATIVE REPORT         PREOPERATIVE DIAGNOSIS: Left  hip osteoarthritis.      POSTOPERATIVE DIAGNOSIS:  Left hip osteoarthritis.      PROCEDURE:  Left total hip replacement through an anterior approach   utilizing DePuy THR system, component size 53mm Gription pinnacle cup, a size 36+4 neutral   Altrex liner, a size 5 Hi Tri Lock stem with a 36+1.5 Articuleze metal ball.      SURGEON:  Pietro Cassis. Alvan Dame, M.D.      ASSISTANT:  Danae Orleans, PA-C     ANESTHESIA:  General.      SPECIMENS:  None.      COMPLICATIONS:  None.      BLOOD LOSS:  300 cc     DRAINS:  None.      INDICATION OF THE PROCEDURE:  Martha Hendrix is a 80 y.o. female who had   presented to office for evaluation of left hip pain.  Radiographs revealed   progressive degenerative changes with bone-on-bone   articulation to the  hip joint.  The patient had painful limited range of   motion significantly affecting their overall quality of life.  The patient was failing to    respond to conservative measures, and at this point was ready   to proceed with more definitive measures.  The patient has noted progressive   degenerative changes in his hip, progressive problems and dysfunction   with regarding the hip prior to surgery.  Consent was obtained for   benefit of pain relief.  Specific risk of infection, DVT, component   failure, dislocation, need for revision surgery, as well discussion of   the anterior versus posterior approach were reviewed.  Consent was   obtained for benefit of anterior pain relief through an anterior   approach.      PROCEDURE IN DETAIL:  The patient was brought to operative theater.   Once adequate  anesthesia, preoperative antibiotics, 2gm Ancef administered.   The patient was positioned supine on the OSI Hanna table.  Once adequate   padding of boney process was carried out, we had predraped out the hip, and  used fluoroscopy to confirm orientation of the pelvis and position.      The left hip was then prepped and draped from proximal iliac crest to   mid thigh with shower curtain technique.      Time-out was performed identifying the patient, planned procedure, and   extremity.     An incision was then made 2 cm distal and lateral to the   anterior superior iliac spine extending over the orientation of the   tensor fascia lata muscle and sharp dissection was carried down to the   fascia of the muscle and protractor placed in the soft tissues.      The fascia was then incised.  The muscle belly was identified and swept  laterally and retractor placed along the superior neck.  Following   cauterization of the circumflex vessels and removing some pericapsular   fat, a second cobra retractor was placed on the inferior neck.  A third   retractor was placed on the anterior acetabulum after elevating the   anterior rectus.  A L-capsulotomy was along the line of the   superior neck to the trochanteric fossa, then extended proximally and   distally.  Tag sutures were placed and the retractors were then placed   intracapsular.  We then identified the trochanteric fossa and   orientation of my neck cut, confirmed this radiographically   and then made a neck osteotomy with the femur on traction.  The femoral   head was removed without difficulty or complication.  Traction was let   off and retractors were placed posterior and anterior around the   acetabulum.      The labrum and foveal tissue were debrided.  I began reaming with a 93mm   reamer and reamed up to 26mm reamer with good bony bed preparation and a 54   cup was chosen.  The final 10mm Gription (due to osteopenic nature of her  bone) Pinnacle cup was then impacted under fluoroscopy  to confirm the depth of penetration and orientation with respect to   abduction.  A screw was placed followed by the hole eliminator.  The final   36+4 neutral Altrex liner was impacted with good visualized rim fit.  The cup was positioned anatomically within the acetabular portion of the pelvis.      At this point, the femur was rolled at 80 degrees.  Further capsule was   released off the inferior aspect of the femoral neck.  I then   released the superior capsule proximally.  The hook was placed laterally   along the femur and elevated manually and held in position with the bed   hook.  The leg was then extended and adducted with the leg rolled to 100   degrees of external rotation.  Once the proximal femur was fully   exposed, I used a box osteotome to set orientation.  I then began   broaching with the starting chili pepper broach and passed this by hand and then broached up to 5.  With the 5 broach in place I chose a high offset neck and did a trial reduction.  The offset was appropriate, leg lengths   appeared to be equal, confirmed radiographically.   Given these findings, I went ahead and dislocated the hip, repositioned all   retractors and positioned the right hip in the extended and abducted position.  The final 5 hi Tri Lock stem was   chosen and it was impacted down to the level of neck cut.  Based on this   and the trial reduction, a 36+1.5 Articuleze metal ball was chosen and   impacted onto a clean and dry trunnion, and the hip was reduced.  The   hip had been irrigated throughout the case again at this point.  I did   reapproximate the superior capsular leaflet to the anterior leaflet   using #1 Vicryl.  The fascia of the   tensor fascia lata muscle was then reapproximated using #1 Vicryl and #0 V-lock sutures.  The   remaining wound was closed with 2-0 Vicryl and running 4-0 Monocryl.   The hip was cleaned, dried, and  dressed sterilely using Dermabond and   Aquacel dressing.  She  was then brought   to recovery room in stable condition tolerating the procedure well.    Danae Orleans, PA-C was present for the entirety of the case involved from   preoperative positioning, perioperative retractor management, general   facilitation of the case, as well as primary wound closure as assistant.            Pietro Cassis Alvan Dame, M.D.        10/22/2013 12:26 PM

## 2013-10-22 NOTE — Anesthesia Preprocedure Evaluation (Addendum)
Anesthesia Evaluation  Patient identified by MRN, date of birth, ID band Patient awake    Reviewed: Allergy & Precautions, H&P , NPO status , Patient's Chart, lab work & pertinent test results, reviewed documented beta blocker date and time   History of Anesthesia Complications (+) PONV  Airway Mallampati: II TM Distance: >3 FB Neck ROM: full    Dental  (+) Caps, Dental Advisory Given All upper front are capped:   Pulmonary neg pulmonary ROS,  breath sounds clear to auscultation  Pulmonary exam normal       Cardiovascular Exercise Tolerance: Good hypertension, Pt. on home beta blockers and Pt. on medications Rhythm:regular Rate:Normal     Neuro/Psych Lumbar stenosis negative neurological ROS  negative psych ROS   GI/Hepatic negative GI ROS, Neg liver ROS, GERD-  Medicated and Controlled,  Endo/Other  negative endocrine ROS  Renal/GU negative Renal ROS  negative genitourinary   Musculoskeletal   Abdominal   Peds  Hematology negative hematology ROS (+)   Anesthesia Other Findings   Reproductive/Obstetrics negative OB ROS                          Anesthesia Physical Anesthesia Plan  ASA: II  Anesthesia Plan: General   Post-op Pain Management:    Induction: Intravenous  Airway Management Planned: Oral ETT  Additional Equipment:   Intra-op Plan:   Post-operative Plan: Extubation in OR  Informed Consent: I have reviewed the patients History and Physical, chart, labs and discussed the procedure including the risks, benefits and alternatives for the proposed anesthesia with the patient or authorized representative who has indicated his/her understanding and acceptance.   Dental Advisory Given  Plan Discussed with: CRNA and Surgeon  Anesthesia Plan Comments:         Anesthesia Quick Evaluation

## 2013-10-23 DIAGNOSIS — D500 Iron deficiency anemia secondary to blood loss (chronic): Secondary | ICD-10-CM | POA: Diagnosis not present

## 2013-10-23 DIAGNOSIS — E663 Overweight: Secondary | ICD-10-CM | POA: Diagnosis present

## 2013-10-23 LAB — CBC
HCT: 29.1 % — ABNORMAL LOW (ref 36.0–46.0)
HEMOGLOBIN: 9.5 g/dL — AB (ref 12.0–15.0)
MCH: 29.7 pg (ref 26.0–34.0)
MCHC: 32.6 g/dL (ref 30.0–36.0)
MCV: 90.9 fL (ref 78.0–100.0)
Platelets: 284 10*3/uL (ref 150–400)
RBC: 3.2 MIL/uL — ABNORMAL LOW (ref 3.87–5.11)
RDW: 13.3 % (ref 11.5–15.5)
WBC: 16 10*3/uL — AB (ref 4.0–10.5)

## 2013-10-23 LAB — BASIC METABOLIC PANEL
BUN: 23 mg/dL (ref 6–23)
CHLORIDE: 98 meq/L (ref 96–112)
CO2: 26 mEq/L (ref 19–32)
Calcium: 8.6 mg/dL (ref 8.4–10.5)
Creatinine, Ser: 1.22 mg/dL — ABNORMAL HIGH (ref 0.50–1.10)
GFR calc non Af Amer: 39 mL/min — ABNORMAL LOW (ref >90)
GFR, EST AFRICAN AMERICAN: 45 mL/min — AB (ref >90)
GLUCOSE: 154 mg/dL — AB (ref 70–99)
POTASSIUM: 4.7 meq/L (ref 3.7–5.3)
SODIUM: 134 meq/L — AB (ref 137–147)

## 2013-10-23 MED ORDER — SODIUM CHLORIDE 0.9 % IV BOLUS (SEPSIS)
250.0000 mL | Freq: Once | INTRAVENOUS | Status: AC
  Administered 2013-10-23: 250 mL via INTRAVENOUS

## 2013-10-23 NOTE — Progress Notes (Signed)
OT  Note  Patient Details Name: MARISELA LINE MRN: [id number] DOB: 1926-07-22  Noted plan for rehab at [place]. Will defer OT eval to [employer] D Nare Gaspari 10/23/2013, 10:02 AM

## 2013-10-23 NOTE — Progress Notes (Signed)
   Subjective: 1 Day Post-Op Procedure(s) (LRB): LEFT TOTAL HIP ARTHROPLASTY ANTERIOR APPROACH (Left)   Patient reports pain as mild, pain controlled. No events throughout the night. Denies any CP or SOB. Ready to work with PT.  Objective:   VITALS:   Filed Vitals:   10/23/13  BP: 118/61  Pulse: 67  Temp: 97.7 F (36.5 C)   Resp: 16    Neurovascular intact Dorsiflexion/Plantar flexion intact Incision: dressing C/D/I No cellulitis present Compartment soft  LABS  Recent Labs  10/23/13 0435  HGB 9.5*  HCT 29.1*  WBC 16.0*  PLT 284     Recent Labs  10/23/13 0435  NA 134*  K 4.7  BUN 23  CREATININE 1.22*  GLUCOSE 154*     Assessment/Plan: 1 Day Post-Op Procedure(s) (LRB): LEFT TOTAL HIP ARTHROPLASTY ANTERIOR APPROACH (Left) Foley cath d/c'ed Advance diet Up with therapy D/C IV fluids Discharge to SNF (Rehab at [facility]) eventually, when ready  Expected ABLA  Treated with iron and will observe  Overweight (BMI 25-29.9) Estimated body mass index is 25.32 kg/(m^2) as calculated from the following:   Height as of 10/17/13: 5\' 4"  (1.626 m).   Weight as of 07/09/13: 66.951 kg (147 lb 9.6 oz). Patient also counseled that weight may inhibit the healing process Patient counseled that losing weight will help with future health issues       [place] Pugh. Pixie Burgener   PAC  10/23/2013, 9:32 AM

## 2013-10-23 NOTE — Progress Notes (Signed)
Spoke with Danae Orleans PA concerning that patient has only voided 25 ml since foley removal this morning, bladder scanned patient and only got 250 ml, order for normal saline bolus of 250 ml once. Patient also has complaints of feeling of a pill being stuck in her throat from earlier this morning, patient with no difficulty breathing, no change in skin color, no difficulty swallowing, PA stated to continue to monitor patient Neta Mends RN 10-23-2013 16:12pm

## 2013-10-23 NOTE — Progress Notes (Signed)
Paged PA Danae Orleans regarding that patient was given 250 ml normal saline bolus and still unable to void, bladder scanned patient again and got 550 ml reading from scanner, awaiting callback Neta Mends RN 10-23-2013 18:52pm

## 2013-10-23 NOTE — Progress Notes (Signed)
Physical Therapy Treatment Patient Details Name: Martha Hendrix MRN: [id number] DOB: [birth year] Today's Date: Nov 08, 2013    History of Present Illness s/p L DA THA    PT Comments    Pt progressing although slowly due to pain and with N/V this pm, RN aware; pt c/o sever pain L hip-ic eto hip after mobility  Follow Up Recommendations  SNF     Equipment Recommendations  None recommended by PT    Recommendations for Other Services OT consult     Precautions / Restrictions Precautions Precautions: Fall Restrictions Weight Bearing Restrictions: No Other Position/Activity Restrictions: WBAT    Mobility  Bed Mobility Overal bed mobility: +2 for physical assistance;Needs Assistance Bed Mobility: Sit to Supine       Sit to supine: Min assist;+2 for physical assistance   General bed mobility comments: cues for sequence and use of R LE, UEs to self assist  Transfers Overall transfer level: Needs assistance Equipment used: Rolling walker (2 wheeled) Transfers: Sit to/from [facility] Sit to Stand: Mod assist;Min assist Stand pivot transfers: Min assist       General transfer comment: cues for LE management and use of UEs to self assist  Ambulation/Gait Ambulation/Gait assistance: Min assist Ambulation Distance (Feet): 5 Feet (x 2) Assistive device: Rolling walker (2 wheeled) Gait Pattern/deviations: Step-to pattern;Antalgic;Decreased step length - right;Decreased step length - left Gait velocity: decr   General Gait Details: cues for posture, position from RW and sequence   Stairs            Wheelchair Mobility    Modified Rankin (Stroke Patients Only)       Balance                                    Cognition Arousal/Alertness: Awake/alert Behavior During Therapy: WFL for tasks assessed/performed Overall Cognitive Status: Within Functional Limits for tasks assessed                      Exercises       General Comments        Pertinent Vitals/Pain     Home Living                      Prior Function            PT Goals (current goals can now be found in the care plan section) Acute Rehab PT Goals Patient Stated Goal: Rehab at [facility] and then home to resume previous lifestyle with decreased hip pain Time For Goal Achievement: 10/30/13 Potential to Achieve Goals: Good Progress towards PT goals: Progressing toward goals    Frequency  7X/week    PT Plan Current plan remains appropriate    Co-evaluation             End of Session Equipment Utilized During Treatment: Gait belt Activity Tolerance: Patient tolerated treatment well;Patient limited by pain Patient left: in bed;with call bell/phone within reach     Time: 1455-1520 PT Time Calculation (min): 25 min  Charges:  $Therapeutic Activity: 23-37 mins                    G Codes:      Neil Crouch Nov 08, 2013, 4:05 PM

## 2013-10-23 NOTE — Evaluation (Signed)
Physical Therapy Evaluation Patient Details Name: Martha Hendrix MRN: [id number] DOB: 01-17-1927 Today's Date: 10/23/2013   History of Present Illness     Clinical Impression  Pt s/p L TKR presents with decreased L LE strength/ROM and post op pain limiting functional mobility.  Pt will benefit from follow up rehab at SNF level to maximize IND and safety prior to return home with limited assist.    Follow Up Recommendations SNF    Equipment Recommendations  None recommended by PT    Recommendations for Other Services OT consult     Precautions / Restrictions Precautions Precautions: Fall Restrictions Weight Bearing Restrictions: No Other Position/Activity Restrictions: WBAT      Mobility  Bed Mobility Overal bed mobility: +2 for physical assistance             General bed mobility comments: cues for sequence and use of R LE to self assist  Transfers Overall transfer level: Needs assistance Equipment used: Rolling walker (2 wheeled) Transfers: Sit to/from Stand Sit to Stand: Mod assist;+2 physical assistance         General transfer comment: cues for LE management and use of UEs to self assist  Ambulation/Gait Ambulation/Gait assistance: +2 physical assistance;Mod assist Ambulation Distance (Feet): 14 Feet Assistive device: Rolling walker (2 wheeled) Gait Pattern/deviations: Step-to pattern;Decreased step length - right;Decreased step length - left;Shuffle;Trunk flexed     General Gait Details: cues for posture, position from RW and sequence  Stairs            Wheelchair Mobility    Modified Rankin (Stroke Patients Only)       Balance                                             Pertinent Vitals/Pain 6/10; premed, ice packs provided    Home Living Family/patient expects to be discharged to:: Skilled nursing facility                 Additional Comments: [facility]    Prior Function Level of Independence:  Independent with assistive device(s)               Hand Dominance   Dominant Hand: Right    Extremity/Trunk Assessment   Upper Extremity Assessment: Overall WFL for tasks assessed           Lower Extremity Assessment: LLE deficits/detail   LLE Deficits / Details: 2+/5 hip strength with AAROM at hip to 75 flex and 15 abd  Cervical / Trunk Assessment: Normal  Communication   Communication: No difficulties  Cognition Arousal/Alertness: Awake/alert Behavior During Therapy: WFL for tasks assessed/performed Overall Cognitive Status: Within Functional Limits for tasks assessed                      General Comments      Exercises Total Joint Exercises Ankle Circles/Pumps: AROM;Both;15 reps;Supine Quad Sets: AROM;Both;5 reps;Supine Heel Slides: AAROM;15 reps;Supine;Left Hip ABduction/ADduction: AAROM;Left;15 reps;Supine      Assessment/Plan    PT Assessment Patient needs continued PT services  PT Diagnosis Difficulty walking   PT Problem List Decreased strength;Decreased range of motion;Decreased activity tolerance;Decreased mobility;Decreased knowledge of use of DME;Pain  PT Treatment Interventions DME instruction;Gait training;Functional mobility training;Therapeutic activities;Therapeutic exercise;Patient/family education   PT Goals (Current goals can be found in the Care Plan section) Acute Rehab PT Goals Patient Stated Goal:  Rehab at [facility] and then home to resume previous lifestyle with decreased hip pain PT Goal Formulation: With patient Time For Goal Achievement: 10/30/13 Potential to Achieve Goals: Good    Frequency 7X/week   Barriers to discharge        Co-evaluation               End of Session Equipment Utilized During Treatment: Gait belt Activity Tolerance: Patient tolerated treatment well;Patient limited by pain Patient left: in chair;with call bell/phone within reach;with family/visitor present Nurse Communication:  Mobility status         Time: 1122-1149 PT Time Calculation (min): 27 min   Charges:   PT Evaluation $Initial PT Evaluation Tier I: 1 Procedure PT Treatments $Gait Training: 8-22 mins $Therapeutic Exercise: 8-22 mins   PT G Codes:          [name] 10/23/2013, 12:36 PM

## 2013-10-23 NOTE — Progress Notes (Signed)
Clinical Social Work Department BRIEF PSYCHOSOCIAL ASSESSMENT 10/23/2013  Patient:  Martha Hendrix, Martha Hendrix     Account Number:  [id number]     Admit date:  10/22/2013  Clinical Social Worker:  Lacie Scotts  Date/Time:  10/23/2013 11:32 AM  Referred by:  Physician  Date Referred:  10/23/2013 Referred for  SNF Placement   Other Referral:   Interview type:  Patient Other interview type:    PSYCHOSOCIAL DATA Living Status:  FACILITY Admitted from facility:  [facility] Level of care:  Independent Living Primary support name:  Martha Hendrix Primary support relationship to patient:  CHILD, ADULT Degree of support available:   supportive    CURRENT CONCERNS Current Concerns  Post-Acute Placement   Other Concerns:    SOCIAL WORK ASSESSMENT / PLAN Pt is an 80 yr old female admitted from [place]. CSW met with pt / daughter to assist with d/c planning. This is a planned admission. Pt has made prior arrangements to have ST Rehab [facility] skilled unit following hospital d/c. CSW has contacted [facility] and d/c plan has been confirmed. CSW will continue to follow to assist with d/c planning needs.   Assessment/plan status:  Psychosocial Support/Ongoing Assessment of Needs Other assessment/ plan:   Information/referral to community resources:   Insurance coverage for ambulance transport reviewed.    PATIENT'S/FAMILY'S RESPONSE TO PLAN OF CARE: Pt is looking forward to feeling better and returning to [facility] for rehab.   Werner Lean LCSW [phone]

## 2013-10-23 NOTE — Progress Notes (Signed)
Utilization review completed.  

## 2013-10-23 NOTE — Care Management Note (Signed)
    Page 1 of 1   10/23/2013     12:38:18 PM CARE MANAGEMENT NOTE 10/23/2013  Patient:  Martha Hendrix, Martha Hendrix   Account Number:  [id number]  Date Initiated:  10/23/2013  Documentation initiated by:  [employer]  Subjective/Objective Assessment:   adm: L hip OA/pain; L TOTAL HIP ARHTROPLASTY ANTERIOR APPROACH     Action/Plan:   discharge planning; more acute level at [facility]; Prior arrangement to go to [facility] [place]   Anticipated DC Date:  10/24/2013   Anticipated DC Plan:  [place]  CM consult      Choice offered to / List presented to:             Status of service:  Completed, signed off Medicare Important Message given?  YES (If response is "NO", the following Medicare IM given date fields will be blank) Date Medicare IM given:  10/17/2013 Date Additional Medicare IM given:    Discharge Disposition:  [place]  Per UR Regulation:    If discussed at Long Length of Stay Meetings, dates discussed:    Comments:  10/23/13 12:30 CM reviewed; CSW arranging SNF rehab  (ST rehab at [place]).  IM from Medicare signed pre-operatively.  No other CM needs were communicated. Mariane Masters, BSN, CM [phone].

## 2013-10-24 LAB — CBC
HEMATOCRIT: 27.1 % — AB (ref 36.0–46.0)
HEMOGLOBIN: 9.1 g/dL — AB (ref 12.0–15.0)
MCH: 30.3 pg (ref 26.0–34.0)
MCHC: 33.6 g/dL (ref 30.0–36.0)
MCV: 90.3 fL (ref 78.0–100.0)
Platelets: 261 10*3/uL (ref 150–400)
RBC: 3 MIL/uL — AB (ref 3.87–5.11)
RDW: 13.4 % (ref 11.5–15.5)
WBC: 18.9 10*3/uL — ABNORMAL HIGH (ref 4.0–10.5)

## 2013-10-24 LAB — BASIC METABOLIC PANEL
BUN: 20 mg/dL (ref 6–23)
CO2: 26 mEq/L (ref 19–32)
Calcium: 8.7 mg/dL (ref 8.4–10.5)
Chloride: 94 mEq/L — ABNORMAL LOW (ref 96–112)
Creatinine, Ser: 1.08 mg/dL (ref 0.50–1.10)
GFR calc Af Amer: 53 mL/min — ABNORMAL LOW (ref >90)
GFR, EST NON AFRICAN AMERICAN: 45 mL/min — AB (ref >90)
Glucose, Bld: 131 mg/dL — ABNORMAL HIGH (ref 70–99)
Potassium: 4.2 mEq/L (ref 3.7–5.3)
Sodium: 132 mEq/L — ABNORMAL LOW (ref 137–147)

## 2013-10-24 LAB — URINALYSIS, ROUTINE W REFLEX MICROSCOPIC
BILIRUBIN URINE: NEGATIVE
Glucose, UA: NEGATIVE mg/dL
Hgb urine dipstick: NEGATIVE
Ketones, ur: NEGATIVE mg/dL
Nitrite: NEGATIVE
PH: 5 (ref 5.0–8.0)
Protein, ur: NEGATIVE mg/dL
Specific Gravity, Urine: 1.009 (ref 1.005–1.030)
UROBILINOGEN UA: 0.2 mg/dL (ref 0.0–1.0)

## 2013-10-24 LAB — URINE MICROSCOPIC-ADD ON

## 2013-10-24 MED ORDER — ENSURE COMPLETE PO LIQD: 237.0000 mL | Freq: Two times a day (BID) | ORAL | Status: DC

## 2013-10-24 NOTE — Progress Notes (Signed)
Physical Therapy Treatment Patient Details Name: Martha Hendrix MRN: [id number] DOB: 1927/05/11 Today's Date: 2013-11-22    History of Present Illness s/p L DA THA    PT Comments      Follow Up Recommendations  SNF     Equipment Recommendations  None recommended by PT    Recommendations for Other Services OT consult     Precautions / Restrictions Precautions Precautions: Fall Restrictions Weight Bearing Restrictions: No Other Position/Activity Restrictions: WBAT    Mobility  Bed Mobility Overal bed mobility: Needs Assistance Bed Mobility: Supine to Sit     Supine to sit: Min assist;Mod assist     General bed mobility comments: cues for sequence and use of R LE, UEs to self assist  Transfers Overall transfer level: Needs assistance Equipment used: Rolling walker (2 wheeled) Transfers: Sit to/from Stand Sit to Stand: Min assist;Mod assist Stand pivot transfers: Min assist       General transfer comment: cues for LE management and use of UEs to self assist  Ambulation/Gait Ambulation/Gait assistance: Min assist Ambulation Distance (Feet): 111 Feet Assistive device: Rolling walker (2 wheeled) Gait Pattern/deviations: Step-to pattern;Step-through pattern;Decreased step length - right;Decreased step length - left;Shuffle;Trunk flexed Gait velocity: decr   General Gait Details: cues for posture, position from RW and sequence   Stairs            Wheelchair Mobility    Modified Rankin (Stroke Patients Only)       Balance                                    Cognition Arousal/Alertness: Awake/alert Behavior During Therapy: WFL for tasks assessed/performed Overall Cognitive Status: Within Functional Limits for tasks assessed                      Exercises      General Comments        Pertinent Vitals/Pain 8/10 with activity, ice packs provided, pain meds requested    Home Living                       Prior Function            PT Goals (current goals can now be found in the care plan section) Acute Rehab PT Goals Patient Stated Goal: Rehab at [facility] and then home to resume previous lifestyle with decreased hip pain PT Goal Formulation: With patient Time For Goal Achievement: 10/30/13 Potential to Achieve Goals: Good Progress towards PT goals: Progressing toward goals    Frequency  7X/week    PT Plan Current plan remains appropriate    Co-evaluation             End of Session Equipment Utilized During Treatment: Gait belt Activity Tolerance: Patient tolerated treatment well Patient left: in chair;with call bell/phone within reach     Time: 1047-1105 PT Time Calculation (min): 18 min  Charges:  $Gait Training: 8-22 mins                    G Codes:      Martha Hendrix 11/22/2013, 12:52 PM

## 2013-10-24 NOTE — Progress Notes (Signed)
Patient ID: Martha Hendrix, female   DOB: [birth year], 80 y.o.   MRN: [id number] Subjective: 2 Days Post-Op Procedure(s) (LRB): LEFT TOTAL HIP ARTHROPLASTY ANTERIOR APPROACH (Left)    Patient reports pain as moderate.  Slow moving yesterday with regards to urinary retention issues, some indigestion, pain in hip area  Objective:   VITALS:   Filed Vitals:   10/24/13 0422  BP: 159/75  Pulse: 72  Temp: 97.6 F (36.4 C)  Resp: 16    Neurovascular intact Incision: dressing C/D/I  LABS  Recent Labs  10/23/13 0435 10/24/13 0428  HGB 9.5* 9.1*  HCT 29.1* 27.1*  WBC 16.0* 18.9*  PLT 284 261     Recent Labs  10/23/13 0435 10/24/13 0428  NA 134* 132*  K 4.7 4.2  BUN 23 20  CREATININE 1.22* 1.08  GLUCOSE 154* 131*    No results found for this basename: LABPT, INR,  in the last 72 hours   Assessment/Plan: 2 Days Post-Op Procedure(s) (LRB): LEFT TOTAL HIP ARTHROPLASTY ANTERIOR APPROACH (Left)   Advance diet Up with therapy Discharge to SNF tomorrow  With regards to retention we will continue to monitor with bladder scans, if have to in and out cath a second time today will place foley and arrange for follow up Will check UA to rule this out as source

## 2013-10-24 NOTE — Progress Notes (Signed)
Physical Therapy Treatment Patient Details Name: Martha Hendrix MRN: [id number] DOB: 19-Oct-1926 Today's Date: 2013/10/31    History of Present Illness s/p L DA THA    PT Comments    Progressing well.   Follow Up Recommendations  SNF     Equipment Recommendations  None recommended by PT    Recommendations for Other Services OT consult     Precautions / Restrictions Precautions Precautions: Fall Restrictions Weight Bearing Restrictions: No Other Position/Activity Restrictions: WBAT    Mobility  Bed Mobility Overal bed mobility: Needs Assistance Bed Mobility: Sit to Supine     Supine to sit: Min assist;Mod assist Sit to supine: Min assist   General bed mobility comments: cues for sequence and use of R LE, UEs to self assist  Transfers Overall transfer level: Needs assistance Equipment used: Rolling walker (2 wheeled) Transfers: Sit to/from Stand Sit to Stand: Min assist Stand pivot transfers: Min assist       General transfer comment: cues for LE management and use of UEs to self assist  Ambulation/Gait Ambulation/Gait assistance: Min assist;Min guard Ambulation Distance (Feet): 111 Feet (twice) Assistive device: Rolling walker (2 wheeled) Gait Pattern/deviations: Step-to pattern;Step-through pattern;Shuffle;Trunk flexed Gait velocity: decr   General Gait Details: cues for posture, position from RW, to increase BOS and for initial sequence   Stairs            Wheelchair Mobility    Modified Rankin (Stroke Patients Only)       Balance                                    Cognition Arousal/Alertness: Awake/alert Behavior During Therapy: WFL for tasks assessed/performed Overall Cognitive Status: Within Functional Limits for tasks assessed                      Exercises Total Joint Exercises Ankle Circles/Pumps: AROM;Both;15 reps;Supine Quad Sets: AROM;Both;5 reps;Supine Gluteal Sets: AROM;Both;10  reps;Supine Heel Slides: AAROM;15 reps;Supine;Left Hip ABduction/ADduction: AAROM;Left;15 reps;Supine    General Comments        Pertinent Vitals/Pain 5/10; premed, ice packs provided    Home Living                      Prior Function            PT Goals (current goals can now be found in the care plan section) Acute Rehab PT Goals Patient Stated Goal: Rehab at [facility] and then home to resume previous lifestyle with decreased hip pain PT Goal Formulation: With patient Time For Goal Achievement: 10/30/13 Potential to Achieve Goals: Good Progress towards PT goals: Progressing toward goals    Frequency  7X/week    PT Plan Current plan remains appropriate    Co-evaluation             End of Session Equipment Utilized During Treatment: Gait belt Activity Tolerance: Patient tolerated treatment well Patient left: in bed;with call bell/phone within reach     Time: 1323-1353 PT Time Calculation (min): 30 min  Charges:  $Gait Training: 8-22 mins $Therapeutic Exercise: 8-22 mins                    G Codes:      [name] Oct 31, 2013, 2:17 PM

## 2013-10-24 NOTE — Progress Notes (Signed)
In/Out cath for 500 ml post void residual after voided 200 ml and bladder scanned for 524 ml.Pt states she goes often small amts at home,and has a "little brown pill" she takes to help not have to go so often.Martha Hendrix

## 2013-10-24 NOTE — Progress Notes (Signed)
INITIAL NUTRITION ASSESSMENT  DOCUMENTATION CODES Per approved criteria  -Not Applicable   INTERVENTION: - Ensure Complete BID - Discussed diet therapy for indigestion and provided handouts with RD contact information - Encouraged high protein foods/beverages with every meal to promote strength building - RD to continue to monitor   NUTRITION DIAGNOSIS: Increased nutrient needs related to left hip replacement as evidenced by MD notes.   Goal: Pt to consume >90% of meals/supplements  Monitor:  Weights, labs, intake   Reason for Assessment: Malnutrition screening tool   80 y.o. female  Admitting Dx: S/P hip replacement  ASSESSMENT: Pt with history of pain and functional disability in the left hip due to arthritis, with end stage arthritis in the left hip. Had total left hip replacement 10/22/13.   -Met with pt and daughter -Pt reports having poor appetite PTA -Was eating 2.5 meals/day, usually skips lunch. Eats oatmeal with yogurt and cereal for breakfast and a sandwich/soup for lunch and then some kind of protein with vegetables for dinner -Was drinking Boost occasionally  -C/o significant indigestion yesterday after eating a grilled cheese sandwich and potato soup for lunch, improved today -Did not want much for breakfast this morning -Denies any changes in weight    Height: Ht Readings from Last 1 Encounters:  10/23/13 5' 7" (1.702 m)    Weight: Wt Readings from Last 1 Encounters:  10/23/13 145 lb (65.772 kg)    Ideal Body Weight: 135 lbs   % Ideal Body Weight: 107%  Wt Readings from Last 10 Encounters:  10/23/13 145 lb (65.772 kg)  10/23/13 145 lb (65.772 kg)  10/17/13 145 lb (65.772 kg)  07/09/13 147 lb 9.6 oz (66.951 kg)  10/27/08 151 lb 6.1 oz (68.666 kg)    Usual Body Weight: 145 lbs  % Usual Body Weight: 100%  BMI:  Body mass index is 22.71 kg/(m^2).  Estimated Nutritional Needs: Kcal: 1650-1850 Protein: 80-100g Fluid: 1.6-1.8L/day  Skin:  Left hip incision   Diet Order: General  EDUCATION NEEDS: -Education needs addressed - discussed diet therapy for indigestion    Intake/Output Summary (Last 24 hours) at 10/24/13 1054 Last data filed at 10/24/13 0900  Gross per 24 hour  Intake   1090 ml  Output   1300 ml  Net   -210 ml    Last BM: 5/12  Labs:   Recent Labs Lab 10/17/13 1500 10/23/13 0435 10/24/13 0428  NA 137 134* 132*  K 4.3 4.7 4.2  CL 96 98 94*  CO2 30 26 26  BUN 30* 23 20  CREATININE 1.33* 1.22* 1.08  CALCIUM 9.6 8.6 8.7  GLUCOSE 150* 154* 131*    CBG (last 3)  No results found for this basename: GLUCAP,  in the last 72 hours  Scheduled Meds: . aspirin EC  325 mg Oral BID  . celecoxib  200 mg Oral Q12H  . docusate sodium  100 mg Oral BID  . escitalopram  10 mg Oral q morning - 10a  . ferrous sulfate  325 mg Oral TID PC  . irbesartan  150 mg Oral Daily   And  . hydrochlorothiazide  12.5 mg Oral Daily  . latanoprost  1 drop Both Eyes QHS  . nebivolol  10 mg Oral q morning - 10a  . pantoprazole  40 mg Oral Daily  . polyethylene glycol  17 g Oral BID  . traMADol  50-100 mg Oral 4 times per day    Continuous Infusions:   Past Medical History    Diagnosis Date  . Hypertension     takes Diovan and Bystolic daily  . GERD (gastroesophageal reflux disease)     takes Protonix daily  . PONV (postoperative nausea and vomiting)   . History of bronchitis     last time unknown  . Headache(784.0)     occasionally  . Joint pain   . Joint swelling   . Chronic back pain     stenosis  . Arthritis     osteo  . Bruises easily     on both legs  . History of colon polyps   . Nocturia   . Anemia     as a child and never had a transfusion  . Cataract   . Glaucoma     uses eye drops  . Depression     takes Lexapro daily  . Insomnia     takes Ambien nightly    Past Surgical History  Procedure Laterality Date  . Left arm surgery      d/t break as child  . Appendectomy      as a child   . Joint replacement Right 2001    right  . Total hip arthroplasty Right   . Cataract surgery Left 2014  . Colonoscopy    . Esophagogastroduodenoscopy      with ED  . Epidural block injection    . Lumbar laminectomy/decompression microdiscectomy Left 07/19/2013    Procedure: LUMBAR LAMINECTOMY/DECOMPRESSION MICRODISCECTOMY LUMBAR THREE-FOUR,LUMBAR FOUR-FIVE;  Surgeon: David S Jones, MD;  Location: [facility];  Service: Neurosurgery;  Laterality: Left;  . Total hip arthroplasty Left 10/22/2013    Procedure: LEFT TOTAL HIP ARTHROPLASTY ANTERIOR APPROACH;  Surgeon: Matthew D Olin, MD;  Location: [facility] ORS;  Service: Orthopedics;  Laterality: Left;     Baron MS, RD, LDN [phone] Pager [phone] After Hours Pager  

## 2013-10-24 NOTE — Progress Notes (Signed)
Refused scheduled tramadol and celebrex,d/t c/o fear of nausea returning,denis any pain at this time.Is voiding without difficulty after enc of po fluids.Daylene Posey

## 2013-10-24 NOTE — Progress Notes (Signed)
Clinical Social Work  Per chart review, patient to DC to [facility]. CSW called SNF and left a message in order to update facility on DC plans.  Sindy Messing, LCSW (Coverage for [employer])

## 2013-10-25 ENCOUNTER — Encounter: Payer: Self-pay | Admitting: Geriatric Medicine

## 2013-10-25 ENCOUNTER — Non-Acute Institutional Stay (SKILLED_NURSING_FACILITY): Payer: Medicare Other | Admitting: Geriatric Medicine

## 2013-10-25 DIAGNOSIS — N390 Urinary tract infection, site not specified: Secondary | ICD-10-CM | POA: Diagnosis not present

## 2013-10-25 DIAGNOSIS — K5900 Constipation, unspecified: Secondary | ICD-10-CM | POA: Diagnosis not present

## 2013-10-25 DIAGNOSIS — D500 Iron deficiency anemia secondary to blood loss (chronic): Secondary | ICD-10-CM

## 2013-10-25 DIAGNOSIS — Z96649 Presence of unspecified artificial hip joint: Secondary | ICD-10-CM

## 2013-10-25 MED ORDER — ASPIRIN 325 MG PO TBEC: 325.0000 mg | DELAYED_RELEASE_TABLET | Freq: Two times a day (BID) | ORAL | Status: DC

## 2013-10-25 MED ORDER — DSS 100 MG PO CAPS: 100.0000 mg | ORAL_CAPSULE | Freq: Two times a day (BID) | ORAL | Status: AC

## 2013-10-25 MED ORDER — TIZANIDINE HCL 4 MG PO CAPS: 4.0000 mg | ORAL_CAPSULE | Freq: Three times a day (TID) | ORAL | Status: AC | PRN

## 2013-10-25 MED ORDER — CIPROFLOXACIN HCL 500 MG PO TABS
500.0000 mg | ORAL_TABLET | Freq: Two times a day (BID) | ORAL | Status: DC
  Administered 2013-10-25: 500 mg via ORAL
  Filled 2013-10-25 (×3): qty 1

## 2013-10-25 MED ORDER — FERROUS SULFATE 325 (65 FE) MG PO TABS: 325.0000 mg | ORAL_TABLET | Freq: Two times a day (BID) | ORAL | Status: DC

## 2013-10-25 MED ORDER — ENSURE COMPLETE PO LIQD: 237.0000 mL | Freq: Two times a day (BID) | ORAL | Status: DC

## 2013-10-25 MED ORDER — POLYETHYLENE GLYCOL 3350 17 G PO PACK: 17.0000 g | PACK | Freq: Two times a day (BID) | ORAL | Status: DC

## 2013-10-25 MED ORDER — TRAMADOL HCL 50 MG PO TABS: 50.0000 mg | ORAL_TABLET | Freq: Four times a day (QID) | ORAL | Status: DC

## 2013-10-25 MED ORDER — CIPROFLOXACIN HCL 500 MG PO TABS: 500.0000 mg | ORAL_TABLET | Freq: Two times a day (BID) | ORAL | Status: DC

## 2013-10-25 NOTE — Progress Notes (Signed)
   Subjective: 3 Days Post-Op Procedure(s) (LRB): LEFT TOTAL HIP ARTHROPLASTY ANTERIOR APPROACH (Left)   Patient reports pain as mild, pain controlled. She feels that she is doing much better today than yesterday.  She was having some urinating issues yesterday, she feels that these are much better today. No events throughout the night. Ready to be discharged to rehabilitation.  Objective:   VITALS:   Filed Vitals:   10/25/13 0300  BP: 154/75  Pulse: 70  Temp: 98.7 F (37.1 C)  Resp: 17    Neurovascular intact Dorsiflexion/Plantar flexion intact Incision: dressing C/D/I No cellulitis present Compartment soft  LABS  Recent Labs  10/23/13 0435 10/24/13 0428  HGB 9.5* 9.1*  HCT 29.1* 27.1*  WBC 16.0* 18.9*  PLT 284 261     Recent Labs  10/23/13 0435 10/24/13 0428  NA 134* 132*  K 4.7 4.2  BUN 23 20  CREATININE 1.22* 1.08  GLUCOSE 154* 131*     Assessment/Plan: 3 Days Post-Op Procedure(s) (LRB): LEFT TOTAL HIP ARTHROPLASTY ANTERIOR APPROACH (Left) Up with therapy Discharge to SNF Rehabilitation at [facility] Follow up in 2 weeks at [facility]. Follow up with OLIN,Halford Goetzke D in 2 weeks.  Contact information:  [facility] [address], [place] [phone]    UTI Will start Cipro to treat for UTI      West Pugh. Kahlel Peake   PAC  10/25/2013, 8:15 AM

## 2013-10-25 NOTE — Progress Notes (Signed)
Report called to [facility] spoke with susan. DFranklin RN

## 2013-10-25 NOTE — Progress Notes (Signed)
Pt returned to [facility] for rehab this afternoon. Pt's daughter provided transportation.  Werner Lean LCSW [phone]

## 2013-10-25 NOTE — Progress Notes (Signed)
Physical Therapy Treatment Patient Details Name: Martha Hendrix MRN: [id number] DOB: Jun 15, 1926 Today's Date: Nov 12, 2013    History of Present Illness s/p L DA THA    PT Comments    Motivated and progressing well  Follow Up Recommendations  SNF     Equipment Recommendations  None recommended by PT    Recommendations for Other Services OT consult     Precautions / Restrictions Precautions Precautions: Fall Restrictions Weight Bearing Restrictions: No Other Position/Activity Restrictions: WBAT    Mobility  Bed Mobility Overal bed mobility: Needs Assistance Bed Mobility: Supine to Sit     Supine to sit: Min guard     General bed mobility comments: cues for sequence and use of R LE, UEs to self assist  Transfers Overall transfer level: Needs assistance Equipment used: Rolling walker (2 wheeled) Transfers: Sit to/from Stand Sit to Stand: Min guard         General transfer comment: cues for LE management and use of UEs to self assist  Ambulation/Gait Ambulation/Gait assistance: Min guard Ambulation Distance (Feet): 120 Feet (twice) Assistive device: Rolling walker (2 wheeled) Gait Pattern/deviations: Step-to pattern;Step-through pattern;Decreased step length - right;Decreased step length - left;Shuffle;Trunk flexed     General Gait Details: cues for posture, position from RW, to increase BOS and for initial sequence   Stairs            Wheelchair Mobility    Modified Rankin (Stroke Patients Only)       Balance                                    Cognition Arousal/Alertness: Awake/alert Behavior During Therapy: WFL for tasks assessed/performed Overall Cognitive Status: Within Functional Limits for tasks assessed                      Exercises Total Joint Exercises Ankle Circles/Pumps: AROM;Both;15 reps;Supine Quad Sets: AROM;Both;Supine;10 reps Gluteal Sets: AROM;Both;10 reps;Supine Heel Slides:  AAROM;Supine;Left;20 reps Hip ABduction/ADduction: AAROM;Left;15 reps;Supine    General Comments        Pertinent Vitals/Pain 5/10; premed, ice packs provided    Home Living                      Prior Function            PT Goals (current goals can now be found in the care plan section) Acute Rehab PT Goals Patient Stated Goal: Rehab at [facility] and then home to resume previous lifestyle with decreased hip pain PT Goal Formulation: With patient Time For Goal Achievement: 10/30/13 Potential to Achieve Goals: Good Progress towards PT goals: Progressing toward goals    Frequency  7X/week    PT Plan Current plan remains appropriate    Co-evaluation             End of Session Equipment Utilized During Treatment: Gait belt Activity Tolerance: Patient tolerated treatment well Patient left: in chair;with call bell/phone within reach     Time: 1033-1107 PT Time Calculation (min): 34 min  Charges:  $Gait Training: 8-22 mins $Therapeutic Exercise: 8-22 mins                    G Codes:      [name] 11/12/2013, 12:22 PM

## 2013-10-25 NOTE — Progress Notes (Signed)
Patient ID: Martha Hendrix, female   DOB: December 02, 1926, 80 y.o.   MRN: [id number]   [facility] [phone])  Code Status: Full Code, Living Will, [place]   Name Relation Home Work [place] Daughter [phone]     Hendrix,Martha Martha Hendrix [phone]        Chief Complaint  Patient presents with  . Hospitalization Follow-up    Left THR    HPI: This is an 80 year old female resident of [facility]. Independent living  Section. She has an approximately one-year history of left hip pain. Initially this was thought to be coming from her back, she underwent evaluation and ultimately surgical procedure for decompression. Her hip symptoms persisted. Repeat MRI revealed significant degenerative changes in her hip joints. She was evaluated by Dr. Alvan Dame who noted that she had pain and functional disability due to osteoarthritis of her hip and recommended hip replacement surgery.  On 10/22/2013 patient was electively admitted to hospital and underwent the following surgical procedure: Left total hip arthroplasty, anterior approach. Surgical procedure and postoperative course were uneventful. She began working with physical therapy on postop day 2. Pain was adequately managed. She did exhibit  mild anemia, iron supplement was ordered. Patient tells me she has not taken any iron as of yet. Postop day 2 patient had some difficulty urinating; urinary retention. Cath urine was sent for analysis, patient was started on Cipro. By postop day 3 patient was medically ready for discharge to skilled rehabilitation section at [facility].    Allergies  Allergen Reactions  . Codeine Nausea And Vomiting and Other (See Comments)    "spacy feeling"  . Hydrocodone Nausea And Vomiting    spacey feeling     MEDICATIONS -  Reviewed   DATA REVIEWED  Radiologic Exams:   Cardiovascular Exams:   Laboratory Studies: Lab Results    Component Value Date   WBC 18.9* 10/24/2013   HGB 9.1* 10/24/2013   HCT 27.1* 10/24/2013   MCV 90.3 10/24/2013   PLT 261 10/24/2013   Lab Results  Component Value Date   NA 132* 10/24/2013   K 4.2 10/24/2013   CL 94* 10/24/2013   CO2 26 10/24/2013   GLUCOSE 131* 10/24/2013   BUN 20 10/24/2013   CREATININE 1.08 10/24/2013   CALCIUM 8.7 10/24/2013   GFRNONAA 45* 10/24/2013   GFRAA 53* 10/24/2013   U/A: Yellow, clear, negative nitrites, small leukocytes, rare bacteria       Past Medical History  Diagnosis Date  . Hypertension     takes Diovan and Bystolic daily  . GERD (gastroesophageal reflux disease)     takes Protonix daily  . PONV (postoperative nausea and vomiting)   . History of bronchitis     last time unknown  . Headache(784.0)     occasionally  . Joint pain   . Joint swelling   . Chronic back pain     stenosis  . Arthritis     osteo  . Bruises easily     on both legs  . History of colon polyps   . Nocturia   . Anemia     as a child and never had a transfusion  . Cataract   . Glaucoma     uses eye drops  . Depression     takes Lexapro daily  . Insomnia     takes Ambien nightly   Past Surgical History  Procedure Laterality Date  . Left arm  surgery      d/t break as child  . Appendectomy      as a child  . Joint replacement Right 2001    right  . Total hip arthroplasty Right   . Cataract surgery Left 2014  . Colonoscopy    . Esophagogastroduodenoscopy      with ED  . Epidural block injection    . Lumbar laminectomy/decompression microdiscectomy Left 07/19/2013    Procedure: LUMBAR LAMINECTOMY/DECOMPRESSION MICRODISCECTOMY LUMBAR THREE-FOUR,LUMBAR FOUR-FIVE;  Surgeon: Eustace Moore, MD;  Location: [facility];  Service: Neurosurgery;  Laterality: Left;  . Total hip arthroplasty Left 10/22/2013    Procedure: LEFT TOTAL HIP ARTHROPLASTY ANTERIOR APPROACH;  Surgeon: Mauri Pole, MD;  Location: [facility];  Service: Orthopedics;  Laterality: Left;   No family  status information on file.   History   Social History Narrative  . No narrative on file   REVIEW OF SYSTEMS  DATA OBTAINED: from patient, medical record, family member GENERAL: Feels "OK, sleepy today" daughter reports patient was different this morning, very sleepy. Asks if this could be due to taking 2 Ultram every 6 hours. No appetite since surgery SKIN: No itch, rash or open wounds EYES: No eye pain, dryness or itching  No change in vision EARS: No earache, tinnitus, change in hearing NOSE: No congestion, drainage or bleeding MOUTH/THROAT: No mouth or tooth pain  No sore throat   No difficulty chewing or swallowing RESPIRATORY: No cough, wheezing, SOB CARDIAC: No chest pain, palpitations  No edema. GI: No abdominal pain  No nausea, vomiting,diarrhea. Constipation, no BM since prior to surgery.  No heartburn or reflux  GU: No dysuria, frequency or urgency  No change in urine volume or character. No difficulty voiding today MUSCULOSKELETAL: Left hip pain with movement. Reports she walked with a walker and assistance and supervision in the hospital. Joints are little sore and achy today, she attributes this to no Naprosyn since 2 days prior to surgery  No back pain  No muscle ache, pain, weakness  NEUROLOGIC: No dizziness, fainting, headache, numbness  No change in mental status.  PSYCHIATRIC: No feelings of anxiety, depression  Sleeps well.      PHYSICAL EXAM Filed Vitals:   10/25/13 1603  BP: 159/78  Pulse: 75  Temp: 96.2 F (35.7 C)  Resp: 18  Height: 5\' 3"  (1.6 m)  Weight: 149 lb (67.586 kg)  SpO2: 94%   Body mass index is 26.4 kg/(m^2).  GENERAL APPEARANCE: No acute distress, appropriately groomed, normal body habitus. Alert, pleasant, conversant. SKIN: No diaphoresis, rash, unusual lesions, wounds HEAD: Normocephalic, atraumatic EYES: Conjunctiva/lids clear. Pupils round, reactive. EOMs intact.  EARS: External exam WNL, canals clear, Hearing grossly normal. NOSE: No  deformity or discharge. MOUTH/THROAT: Lips w/o lesions. Oral mucosa, tongue moist, w/o lesion. Oropharynx w/o redness or lesions.  NECK: Supple, full ROM. No thyroid tenderness, enlargement or nodule LYMPHATICS: No head, neck or supraclavicular adenopathy RESPIRATORY: Breathing is even, unlabored. Lung sounds are clear and full.  CARDIOVASCULAR: Heart RRR. No murmur or extra heart sounds  ARTERIAL: No carotid bruit. Carotid pulse 2+.  No pedal pulses palpable  VENOUS: No varicosities. No venous stasis skin changes  EDEMA: No peripheral edema.  GASTROINTESTINAL: Abdomen is soft, non-tender, not distended w/ normal bowel sounds. No hepatic or splenic enlargement. No mass, ventral or inguinal hernia. GENITOURINARY: Bladder non tender, not distended. MUSCULOSKELETAL: Moves UE, Rt LE extremities with full ROM, strength and tone. Limited range of motion and strength  with left lower extremity, is able to flex knee approximately 45. Surgical incision is covered with dressing. Is able to sit up to the side of bed unassisted.  Back is without kyphosis, scoliosis or spinal process tenderness.  NEUROLOGIC: Oriented to time, place, person. Cranial nerves 2-12 grossly intact, speech clear, no tremor.  PSYCHIATRIC: Mood and affect appropriate to situation   ASSESSMENT/PLAN  S/P left THA, AA Patient status post left total hip arthroplasty, anterior approach on 10/22/2013. Pain has been managed with Ultram, patient may be exhibiting some adverse effects from this medication. Will reduce dose to 50 mg every 6 hours as needed, scheduled Tylenol 650 mg t.i.d.  PT and OT have been ordered, patient will ambulate about her world with the assistance and supervision of the nursing staff over the weekend.  Expected blood loss anemia Mild blood loss anemia after hip surgery, for updated lab 10/29/2013  UTI (urinary tract infection) Patient started on course of Cipro for presumed UTI after episode of urinary retention.  Urinalysis is negative for any clear signs of infection. Will DC antibiotic, patient will be monitored closely for signs of urinary tract infection.  Unspecified constipation Patient without BM for the last 4 days, b.i.d. dosing of MiraLax was started in hospital no result yet. Give suppository today    Family/ staff Communication:  Daughter present for visit today   Goals of care:   Maximize functional status, [place]   Labs/tests ordered: 5/19 CBC, BMP   Follow up: Return for As needed.  Mardene Celeste, NP-C [place] [phone]  10/25/2013

## 2013-10-25 NOTE — Discharge Summary (Signed)
Physician Discharge Summary  Patient ID: [name] MRN: [id number] DOB/AGE: 1926-07-03 80 y.o.  Admit date: 10/22/2013 Discharge date:  10/25/2013  Procedures:  Procedure(s) (LRB): LEFT TOTAL HIP ARTHROPLASTY ANTERIOR APPROACH (Left)  Attending Physician:  Dr. Paralee Cancel   Admission Diagnoses:   Left hip OA / pain  Discharge Diagnoses:  Principal Problem:   S/P left THA, AA Active Problems:   Overweight (BMI 25.0-29.9)   Expected blood loss anemia   UTI (urinary tract infection)  Past Medical History  Diagnosis Date  . Hypertension     takes Diovan and Bystolic daily  . GERD (gastroesophageal reflux disease)     takes Protonix daily  . PONV (postoperative nausea and vomiting)   . History of bronchitis     last time unknown  . Headache(784.0)     occasionally  . Joint pain   . Joint swelling   . Chronic back pain     stenosis  . Arthritis     osteo  . Bruises easily     on both legs  . History of colon polyps   . Nocturia   . Anemia     as a child and never had a transfusion  . Cataract   . Glaucoma     uses eye drops  . Depression     takes Lexapro daily  . Insomnia     takes Ambien nightly    HPI: Martha Hendrix, 81 y.o. female, has a history of pain and functional disability in the left hip(s) due to arthritis. Review of her history indicates that this began to bother her about a year ago. She had radiographic evaluation and at that time it was felt that perhaps most of her symptoms were coming from her back. She was subsequently was evaluated by neurosurgeons and ultimately had a surgical evaluation and procedure performed for decompression. Her symptoms persisted and upon repeat evaluation with MRI this revealed significant degenerative changes in her joints. Advanced progressive left hip degenerative changes. In the office today, I reviewed with Ms. Pralle new X-rays, AP of the pelvis and lateral of the left hip, showing a complete loss  of her joint space with significant erosion and defect of the femoral head. This was compared to films from last year in our office. She had a previous MRI that confirmed the degenerative changes but also significant acetabular and femoral neck head stress changes. At this point, given the fact she is in as much discomfort as she is and given the findings here today, arthroplasty is the only option in an effort to provide relief to her. We discussed hip replacement surgery through an anterior approach as compared to what she had been through before, the desired expectations and the risks. Risks, benefits and expectations were discussed with the patient. Risks including but not limited to the risk of anesthesia, blood clots, nerve damage, blood vessel damage, failure of the prosthesis, infection and up to and including death. Patient understand the risks, benefits and expectations and wishes to proceed with surgery.   PCP: No primary provider on file.   Discharged Condition: good  Hospital Course:  Patient underwent the above stated procedure on 10/22/2013. Patient tolerated the procedure well and brought to the recovery room in good condition and subsequently to the floor.  POD #1 BP: 118/61 ; Pulse: 67 ; Temp: 97.7 F (36.5 C) ; Resp: 16  Patient reports pain as mild, pain controlled. No events throughout the night. Denies  any CP or SOB. Ready to work with PT. Neurovascular intact, dorsiflexion/plantar flexion intact, incision: dressing C/D/I, no cellulitis present and compartment soft.   LABS  Basename    HGB  9.5  HCT  29.1   POD #2  BP: 159/75 ; Pulse: 72 ; Temp: 97.6 F (36.4 C) ; Resp: 16  Patient reports pain as moderate. Slow moving yesterday with regards to urinary retention issues, some indigestion, pain in hip area. Neurovascular intact, dorsiflexion/plantar flexion intact, incision: dressing C/D/I, no cellulitis present and compartment soft.   LABS  Basename    HGB  9.1  HCT   27.1   POD #3  BP: 154/75 ; Pulse: 70 ; Temp: 98.7 F (37.1 C) ; Resp: 17  Patient reports pain as mild, pain controlled. She feels that she is doing much better today than yesterday. She was having some urinating issues yesterday, she feels that these are much better today. No events throughout the night. Ready to be discharged to rehabilitation. Neurovascular intact, dorsiflexion/plantar flexion intact, incision: dressing C/D/I, no cellulitis present and compartment soft.   LABS   No new labs  Discharge Exam: General appearance: alert, cooperative and no distress Extremities: Homans sign is negative, no sign of DVT, no edema, redness or tenderness in the calves or thighs and no ulcers, gangrene or trophic changes  Disposition:      Skilled nursing facility with follow up in 2 weeks   Follow-up Information   Follow up with Mauri Pole, MD. Schedule an appointment as soon as possible for a visit in 2 weeks.   Specialty:  Orthopedic Surgery   Contact information:   [address] [place] [address] [phone]       Discharge Instructions   Call MD / Call 911    Complete by:  As directed   If you experience chest pain or shortness of breath, CALL 911 and be transported to the hospital emergency room.  If you develope a fever above 101 F, pus (white drainage) or increased drainage or redness at the wound, or calf pain, call your surgeon's office.     Change dressing    Complete by:  As directed   Maintain surgical dressing for 10-14 days, or until follow up in the clinic.     Constipation Prevention    Complete by:  As directed   Drink plenty of fluids.  Prune juice may be helpful.  You may use a stool softener, such as Colace (over the counter) 100 mg twice a day.  Use MiraLax (over the counter) for constipation as needed.     Diet - low sodium heart healthy    Complete by:  As directed      Discharge instructions    Complete by:  As directed   Maintain  surgical dressing for 10-14 days, or until follow up in the clinic. Follow up in 2 weeks at [facility]. Call with any questions or concerns.     Increase activity slowly as tolerated    Complete by:  As directed      TED hose    Complete by:  As directed   Use stockings (TED hose) for 2 weeks on both leg(s).  You may remove them at night for sleeping.     Weight bearing as tolerated    Complete by:  As directed              Medication List    STOP taking these medications  naproxen 500 MG tablet  Commonly known as:  NAPROSYN      TAKE these medications       acetaminophen 500 MG tablet  Commonly known as:  TYLENOL  Take 1,000 mg by mouth 2 (two) times daily as needed (pain).     aspirin 325 MG EC tablet  Take 1 tablet (325 mg total) by mouth 2 (two) times daily.  Start taking on:  11/21/2013     CALTRATE 600 PLUS-VIT D PO  Take 1 tablet by mouth daily.     ciprofloxacin 500 MG tablet  Commonly known as:  CIPRO  Take 1 tablet (500 mg total) by mouth 2 (two) times daily.     DSS 100 MG Caps  Take 100 mg by mouth 2 (two) times daily.     escitalopram 10 MG tablet  Commonly known as:  LEXAPRO  Take 10 mg by mouth every morning.     feeding supplement (ENSURE COMPLETE) Liqd  Take 237 mLs by mouth 2 (two) times daily between meals.     ferrous sulfate 325 (65 FE) MG tablet  Take 1 tablet (325 mg total) by mouth 2 (two) times daily with a meal.     latanoprost 0.005 % ophthalmic solution  Commonly known as:  XALATAN  Place 1 drop into both eyes at bedtime.     multivitamin with minerals Tabs tablet  Take 1 tablet by mouth daily.     nebivolol 10 MG tablet  Commonly known as:  BYSTOLIC  Take 10 mg by mouth every morning.     pantoprazole 40 MG tablet  Commonly known as:  PROTONIX  Take 40 mg by mouth daily.     polyethylene glycol packet  Commonly known as:  MIRALAX / GLYCOLAX  Take 17 g by mouth 2 (two) times daily.     tiZANidine 4 MG  capsule  Commonly known as:  ZANAFLEX  Take 1 capsule (4 mg total) by mouth 3 (three) times daily as needed for muscle spasms.     traMADol 50 MG tablet  Commonly known as:  ULTRAM  Take 1-2 tablets (50-100 mg total) by mouth every 6 (six) hours.     valsartan-hydrochlorothiazide 160-12.5 MG per tablet  Commonly known as:  DIOVAN-HCT  Take 1 tablet by mouth every morning.     zolpidem 10 MG tablet  Commonly known as:  AMBIEN  Take 10 mg by mouth at bedtime as needed for sleep.           Signed: [place]. Zalea Pete   PAC  10/25/2013, 8:22 AM

## 2013-10-28 ENCOUNTER — Encounter: Payer: Self-pay | Admitting: Geriatric Medicine

## 2013-10-28 DIAGNOSIS — R269 Unspecified abnormalities of gait and mobility: Secondary | ICD-10-CM | POA: Diagnosis not present

## 2013-10-28 DIAGNOSIS — M6281 Muscle weakness (generalized): Secondary | ICD-10-CM | POA: Diagnosis not present

## 2013-10-28 DIAGNOSIS — Z471 Aftercare following joint replacement surgery: Secondary | ICD-10-CM | POA: Diagnosis not present

## 2013-10-28 DIAGNOSIS — Z4789 Encounter for other orthopedic aftercare: Secondary | ICD-10-CM | POA: Diagnosis not present

## 2013-10-28 DIAGNOSIS — R279 Unspecified lack of coordination: Secondary | ICD-10-CM | POA: Diagnosis not present

## 2013-10-28 DIAGNOSIS — K5901 Slow transit constipation: Secondary | ICD-10-CM | POA: Insufficient documentation

## 2013-10-28 DIAGNOSIS — Z96649 Presence of unspecified artificial hip joint: Secondary | ICD-10-CM | POA: Diagnosis not present

## 2013-10-28 NOTE — Assessment & Plan Note (Signed)
Mild blood loss anemia after hip surgery, for updated lab 10/29/2013

## 2013-10-28 NOTE — Assessment & Plan Note (Signed)
Patient status post left total hip arthroplasty, anterior approach on 10/22/2013. Pain has been managed with Ultram, patient may be exhibiting some adverse effects from this medication. Will reduce dose to 50 mg every 6 hours as needed, scheduled Tylenol 650 mg t.i.d.  PT and OT have been ordered, patient will ambulate about her world with the assistance and supervision of the nursing staff over the weekend.

## 2013-10-28 NOTE — Assessment & Plan Note (Addendum)
Patient without BM for the last 4 days, b.i.d. dosing of MiraLax was started in hospital no result yet. Give suppository today

## 2013-10-28 NOTE — Assessment & Plan Note (Signed)
Patient started on course of Cipro for presumed UTI after episode of urinary retention. Urinalysis is negative for any clear signs of infection. Will DC antibiotic, patient will be monitored closely for signs of urinary tract infection.

## 2013-10-29 DIAGNOSIS — R279 Unspecified lack of coordination: Secondary | ICD-10-CM | POA: Diagnosis not present

## 2013-10-29 DIAGNOSIS — E871 Hypo-osmolality and hyponatremia: Secondary | ICD-10-CM | POA: Diagnosis not present

## 2013-10-29 DIAGNOSIS — Z4789 Encounter for other orthopedic aftercare: Secondary | ICD-10-CM | POA: Diagnosis not present

## 2013-10-29 DIAGNOSIS — D500 Iron deficiency anemia secondary to blood loss (chronic): Secondary | ICD-10-CM | POA: Diagnosis not present

## 2013-10-29 DIAGNOSIS — M6281 Muscle weakness (generalized): Secondary | ICD-10-CM | POA: Diagnosis not present

## 2013-10-29 DIAGNOSIS — Z471 Aftercare following joint replacement surgery: Secondary | ICD-10-CM | POA: Diagnosis not present

## 2013-10-29 DIAGNOSIS — Z96649 Presence of unspecified artificial hip joint: Secondary | ICD-10-CM | POA: Diagnosis not present

## 2013-10-29 DIAGNOSIS — R269 Unspecified abnormalities of gait and mobility: Secondary | ICD-10-CM | POA: Diagnosis not present

## 2013-10-30 DIAGNOSIS — R279 Unspecified lack of coordination: Secondary | ICD-10-CM | POA: Diagnosis not present

## 2013-10-30 DIAGNOSIS — R269 Unspecified abnormalities of gait and mobility: Secondary | ICD-10-CM | POA: Diagnosis not present

## 2013-10-30 DIAGNOSIS — Z471 Aftercare following joint replacement surgery: Secondary | ICD-10-CM | POA: Diagnosis not present

## 2013-10-30 DIAGNOSIS — M6281 Muscle weakness (generalized): Secondary | ICD-10-CM | POA: Diagnosis not present

## 2013-10-30 DIAGNOSIS — Z4789 Encounter for other orthopedic aftercare: Secondary | ICD-10-CM | POA: Diagnosis not present

## 2013-10-30 DIAGNOSIS — Z96649 Presence of unspecified artificial hip joint: Secondary | ICD-10-CM | POA: Diagnosis not present

## 2013-10-31 ENCOUNTER — Non-Acute Institutional Stay (SKILLED_NURSING_FACILITY): Payer: Medicare Other | Admitting: Internal Medicine

## 2013-10-31 ENCOUNTER — Encounter: Payer: Self-pay | Admitting: Internal Medicine

## 2013-10-31 DIAGNOSIS — Z4789 Encounter for other orthopedic aftercare: Secondary | ICD-10-CM | POA: Diagnosis not present

## 2013-10-31 DIAGNOSIS — M169 Osteoarthritis of hip, unspecified: Secondary | ICD-10-CM | POA: Diagnosis not present

## 2013-10-31 DIAGNOSIS — K219 Gastro-esophageal reflux disease without esophagitis: Secondary | ICD-10-CM

## 2013-10-31 DIAGNOSIS — R1310 Dysphagia, unspecified: Secondary | ICD-10-CM | POA: Insufficient documentation

## 2013-10-31 DIAGNOSIS — M48061 Spinal stenosis, lumbar region without neurogenic claudication: Secondary | ICD-10-CM | POA: Diagnosis not present

## 2013-10-31 DIAGNOSIS — G8929 Other chronic pain: Secondary | ICD-10-CM | POA: Insufficient documentation

## 2013-10-31 DIAGNOSIS — M549 Dorsalgia, unspecified: Secondary | ICD-10-CM

## 2013-10-31 DIAGNOSIS — Z471 Aftercare following joint replacement surgery: Secondary | ICD-10-CM | POA: Diagnosis not present

## 2013-10-31 DIAGNOSIS — K222 Esophageal obstruction: Secondary | ICD-10-CM

## 2013-10-31 DIAGNOSIS — Z96649 Presence of unspecified artificial hip joint: Secondary | ICD-10-CM | POA: Diagnosis not present

## 2013-10-31 DIAGNOSIS — F329 Major depressive disorder, single episode, unspecified: Secondary | ICD-10-CM

## 2013-10-31 DIAGNOSIS — M6281 Muscle weakness (generalized): Secondary | ICD-10-CM | POA: Diagnosis not present

## 2013-10-31 DIAGNOSIS — R279 Unspecified lack of coordination: Secondary | ICD-10-CM | POA: Diagnosis not present

## 2013-10-31 DIAGNOSIS — M1610 Unilateral primary osteoarthritis, unspecified hip: Secondary | ICD-10-CM | POA: Diagnosis not present

## 2013-10-31 DIAGNOSIS — M1612 Unilateral primary osteoarthritis, left hip: Secondary | ICD-10-CM | POA: Insufficient documentation

## 2013-10-31 DIAGNOSIS — F32A Depression, unspecified: Secondary | ICD-10-CM | POA: Insufficient documentation

## 2013-10-31 DIAGNOSIS — M810 Age-related osteoporosis without current pathological fracture: Secondary | ICD-10-CM

## 2013-10-31 DIAGNOSIS — D62 Acute posthemorrhagic anemia: Secondary | ICD-10-CM | POA: Diagnosis not present

## 2013-10-31 DIAGNOSIS — F3289 Other specified depressive episodes: Secondary | ICD-10-CM

## 2013-10-31 DIAGNOSIS — R269 Unspecified abnormalities of gait and mobility: Secondary | ICD-10-CM | POA: Diagnosis not present

## 2013-10-31 DIAGNOSIS — I10 Essential (primary) hypertension: Secondary | ICD-10-CM

## 2013-10-31 NOTE — Progress Notes (Signed)
Patient ID: Martha Hendrix, female   DOB: 1926-12-07, 80 y.o.   MRN: [id number]    Location:  [facility] of Service: SNF (31)  PCP: Geoffery Lyons, MD  Code Status: Living will, healthcare power of attorney  Extended Emergency Contact Information Primary Emergency Contact: Colbert Ewing [place] Phone: [phone] Relation: Daughter Secondary Emergency Contact: Reid,Faye  [place] Phone: [phone] Relation: Friend  Allergies  Allergen Reactions  . Codeine Nausea And Vomiting and Other (See Comments)    "spacy feeling"  . Hydrocodone Nausea And Vomiting    spacey feeling   . Celebrex [Celecoxib]     Chief Complaint  Patient presents with  . Medical Management of Chronic Issues    New admission to skilled nursing facility following hospitalization    HPI:  Hospitalized 10/20/13 through 10/25/13. Dr. Paralee Cancel did a left total hip replacement because of problems of severe degenerative osteoarthritis of the left hip. Patient has been having pain in the left hip and down the left leg for many months. She lumbar laminectomy in February 2015, but this did not solve the problem. She underwent the left total hip replacement in hopes that this would improve her pains.  She had the usual postoperative anemia. She had some mild postoperative nausea which now seems to be much better.  Her multiple other problems including chronic back pain related to her lumbar spondylosis, lumbar spinal stenosis, and degenerative osteoarthritic changes.  There is a history of esophageal stricture, GERD, and dysphagia.  Other problems include nocturia, glaucoma, depression, chronic insomnia, and senile osteoporosis.  She is now admitted to the [facility] rehabilitation unit for short-term rehabilitation and ultimately return to her apartment.    Past Medical History  Diagnosis Date  . Hypertension     takes Diovan  and Bystolic daily  . GERD (gastroesophageal reflux disease)     takes Protonix daily  . PONV (postoperative nausea and vomiting)   . History of bronchitis     last time unknown  . Headache(784.0)     occasionally  . Joint pain   . Joint swelling   . Chronic back pain     stenosis  . Arthritis     osteo, takes naprosyn daily  . Bruises easily     on both legs  . History of colon polyps   . Nocturia   . Anemia     as a child and never had a transfusion  . Cataract   . Glaucoma     uses eye drops  . Depression     takes Lexapro daily  . Insomnia     takes Ambien nightly  . Osteoarthritis of left hip 2015    Status post left hip replacement 10/20/13  . Esophageal stricture   . Dysphagia   . Adenomatous colon polyp   . Congenital spondylolysis, lumbosacral region   . Spinal stenosis of lumbar region   . Senile osteoporosis   . Acute blood loss anemia     Past Surgical History  Procedure Laterality Date  . Left arm surgery      d/t break as child  . Appendectomy      as a child  . Joint replacement Right 2001    right  . Total hip arthroplasty Right   . Cataract surgery Left 2014  . Colonoscopy    . Esophagogastroduodenoscopy      with ED  . Epidural block injection    .  Lumbar laminectomy/decompression microdiscectomy Left 07/19/2013    Procedure: LUMBAR LAMINECTOMY/DECOMPRESSION MICRODISCECTOMY LUMBAR THREE-FOUR,LUMBAR FOUR-FIVE;  Surgeon: Eustace Moore, MD;  Location: [facility];  Service: Neurosurgery;  Laterality: Left;  . Total hip arthroplasty Left 10/22/2013    Procedure: LEFT TOTAL HIP ARTHROPLASTY ANTERIOR APPROACH;  Surgeon: Mauri Pole, MD;  Location: [facility];  Service: Orthopedics;  Laterality: Left;    CONSULTANTS PCP: Reynaldo Minium GIHenrene Pastor Ophthalmology: Kathrin Penner Dermatology: Rolm Bookbinder Orthopedics: Paralee Cancel Neurosurgery: Sherley Bounds  Social History: History   Social History  . Marital Status: Widowed    Spouse Name: N/A    Number  of Children: N/A  . Years of Education: N/A   Social History Main Topics  . Smoking status: Never Smoker   . Smokeless tobacco: Never Used  . Alcohol Use: Yes     Comment: glass of wine every 3-41months  . Drug Use: No  . Sexual Activity: Yes    Birth Control/ Protection: Post-menopausal   Other Topics Concern  . Not on file   Social History Narrative   Patient is Widowed (2000). Lives in apartment,  Independent Living section at [facility] since 10/2013.   No Smoking history, minimal alcohol history    Continues to drive, attends social activities. Was active with water aerobics prior to hip pain(2014)   Patient has Advanced planning documents: HCPOA, Living Will             Family History Family Status  Relation Status Death Age  . Daughter Alive   . Son Alive   . Daughter Alive   . Son Alive   . Other Deceased       Medications: Patient's Medications  New Prescriptions   No medications on file  Previous Medications   ACETAMINOPHEN (TYLENOL) 325 MG TABLET    Take 650 mg by mouth 3 (three) times daily.   ACETAMINOPHEN (TYLENOL) 500 MG TABLET    Take 1,000 mg by mouth 2 (two) times daily as needed (pain).   ASPIRIN EC 325 MG EC TABLET    Take 1 tablet (325 mg total) by mouth 2 (two) times daily.   CALCIUM-VITAMIN D (CALTRATE 600 PLUS-VIT D PO)    Take 1 tablet by mouth daily.   DOCUSATE SODIUM 100 MG CAPS    Take 100 mg by mouth 2 (two) times daily.   ESCITALOPRAM (LEXAPRO) 10 MG TABLET    Take 10 mg by mouth every morning.    FEEDING SUPPLEMENT, ENSURE COMPLETE, (ENSURE COMPLETE) LIQD    Take 237 mLs by mouth 2 (two) times daily between meals.   FERROUS SULFATE 325 (65 FE) MG TABLET    Take 1 tablet (325 mg total) by mouth 2 (two) times daily with a meal.   LATANOPROST (XALATAN) 0.005 % OPHTHALMIC SOLUTION    Place 1 drop into both eyes at bedtime.   MULTIPLE VITAMIN (MULTIVITAMIN WITH MINERALS) TABS TABLET    Take 1 tablet by mouth daily.     NEBIVOLOL (BYSTOLIC) 10 MG TABLET    Take 10 mg by mouth every morning.    OXYCODONE-ACETAMINOPHEN (PERCOCET/ROXICET) 5-325 MG PER TABLET       PANTOPRAZOLE (PROTONIX) 40 MG TABLET    Take 40 mg by mouth daily.   POLYETHYLENE GLYCOL (MIRALAX / GLYCOLAX) PACKET    Take 17 g by mouth 2 (two) times daily.   TIZANIDINE (ZANAFLEX) 4 MG CAPSULE    Take 1 capsule (4 mg total) by mouth 3 (three) times daily  as needed for muscle spasms.   TRAMADOL (ULTRAM) 50 MG TABLET    Take 50 mg by mouth every 6 (six) hours as needed.   VALSARTAN-HYDROCHLOROTHIAZIDE (DIOVAN-HCT) 160-12.5 MG PER TABLET    Take 1 tablet by mouth every morning.    ZOLPIDEM (AMBIEN) 10 MG TABLET    Take 10 mg by mouth at bedtime as needed for sleep.  Modified Medications   No medications on file  Discontinued Medications   No medications on file    Immunization History  Administered Date(s) Administered  . Td 06/27/2013     Review of Systems  Constitutional: Positive for activity change and fatigue. Negative for fever, chills, diaphoresis and appetite change.  HENT: Negative.   Eyes:       Glaucoma  Respiratory: Negative.   Cardiovascular: Negative for chest pain, palpitations and leg swelling.  Gastrointestinal: Negative.   Endocrine: Negative.   Genitourinary: Negative.   Musculoskeletal:       Chronic back pains. Recent left hip surgery. Generalized osteoarthritis. Osteoporosis.  Skin: Negative.   Allergic/Immunologic: Negative.   Neurological: Negative.   Hematological:       Acute blood loss anemia related to her recent hip surgery  Psychiatric/Behavioral:       History of depression with anxiety that appears to been stabilized with the use of Lexapro.      Filed Vitals:   10/31/13 1836  BP: 146/69  Pulse: 75  Temp: 98.6 F (37 C)  Resp: 19  Height: $Remove'5\' 3"'mYIeGjI$  (1.6 m)  Weight: 142 lb (64.411 kg)  SpO2: 97%   Body mass index is 25.16 kg/(m^2).  Physical Exam  Constitutional: She appears well-developed  and well-nourished. No distress.  HENT:  Right Ear: External ear normal.  Left Ear: External ear normal.  Nose: Nose normal.  Mouth/Throat: Oropharynx is clear and moist. No oropharyngeal exudate.  Eyes: Conjunctivae and EOM are normal. Pupils are equal, round, and reactive to light.  Neck: No JVD present. No tracheal deviation present. No thyromegaly present.  Cardiovascular: Normal rate, regular rhythm, normal heart sounds and intact distal pulses.  Exam reveals no gallop and no friction rub.   No murmur heard. Pulmonary/Chest: No respiratory distress. She has no wheezes. She has no rales. She exhibits no tenderness.  Abdominal: She exhibits no distension and no mass. There is no tenderness.  Musculoskeletal: She exhibits no edema and no tenderness.  Living she walks. Favors the left leg.  Lymphadenopathy:    She has no cervical adenopathy.  Neurological: She is alert. No cranial nerve deficit. Coordination normal.  Skin: No rash noted. No erythema. No pallor.  Fresh scar left hip related to surgery  Psychiatric: She has a normal mood and affect. Her behavior is normal. Judgment and thought content normal.        Labs reviewed: Admission on 10/22/2013, Discharged on 10/25/2013  Component Date Value Ref Range Status  . ABO/RH(D) 10/22/2013 O POS   Final  . Antibody Screen 10/22/2013 NEG   Final  . Sample Expiration 10/22/2013 10/25/2013   Final  . ABO/RH(D) 10/22/2013 O POS   Final  . WBC 10/23/2013 16.0* 4.0 - 10.5 K/uL Final  . RBC 10/23/2013 3.20* 3.87 - 5.11 MIL/uL Final  . Hemoglobin 10/23/2013 9.5* 12.0 - 15.0 g/dL Final  . HCT 10/23/2013 29.1* 36.0 - 46.0 % Final  . MCV 10/23/2013 90.9  78.0 - 100.0 fL Final  . MCH 10/23/2013 29.7  26.0 - 34.0 pg Final  . MCHC 10/23/2013  32.6  30.0 - 36.0 g/dL Final  . RDW 10/23/2013 13.3  11.5 - 15.5 % Final  . Platelets 10/23/2013 284  150 - 400 K/uL Final  . Sodium 10/23/2013 134* 137 - 147 mEq/L Final  . Potassium 10/23/2013 4.7   3.7 - 5.3 mEq/L Final  . Chloride 10/23/2013 98  96 - 112 mEq/L Final  . CO2 10/23/2013 26  19 - 32 mEq/L Final  . Glucose, Bld 10/23/2013 154* 70 - 99 mg/dL Final  . BUN 10/23/2013 23  6 - 23 mg/dL Final  . Creatinine, Ser 10/23/2013 1.22* 0.50 - 1.10 mg/dL Final  . Calcium 10/23/2013 8.6  8.4 - 10.5 mg/dL Final  . GFR calc non Af Amer 10/23/2013 39* >90 mL/min Final  . GFR calc Af Amer 10/23/2013 45* >90 mL/min Final   Comment: (NOTE)                          The eGFR has been calculated using the CKD EPI equation.                          This calculation has not been validated in all clinical situations.                          eGFR's persistently <90 mL/min signify possible Chronic Kidney                          Disease.  . WBC 10/24/2013 18.9* 4.0 - 10.5 K/uL Final  . RBC 10/24/2013 3.00* 3.87 - 5.11 MIL/uL Final  . Hemoglobin 10/24/2013 9.1* 12.0 - 15.0 g/dL Final  . HCT 10/24/2013 27.1* 36.0 - 46.0 % Final  . MCV 10/24/2013 90.3  78.0 - 100.0 fL Final  . MCH 10/24/2013 30.3  26.0 - 34.0 pg Final  . MCHC 10/24/2013 33.6  30.0 - 36.0 g/dL Final  . RDW 10/24/2013 13.4  11.5 - 15.5 % Final  . Platelets 10/24/2013 261  150 - 400 K/uL Final  . Sodium 10/24/2013 132* 137 - 147 mEq/L Final  . Potassium 10/24/2013 4.2  3.7 - 5.3 mEq/L Final  . Chloride 10/24/2013 94* 96 - 112 mEq/L Final  . CO2 10/24/2013 26  19 - 32 mEq/L Final  . Glucose, Bld 10/24/2013 131* 70 - 99 mg/dL Final  . BUN 10/24/2013 20  6 - 23 mg/dL Final  . Creatinine, Ser 10/24/2013 1.08  0.50 - 1.10 mg/dL Final  . Calcium 10/24/2013 8.7  8.4 - 10.5 mg/dL Final  . GFR calc non Af Amer 10/24/2013 45* >90 mL/min Final  . GFR calc Af Amer 10/24/2013 53* >90 mL/min Final   Comment: (NOTE)                          The eGFR has been calculated using the CKD EPI equation.                          This calculation has not been validated in all clinical situations.                          eGFR's persistently <90 mL/min  signify possible Chronic Kidney  Disease.  . Color, Urine 10/24/2013 YELLOW  YELLOW Final  . APPearance 10/24/2013 CLEAR  CLEAR Final  . Specific Gravity, Urine 10/24/2013 1.009  1.005 - 1.030 Final  . pH 10/24/2013 5.0  5.0 - 8.0 Final  . Glucose, UA 10/24/2013 NEGATIVE  NEGATIVE mg/dL Final  . Hgb urine dipstick 10/24/2013 NEGATIVE  NEGATIVE Final  . Bilirubin Urine 10/24/2013 NEGATIVE  NEGATIVE Final  . Ketones, ur 10/24/2013 NEGATIVE  NEGATIVE mg/dL Final  . Protein, ur 45/49/6069 NEGATIVE  NEGATIVE mg/dL Final  . Urobilinogen, UA 10/24/2013 0.2  0.0 - 1.0 mg/dL Final  . Nitrite 59/88/3846 NEGATIVE  NEGATIVE Final  . Leukocytes, UA 10/24/2013 SMALL* NEGATIVE Final  . Squamous Epithelial / LPF 10/24/2013 RARE  RARE Final  . WBC, UA 10/24/2013 3-6  <3 WBC/hpf Final  . Bacteria, UA 10/24/2013 RARE  RARE Final  Hospital Outpatient Visit on 10/17/2013  Component Date Value Ref Range Status  . WBC 10/17/2013 8.1  4.0 - 10.5 K/uL Final  . RBC 10/17/2013 4.00  3.87 - 5.11 MIL/uL Final  . Hemoglobin 10/17/2013 11.8* 12.0 - 15.0 g/dL Final  . HCT 86/81/0843 36.4  36.0 - 46.0 % Final  . MCV 10/17/2013 91.0  78.0 - 100.0 fL Final  . MCH 10/17/2013 29.5  26.0 - 34.0 pg Final  . MCHC 10/17/2013 32.4  30.0 - 36.0 g/dL Final  . RDW 68/90/5652 13.0  11.5 - 15.5 % Final  . Platelets 10/17/2013 309  150 - 400 K/uL Final  . Sodium 10/17/2013 137  137 - 147 mEq/L Final  . Potassium 10/17/2013 4.3  3.7 - 5.3 mEq/L Final  . Chloride 10/17/2013 96  96 - 112 mEq/L Final  . CO2 10/17/2013 30  19 - 32 mEq/L Final  . Glucose, Bld 10/17/2013 150* 70 - 99 mg/dL Final  . BUN 53/73/3135 30* 6 - 23 mg/dL Final  . Creatinine, Ser 10/17/2013 1.33* 0.50 - 1.10 mg/dL Final  . Calcium 69/98/9051 9.6  8.4 - 10.5 mg/dL Final  . GFR calc non Af Amer 10/17/2013 35* >90 mL/min Final  . GFR calc Af Amer 10/17/2013 41* >90 mL/min Final   Comment: (NOTE)                          The eGFR has  been calculated using the CKD EPI equation.                          This calculation has not been validated in all clinical situations.                          eGFR's persistently <90 mL/min signify possible Chronic Kidney                          Disease.  Marland Kitchen Prothrombin Time 10/17/2013 14.1  11.6 - 15.2 seconds Final  . INR 10/17/2013 1.11  0.00 - 1.49 Final  . aPTT 10/17/2013 30  24 - 37 seconds Final  . Color, Urine 10/17/2013 YELLOW  YELLOW Final  . APPearance 10/17/2013 CLEAR  CLEAR Final  . Specific Gravity, Urine 10/17/2013 1.019  1.005 - 1.030 Final  . pH 10/17/2013 5.0  5.0 - 8.0 Final  . Glucose, UA 10/17/2013 NEGATIVE  NEGATIVE mg/dL Final  . Hgb urine dipstick 10/17/2013 NEGATIVE  NEGATIVE Final  . Bilirubin Urine 10/17/2013 NEGATIVE  NEGATIVE Final  . Ketones, ur 10/17/2013 NEGATIVE  NEGATIVE mg/dL Final  . Protein, ur 10/17/2013 NEGATIVE  NEGATIVE mg/dL Final  . Urobilinogen, UA 10/17/2013 0.2  0.0 - 1.0 mg/dL Final  . Nitrite 10/17/2013 NEGATIVE  NEGATIVE Final  . Leukocytes, UA 10/17/2013 TRACE* NEGATIVE Final  . MRSA, PCR 10/17/2013 NEGATIVE  NEGATIVE Final  . Staphylococcus aureus 10/17/2013 POSITIVE* NEGATIVE Final   Comment:                                 The Xpert SA Assay (FDA                          approved for NASAL specimens                          in patients over 60 years of age),                          is one component of                          a comprehensive surveillance                          program.  Test performance has                          been validated by [employer] for patients greater                          than or equal to 49 year old.                          It is not intended                          to diagnose infection nor to                          guide or monitor treatment.  . Squamous Epithelial / LPF 10/17/2013 FEW* RARE Final  . WBC, UA 10/17/2013 0-2  <3 WBC/hpf Final  . Urine-Other  10/17/2013 MUCOUS PRESENT   Final     Assessment/Plan  1. Osteoarthritis of left hip Patient is now post left hip replacement and doing well.  2. Acute blood loss anemia Hemoglobin as low as 9.5. She is on iron. We will continue to monitor this.  3. Senile osteoporosis Calcium and vitamin D.  4. Spinal stenosis of lumbar region Contributes chronic back discomfort. Patient has done well since her back surgery in February 2015.  5. Dysphagia Patient denies any current problems with swallowing.  6. Esophageal stricture History of esophageal dilation, but no current problems.  7. Depression Controlled on Lexapro.  8. Chronic back pain Continue to monitor.  9. Hypertension Controlled  10. GERD (gastroesophageal reflux disease) Controlled

## 2013-11-01 DIAGNOSIS — R279 Unspecified lack of coordination: Secondary | ICD-10-CM | POA: Diagnosis not present

## 2013-11-01 DIAGNOSIS — Z471 Aftercare following joint replacement surgery: Secondary | ICD-10-CM | POA: Diagnosis not present

## 2013-11-01 DIAGNOSIS — M6281 Muscle weakness (generalized): Secondary | ICD-10-CM | POA: Diagnosis not present

## 2013-11-01 DIAGNOSIS — Z96649 Presence of unspecified artificial hip joint: Secondary | ICD-10-CM | POA: Diagnosis not present

## 2013-11-01 DIAGNOSIS — R269 Unspecified abnormalities of gait and mobility: Secondary | ICD-10-CM | POA: Diagnosis not present

## 2013-11-01 DIAGNOSIS — Z4789 Encounter for other orthopedic aftercare: Secondary | ICD-10-CM | POA: Diagnosis not present

## 2013-11-04 DIAGNOSIS — R279 Unspecified lack of coordination: Secondary | ICD-10-CM | POA: Diagnosis not present

## 2013-11-04 DIAGNOSIS — M6281 Muscle weakness (generalized): Secondary | ICD-10-CM | POA: Diagnosis not present

## 2013-11-04 DIAGNOSIS — Z4789 Encounter for other orthopedic aftercare: Secondary | ICD-10-CM | POA: Diagnosis not present

## 2013-11-04 DIAGNOSIS — Z471 Aftercare following joint replacement surgery: Secondary | ICD-10-CM | POA: Diagnosis not present

## 2013-11-04 DIAGNOSIS — R269 Unspecified abnormalities of gait and mobility: Secondary | ICD-10-CM | POA: Diagnosis not present

## 2013-11-04 DIAGNOSIS — Z96649 Presence of unspecified artificial hip joint: Secondary | ICD-10-CM | POA: Diagnosis not present

## 2013-11-05 DIAGNOSIS — Z471 Aftercare following joint replacement surgery: Secondary | ICD-10-CM | POA: Diagnosis not present

## 2013-11-05 DIAGNOSIS — R279 Unspecified lack of coordination: Secondary | ICD-10-CM | POA: Diagnosis not present

## 2013-11-05 DIAGNOSIS — Z96649 Presence of unspecified artificial hip joint: Secondary | ICD-10-CM | POA: Diagnosis not present

## 2013-11-05 DIAGNOSIS — M6281 Muscle weakness (generalized): Secondary | ICD-10-CM | POA: Diagnosis not present

## 2013-11-05 DIAGNOSIS — R269 Unspecified abnormalities of gait and mobility: Secondary | ICD-10-CM | POA: Diagnosis not present

## 2013-11-05 DIAGNOSIS — Z4789 Encounter for other orthopedic aftercare: Secondary | ICD-10-CM | POA: Diagnosis not present

## 2013-11-06 DIAGNOSIS — Z96649 Presence of unspecified artificial hip joint: Secondary | ICD-10-CM | POA: Diagnosis not present

## 2013-11-06 DIAGNOSIS — M6281 Muscle weakness (generalized): Secondary | ICD-10-CM | POA: Diagnosis not present

## 2013-11-06 DIAGNOSIS — Z471 Aftercare following joint replacement surgery: Secondary | ICD-10-CM | POA: Diagnosis not present

## 2013-11-06 DIAGNOSIS — R269 Unspecified abnormalities of gait and mobility: Secondary | ICD-10-CM | POA: Diagnosis not present

## 2013-11-06 DIAGNOSIS — Z4789 Encounter for other orthopedic aftercare: Secondary | ICD-10-CM | POA: Diagnosis not present

## 2013-11-06 DIAGNOSIS — R279 Unspecified lack of coordination: Secondary | ICD-10-CM | POA: Diagnosis not present

## 2013-11-07 DIAGNOSIS — R279 Unspecified lack of coordination: Secondary | ICD-10-CM | POA: Diagnosis not present

## 2013-11-07 DIAGNOSIS — Z471 Aftercare following joint replacement surgery: Secondary | ICD-10-CM | POA: Diagnosis not present

## 2013-11-07 DIAGNOSIS — R269 Unspecified abnormalities of gait and mobility: Secondary | ICD-10-CM | POA: Diagnosis not present

## 2013-11-07 DIAGNOSIS — Z96649 Presence of unspecified artificial hip joint: Secondary | ICD-10-CM | POA: Diagnosis not present

## 2013-11-07 DIAGNOSIS — Z4789 Encounter for other orthopedic aftercare: Secondary | ICD-10-CM | POA: Diagnosis not present

## 2013-11-07 DIAGNOSIS — M6281 Muscle weakness (generalized): Secondary | ICD-10-CM | POA: Diagnosis not present

## 2013-11-11 DIAGNOSIS — R279 Unspecified lack of coordination: Secondary | ICD-10-CM | POA: Diagnosis not present

## 2013-11-11 DIAGNOSIS — R269 Unspecified abnormalities of gait and mobility: Secondary | ICD-10-CM | POA: Diagnosis not present

## 2013-11-11 DIAGNOSIS — Z471 Aftercare following joint replacement surgery: Secondary | ICD-10-CM | POA: Diagnosis not present

## 2013-11-11 DIAGNOSIS — M6281 Muscle weakness (generalized): Secondary | ICD-10-CM | POA: Diagnosis not present

## 2013-11-21 DIAGNOSIS — R269 Unspecified abnormalities of gait and mobility: Secondary | ICD-10-CM | POA: Diagnosis not present

## 2013-11-21 DIAGNOSIS — R279 Unspecified lack of coordination: Secondary | ICD-10-CM | POA: Diagnosis not present

## 2013-11-21 DIAGNOSIS — Z471 Aftercare following joint replacement surgery: Secondary | ICD-10-CM | POA: Diagnosis not present

## 2013-11-21 DIAGNOSIS — M6281 Muscle weakness (generalized): Secondary | ICD-10-CM | POA: Diagnosis not present

## 2013-11-28 DIAGNOSIS — R269 Unspecified abnormalities of gait and mobility: Secondary | ICD-10-CM | POA: Diagnosis not present

## 2013-11-28 DIAGNOSIS — Z471 Aftercare following joint replacement surgery: Secondary | ICD-10-CM | POA: Diagnosis not present

## 2013-11-28 DIAGNOSIS — M6281 Muscle weakness (generalized): Secondary | ICD-10-CM | POA: Diagnosis not present

## 2013-11-28 DIAGNOSIS — R279 Unspecified lack of coordination: Secondary | ICD-10-CM | POA: Diagnosis not present

## 2013-12-05 DIAGNOSIS — Z96649 Presence of unspecified artificial hip joint: Secondary | ICD-10-CM | POA: Diagnosis not present

## 2013-12-17 DIAGNOSIS — R809 Proteinuria, unspecified: Secondary | ICD-10-CM | POA: Diagnosis not present

## 2013-12-17 DIAGNOSIS — I10 Essential (primary) hypertension: Secondary | ICD-10-CM | POA: Diagnosis not present

## 2013-12-17 DIAGNOSIS — R82998 Other abnormal findings in urine: Secondary | ICD-10-CM | POA: Diagnosis not present

## 2013-12-17 DIAGNOSIS — E785 Hyperlipidemia, unspecified: Secondary | ICD-10-CM | POA: Diagnosis not present

## 2013-12-19 DIAGNOSIS — F329 Major depressive disorder, single episode, unspecified: Secondary | ICD-10-CM | POA: Diagnosis not present

## 2013-12-19 DIAGNOSIS — Z0000 Encounter for general adult medical examination without abnormal findings: Secondary | ICD-10-CM | POA: Diagnosis not present

## 2013-12-19 DIAGNOSIS — I872 Venous insufficiency (chronic) (peripheral): Secondary | ICD-10-CM | POA: Diagnosis not present

## 2013-12-19 DIAGNOSIS — I10 Essential (primary) hypertension: Secondary | ICD-10-CM | POA: Diagnosis not present

## 2013-12-19 DIAGNOSIS — Z23 Encounter for immunization: Secondary | ICD-10-CM | POA: Diagnosis not present

## 2013-12-19 DIAGNOSIS — M1990 Unspecified osteoarthritis, unspecified site: Secondary | ICD-10-CM | POA: Diagnosis not present

## 2013-12-19 DIAGNOSIS — Q762 Congenital spondylolisthesis: Secondary | ICD-10-CM | POA: Diagnosis not present

## 2013-12-19 DIAGNOSIS — E785 Hyperlipidemia, unspecified: Secondary | ICD-10-CM | POA: Diagnosis not present

## 2013-12-19 DIAGNOSIS — IMO0002 Reserved for concepts with insufficient information to code with codable children: Secondary | ICD-10-CM | POA: Diagnosis not present

## 2013-12-26 DIAGNOSIS — D1801 Hemangioma of skin and subcutaneous tissue: Secondary | ICD-10-CM | POA: Diagnosis not present

## 2013-12-26 DIAGNOSIS — D239 Other benign neoplasm of skin, unspecified: Secondary | ICD-10-CM | POA: Diagnosis not present

## 2013-12-26 DIAGNOSIS — L821 Other seborrheic keratosis: Secondary | ICD-10-CM | POA: Diagnosis not present

## 2013-12-26 DIAGNOSIS — D692 Other nonthrombocytopenic purpura: Secondary | ICD-10-CM | POA: Diagnosis not present

## 2013-12-26 DIAGNOSIS — L84 Corns and callosities: Secondary | ICD-10-CM | POA: Diagnosis not present

## 2013-12-26 DIAGNOSIS — Z85828 Personal history of other malignant neoplasm of skin: Secondary | ICD-10-CM | POA: Diagnosis not present

## 2013-12-26 DIAGNOSIS — L819 Disorder of pigmentation, unspecified: Secondary | ICD-10-CM | POA: Diagnosis not present

## 2014-01-17 DIAGNOSIS — H259 Unspecified age-related cataract: Secondary | ICD-10-CM | POA: Diagnosis not present

## 2014-01-17 DIAGNOSIS — H409 Unspecified glaucoma: Secondary | ICD-10-CM | POA: Diagnosis not present

## 2014-01-17 DIAGNOSIS — Z961 Presence of intraocular lens: Secondary | ICD-10-CM | POA: Diagnosis not present

## 2014-01-17 DIAGNOSIS — H4011X0 Primary open-angle glaucoma, stage unspecified: Secondary | ICD-10-CM | POA: Diagnosis not present

## 2014-03-10 DIAGNOSIS — Z23 Encounter for immunization: Secondary | ICD-10-CM | POA: Diagnosis not present

## 2014-03-10 DIAGNOSIS — IMO0002 Reserved for concepts with insufficient information to code with codable children: Secondary | ICD-10-CM | POA: Diagnosis not present

## 2014-03-10 DIAGNOSIS — I8310 Varicose veins of unspecified lower extremity with inflammation: Secondary | ICD-10-CM | POA: Diagnosis not present

## 2014-04-08 DIAGNOSIS — R922 Inconclusive mammogram: Secondary | ICD-10-CM | POA: Diagnosis not present

## 2014-04-08 DIAGNOSIS — R928 Other abnormal and inconclusive findings on diagnostic imaging of breast: Secondary | ICD-10-CM | POA: Diagnosis not present

## 2014-04-08 DIAGNOSIS — Z803 Family history of malignant neoplasm of breast: Secondary | ICD-10-CM | POA: Diagnosis not present

## 2014-04-08 DIAGNOSIS — Z1231 Encounter for screening mammogram for malignant neoplasm of breast: Secondary | ICD-10-CM | POA: Diagnosis not present

## 2014-04-08 DIAGNOSIS — N6489 Other specified disorders of breast: Secondary | ICD-10-CM | POA: Diagnosis not present

## 2014-04-10 DIAGNOSIS — R928 Other abnormal and inconclusive findings on diagnostic imaging of breast: Secondary | ICD-10-CM | POA: Diagnosis not present

## 2014-04-10 DIAGNOSIS — Z803 Family history of malignant neoplasm of breast: Secondary | ICD-10-CM | POA: Diagnosis not present

## 2014-04-10 DIAGNOSIS — Z1231 Encounter for screening mammogram for malignant neoplasm of breast: Secondary | ICD-10-CM | POA: Diagnosis not present

## 2014-04-10 DIAGNOSIS — R922 Inconclusive mammogram: Secondary | ICD-10-CM | POA: Diagnosis not present

## 2014-04-10 DIAGNOSIS — N6489 Other specified disorders of breast: Secondary | ICD-10-CM | POA: Diagnosis not present

## 2014-04-21 DIAGNOSIS — M2041 Other hammer toe(s) (acquired), right foot: Secondary | ICD-10-CM | POA: Diagnosis not present

## 2014-04-21 DIAGNOSIS — M2011 Hallux valgus (acquired), right foot: Secondary | ICD-10-CM | POA: Diagnosis not present

## 2014-06-18 DIAGNOSIS — I10 Essential (primary) hypertension: Secondary | ICD-10-CM | POA: Diagnosis not present

## 2014-06-18 DIAGNOSIS — M1990 Unspecified osteoarthritis, unspecified site: Secondary | ICD-10-CM | POA: Diagnosis not present

## 2014-06-18 DIAGNOSIS — E785 Hyperlipidemia, unspecified: Secondary | ICD-10-CM | POA: Diagnosis not present

## 2014-06-18 DIAGNOSIS — Z6825 Body mass index (BMI) 25.0-25.9, adult: Secondary | ICD-10-CM | POA: Diagnosis not present

## 2014-06-18 DIAGNOSIS — F329 Major depressive disorder, single episode, unspecified: Secondary | ICD-10-CM | POA: Diagnosis not present

## 2014-06-18 DIAGNOSIS — I872 Venous insufficiency (chronic) (peripheral): Secondary | ICD-10-CM | POA: Diagnosis not present

## 2014-07-21 DIAGNOSIS — H6122 Impacted cerumen, left ear: Secondary | ICD-10-CM | POA: Diagnosis not present

## 2014-07-21 DIAGNOSIS — H6063 Unspecified chronic otitis externa, bilateral: Secondary | ICD-10-CM | POA: Diagnosis not present

## 2014-07-24 DIAGNOSIS — H6062 Unspecified chronic otitis externa, left ear: Secondary | ICD-10-CM | POA: Diagnosis not present

## 2014-07-24 DIAGNOSIS — H6122 Impacted cerumen, left ear: Secondary | ICD-10-CM | POA: Diagnosis not present

## 2014-10-09 DIAGNOSIS — Z961 Presence of intraocular lens: Secondary | ICD-10-CM | POA: Diagnosis not present

## 2014-10-09 DIAGNOSIS — H4011X1 Primary open-angle glaucoma, mild stage: Secondary | ICD-10-CM | POA: Diagnosis not present

## 2014-10-09 DIAGNOSIS — H25811 Combined forms of age-related cataract, right eye: Secondary | ICD-10-CM | POA: Diagnosis not present

## 2014-11-13 DIAGNOSIS — L820 Inflamed seborrheic keratosis: Secondary | ICD-10-CM | POA: Diagnosis not present

## 2014-11-13 DIAGNOSIS — L918 Other hypertrophic disorders of the skin: Secondary | ICD-10-CM | POA: Diagnosis not present

## 2014-11-13 DIAGNOSIS — L821 Other seborrheic keratosis: Secondary | ICD-10-CM | POA: Diagnosis not present

## 2014-11-13 DIAGNOSIS — Z85828 Personal history of other malignant neoplasm of skin: Secondary | ICD-10-CM | POA: Diagnosis not present

## 2014-11-26 DIAGNOSIS — H5340 Unspecified visual field defects: Secondary | ICD-10-CM | POA: Diagnosis not present

## 2014-11-26 DIAGNOSIS — Z961 Presence of intraocular lens: Secondary | ICD-10-CM | POA: Diagnosis not present

## 2014-11-26 DIAGNOSIS — H25811 Combined forms of age-related cataract, right eye: Secondary | ICD-10-CM | POA: Diagnosis not present

## 2014-11-26 DIAGNOSIS — H4011X2 Primary open-angle glaucoma, moderate stage: Secondary | ICD-10-CM | POA: Diagnosis not present

## 2014-12-16 DIAGNOSIS — E785 Hyperlipidemia, unspecified: Secondary | ICD-10-CM | POA: Diagnosis not present

## 2014-12-16 DIAGNOSIS — I10 Essential (primary) hypertension: Secondary | ICD-10-CM | POA: Diagnosis not present

## 2014-12-22 DIAGNOSIS — I10 Essential (primary) hypertension: Secondary | ICD-10-CM | POA: Diagnosis not present

## 2014-12-22 DIAGNOSIS — F329 Major depressive disorder, single episode, unspecified: Secondary | ICD-10-CM | POA: Diagnosis not present

## 2014-12-22 DIAGNOSIS — Z1389 Encounter for screening for other disorder: Secondary | ICD-10-CM | POA: Diagnosis not present

## 2014-12-22 DIAGNOSIS — I129 Hypertensive chronic kidney disease with stage 1 through stage 4 chronic kidney disease, or unspecified chronic kidney disease: Secondary | ICD-10-CM | POA: Diagnosis not present

## 2014-12-22 DIAGNOSIS — Z6826 Body mass index (BMI) 26.0-26.9, adult: Secondary | ICD-10-CM | POA: Diagnosis not present

## 2014-12-22 DIAGNOSIS — M1990 Unspecified osteoarthritis, unspecified site: Secondary | ICD-10-CM | POA: Diagnosis not present

## 2014-12-22 DIAGNOSIS — N183 Chronic kidney disease, stage 3 (moderate): Secondary | ICD-10-CM | POA: Diagnosis not present

## 2014-12-22 DIAGNOSIS — E785 Hyperlipidemia, unspecified: Secondary | ICD-10-CM | POA: Diagnosis not present

## 2014-12-22 DIAGNOSIS — Z0000 Encounter for general adult medical examination without abnormal findings: Secondary | ICD-10-CM | POA: Diagnosis not present

## 2015-01-22 DIAGNOSIS — H6061 Unspecified chronic otitis externa, right ear: Secondary | ICD-10-CM | POA: Diagnosis not present

## 2015-01-22 DIAGNOSIS — H6122 Impacted cerumen, left ear: Secondary | ICD-10-CM | POA: Diagnosis not present

## 2015-01-23 DIAGNOSIS — D225 Melanocytic nevi of trunk: Secondary | ICD-10-CM | POA: Diagnosis not present

## 2015-01-23 DIAGNOSIS — L814 Other melanin hyperpigmentation: Secondary | ICD-10-CM | POA: Diagnosis not present

## 2015-01-23 DIAGNOSIS — Z85828 Personal history of other malignant neoplasm of skin: Secondary | ICD-10-CM | POA: Diagnosis not present

## 2015-01-23 DIAGNOSIS — D2239 Melanocytic nevi of other parts of face: Secondary | ICD-10-CM | POA: Diagnosis not present

## 2015-01-23 DIAGNOSIS — D1801 Hemangioma of skin and subcutaneous tissue: Secondary | ICD-10-CM | POA: Diagnosis not present

## 2015-01-23 DIAGNOSIS — L820 Inflamed seborrheic keratosis: Secondary | ICD-10-CM | POA: Diagnosis not present

## 2015-01-23 DIAGNOSIS — L821 Other seborrheic keratosis: Secondary | ICD-10-CM | POA: Diagnosis not present

## 2015-01-23 DIAGNOSIS — L300 Nummular dermatitis: Secondary | ICD-10-CM | POA: Diagnosis not present

## 2015-03-11 DIAGNOSIS — Z23 Encounter for immunization: Secondary | ICD-10-CM | POA: Diagnosis not present

## 2015-03-11 DIAGNOSIS — I129 Hypertensive chronic kidney disease with stage 1 through stage 4 chronic kidney disease, or unspecified chronic kidney disease: Secondary | ICD-10-CM | POA: Diagnosis not present

## 2015-03-11 DIAGNOSIS — F329 Major depressive disorder, single episode, unspecified: Secondary | ICD-10-CM | POA: Diagnosis not present

## 2015-03-11 DIAGNOSIS — R413 Other amnesia: Secondary | ICD-10-CM | POA: Diagnosis not present

## 2015-03-11 DIAGNOSIS — M1990 Unspecified osteoarthritis, unspecified site: Secondary | ICD-10-CM | POA: Diagnosis not present

## 2015-03-11 DIAGNOSIS — K219 Gastro-esophageal reflux disease without esophagitis: Secondary | ICD-10-CM | POA: Diagnosis not present

## 2015-03-11 DIAGNOSIS — N183 Chronic kidney disease, stage 3 (moderate): Secondary | ICD-10-CM | POA: Diagnosis not present

## 2015-03-11 DIAGNOSIS — E785 Hyperlipidemia, unspecified: Secondary | ICD-10-CM | POA: Diagnosis not present

## 2015-03-11 DIAGNOSIS — I10 Essential (primary) hypertension: Secondary | ICD-10-CM | POA: Diagnosis not present

## 2015-03-11 DIAGNOSIS — Z6826 Body mass index (BMI) 26.0-26.9, adult: Secondary | ICD-10-CM | POA: Diagnosis not present

## 2015-05-25 ENCOUNTER — Encounter (HOSPITAL_COMMUNITY): Payer: Self-pay

## 2015-05-25 ENCOUNTER — Emergency Department (HOSPITAL_COMMUNITY)
Admission: EM | Admit: 2015-05-25 | Discharge: 2015-05-25 | Disposition: A | Discharge status: Home / Self Care | Payer: Medicare Other | Attending: Emergency Medicine | Admitting: Emergency Medicine

## 2015-05-25 DIAGNOSIS — R110 Nausea: Secondary | ICD-10-CM | POA: Insufficient documentation

## 2015-05-25 DIAGNOSIS — G8929 Other chronic pain: Secondary | ICD-10-CM | POA: Insufficient documentation

## 2015-05-25 DIAGNOSIS — I10 Essential (primary) hypertension: Secondary | ICD-10-CM | POA: Insufficient documentation

## 2015-05-25 DIAGNOSIS — D649 Anemia, unspecified: Secondary | ICD-10-CM | POA: Insufficient documentation

## 2015-05-25 DIAGNOSIS — Z86010 Personal history of colonic polyps: Secondary | ICD-10-CM | POA: Diagnosis not present

## 2015-05-25 DIAGNOSIS — M810 Age-related osteoporosis without current pathological fracture: Secondary | ICD-10-CM | POA: Insufficient documentation

## 2015-05-25 DIAGNOSIS — G4700 Insomnia, unspecified: Secondary | ICD-10-CM | POA: Insufficient documentation

## 2015-05-25 DIAGNOSIS — M1990 Unspecified osteoarthritis, unspecified site: Secondary | ICD-10-CM | POA: Diagnosis not present

## 2015-05-25 DIAGNOSIS — F329 Major depressive disorder, single episode, unspecified: Secondary | ICD-10-CM | POA: Insufficient documentation

## 2015-05-25 DIAGNOSIS — R112 Nausea with vomiting, unspecified: Secondary | ICD-10-CM | POA: Diagnosis not present

## 2015-05-25 DIAGNOSIS — Z7982 Long term (current) use of aspirin: Secondary | ICD-10-CM | POA: Insufficient documentation

## 2015-05-25 DIAGNOSIS — Z96643 Presence of artificial hip joint, bilateral: Secondary | ICD-10-CM | POA: Insufficient documentation

## 2015-05-25 DIAGNOSIS — Z8709 Personal history of other diseases of the respiratory system: Secondary | ICD-10-CM | POA: Insufficient documentation

## 2015-05-25 DIAGNOSIS — I480 Paroxysmal atrial fibrillation: Secondary | ICD-10-CM | POA: Insufficient documentation

## 2015-05-25 DIAGNOSIS — Z79899 Other long term (current) drug therapy: Secondary | ICD-10-CM | POA: Insufficient documentation

## 2015-05-25 DIAGNOSIS — R42 Dizziness and giddiness: Secondary | ICD-10-CM | POA: Diagnosis present

## 2015-05-25 DIAGNOSIS — R404 Transient alteration of awareness: Secondary | ICD-10-CM | POA: Diagnosis not present

## 2015-05-25 DIAGNOSIS — R531 Weakness: Secondary | ICD-10-CM | POA: Diagnosis not present

## 2015-05-25 DIAGNOSIS — K219 Gastro-esophageal reflux disease without esophagitis: Secondary | ICD-10-CM | POA: Insufficient documentation

## 2015-05-25 DIAGNOSIS — H409 Unspecified glaucoma: Secondary | ICD-10-CM | POA: Diagnosis not present

## 2015-05-25 DIAGNOSIS — Z87828 Personal history of other (healed) physical injury and trauma: Secondary | ICD-10-CM | POA: Insufficient documentation

## 2015-05-25 LAB — BASIC METABOLIC PANEL WITH GFR
Anion gap: 7 (ref 5–15)
BUN: 22 mg/dL — ABNORMAL HIGH (ref 6–20)
CO2: 25 mmol/L (ref 22–32)
Calcium: 8.3 mg/dL — ABNORMAL LOW (ref 8.9–10.3)
Chloride: 106 mmol/L (ref 101–111)
Creatinine, Ser: 1.23 mg/dL — ABNORMAL HIGH (ref 0.44–1.00)
GFR calc Af Amer: 45 mL/min — ABNORMAL LOW (ref >60)
GFR calc non Af Amer: 39 mL/min — ABNORMAL LOW (ref >60)
Glucose, Bld: 115 mg/dL — ABNORMAL HIGH (ref 65–99)
Potassium: 3.9 mmol/L (ref 3.5–5.1)
Sodium: 138 mmol/L (ref 135–145)

## 2015-05-25 LAB — HEPATIC FUNCTION PANEL
ALT: 13 U/L — ABNORMAL LOW (ref 14–54)
AST: 16 U/L (ref 15–41)
Albumin: 3.2 g/dL — ABNORMAL LOW (ref 3.5–5.0)
Alkaline Phosphatase: 90 U/L (ref 38–126)
Bilirubin, Direct: 0.1 mg/dL (ref 0.1–0.5)
Indirect Bilirubin: 0.5 mg/dL (ref 0.3–0.9)
Total Bilirubin: 0.6 mg/dL (ref 0.3–1.2)
Total Protein: 5.9 g/dL — ABNORMAL LOW (ref 6.5–8.1)

## 2015-05-25 LAB — CBC WITH DIFFERENTIAL/PLATELET
Basophils Absolute: 0 K/uL (ref 0.0–0.1)
Basophils Relative: 0 %
Eosinophils Absolute: 0 K/uL (ref 0.0–0.7)
Eosinophils Relative: 1 %
HCT: 39.2 % (ref 36.0–46.0)
Hemoglobin: 12.4 g/dL (ref 12.0–15.0)
Lymphocytes Relative: 15 %
Lymphs Abs: 1 K/uL (ref 0.7–4.0)
MCH: 29.7 pg (ref 26.0–34.0)
MCHC: 31.6 g/dL (ref 30.0–36.0)
MCV: 94 fL (ref 78.0–100.0)
Monocytes Absolute: 0.5 K/uL (ref 0.1–1.0)
Monocytes Relative: 8 %
Neutro Abs: 5.1 K/uL (ref 1.7–7.7)
Neutrophils Relative %: 76 %
Platelets: 191 K/uL (ref 150–400)
RBC: 4.17 MIL/uL (ref 3.87–5.11)
RDW: 13.2 % (ref 11.5–15.5)
WBC: 6.6 K/uL (ref 4.0–10.5)

## 2015-05-25 LAB — TROPONIN I: Troponin I: <0.03 ng/mL (ref <0.031)

## 2015-05-25 LAB — LIPASE, BLOOD: Lipase: 24 U/L (ref 11–51)

## 2015-05-25 LAB — BRAIN NATRIURETIC PEPTIDE: B Natriuretic Peptide: 252.7 pg/mL — ABNORMAL HIGH (ref 0.0–100.0)

## 2015-05-25 MED ORDER — ONDANSETRON HCL 4 MG/2ML IJ SOLN
4.0000 mg | Freq: Once | INTRAMUSCULAR | Status: AC
  Administered 2015-05-25: 4 mg via INTRAVENOUS
  Filled 2015-05-25: qty 2

## 2015-05-25 MED ORDER — SODIUM CHLORIDE 0.9 % IV BOLUS (SEPSIS)
1000.0000 mL | Freq: Once | INTRAVENOUS | Status: AC
  Administered 2015-05-25: 1000 mL via INTRAVENOUS

## 2015-05-25 MED ORDER — ASPIRIN 325 MG PO TABS
325.0000 mg | ORAL_TABLET | Freq: Once | ORAL | Status: AC
  Administered 2015-05-25: 325 mg via ORAL
  Filled 2015-05-25: qty 1

## 2015-05-25 MED ORDER — ASPIRIN EC 325 MG PO TBEC
325.0000 mg | DELAYED_RELEASE_TABLET | Freq: Every day | ORAL | Status: AC
Start: 2015-05-25 — End: ?

## 2015-05-25 NOTE — ED Notes (Signed)
Doctor at bedside.

## 2015-05-25 NOTE — ED Notes (Signed)
Per EMS - pt was sick after lunch Sunday. Pt reports n/v since then. Denies abd pain. Pt has not been able to eat since Sunday afternoon. 4mg  zofran w/o relief. 350ml NS. 12-lead ST w/ occ PAC. Denies CP, syncope, etc.

## 2015-05-25 NOTE — ED Provider Notes (Signed)
CSN: [id number]     Arrival date & time 05/25/15  [id number] History   First MD Initiated Contact with Patient 05/25/15 [phone]     Chief Complaint  Patient presents with  . Emesis    HPI  Ms. Martha Hendrix is a 81 year old female with PMH of HTN, GERD, OA s/p left total hip replacement (10/2013), Esophageal stricture, and dysphagia who presents from [facility] with complaints of nausea. Her symptoms began Saturday evening after she ate crab cakes which did not sit well. She says soon after eating she began to have nausea and vomiting. Her vomiting resolved but she continued to have nausea as well as lightheadedness/dizziness with sensation of room spinning. She has not eaten anything since Saturday night and only had sips of ginger ale. She admits that she does not usually drink much fluid/water. She has been able to walk to the bathroom without issue, but otherwise has stayed in bed and hesitant to walk further due to her lightheadedness. She denies any syncope or falls. Before this episode, she is able to walk on her own without problems. She denies chest pain, palpitations, difficulty breathing, diarrhea, constipation, urinary symptoms, or abdominal pain. She denies smoking, reports occasional glass of wine, denies recreational drug use. She has not taken her regular medications today, and is unsure if she took them yesterday.   Past Medical History  Diagnosis Date  . Hypertension     takes Diovan and Bystolic daily  . GERD (gastroesophageal reflux disease)     takes Protonix daily  . PONV (postoperative nausea and vomiting)   . History of bronchitis     last time unknown  . Headache(784.0)     occasionally  . Joint pain   . Joint swelling   . Chronic back pain     stenosis  . Arthritis     osteo, takes naprosyn daily  . Bruises easily     on both legs  . History of colon polyps   . Nocturia   . Anemia     as a child and never had a transfusion  . Cataract   .  Glaucoma     uses eye drops  . Depression     takes Lexapro daily  . Insomnia     takes Ambien nightly  . Osteoarthritis of left hip 2015    Status post left hip replacement 10/20/13  . Esophageal stricture   . Dysphagia   . Adenomatous colon polyp   . Congenital spondylolysis, lumbosacral region   . Spinal stenosis of lumbar region   . Senile osteoporosis   . Acute blood loss anemia    Past Surgical History  Procedure Laterality Date  . Left arm surgery      d/t break as child  . Appendectomy      as a child  . Joint replacement Right 2001    right  . Total hip arthroplasty Right   . Cataract surgery Left 2014  . Colonoscopy    . Esophagogastroduodenoscopy      with ED  . Epidural block injection    . Lumbar laminectomy/decompression microdiscectomy Left 07/19/2013    Procedure: LUMBAR LAMINECTOMY/DECOMPRESSION MICRODISCECTOMY LUMBAR THREE-FOUR,LUMBAR FOUR-FIVE;  Surgeon: Eustace Moore, MD;  Location: [facility];  Service: Neurosurgery;  Laterality: Left;  . Total hip arthroplasty Left 10/22/2013    Procedure: LEFT TOTAL HIP ARTHROPLASTY ANTERIOR APPROACH;  Surgeon: Mauri Pole, MD;  Location: [facility];  Service: Orthopedics;  Laterality: Left;   History reviewed. No pertinent family history. Social History  Substance Use Topics  . Smoking status: Never Smoker   . Smokeless tobacco: Never Used  . Alcohol Use: Yes     Comment: glass of wine every 3-79months   OB History    No data available     Review of Systems  Constitutional: Negative for fever, chills and diaphoresis.  Eyes: Negative for visual disturbance.  Respiratory: Negative for shortness of breath.   Cardiovascular: Negative for chest pain and palpitations.  Gastrointestinal: Positive for nausea. Negative for vomiting, diarrhea, constipation and blood in stool.  Genitourinary: Negative for dysuria and hematuria.  Neurological: Positive for dizziness and light-headedness. Negative for syncope.       Allergies  Codeine; Hydrocodone; and Celebrex  Home Medications   Prior to Admission medications   Medication Sig Start Date End Date Taking? Authorizing Provider  acetaminophen (TYLENOL) 500 MG tablet Take 1,000 mg by mouth 2 (two) times daily as needed (pain).   Yes Historical Provider, MD  Calcium-Vitamin D (CALTRATE 600 PLUS-VIT D PO) Take 1 tablet by mouth daily.   Yes Historical Provider, MD  escitalopram (LEXAPRO) 10 MG tablet Take 10 mg by mouth every morning.    Yes Historical Provider, MD  latanoprost (XALATAN) 0.005 % ophthalmic solution Place 1 drop into both eyes at bedtime.   Yes Historical Provider, MD  Multiple Vitamin (MULTIVITAMIN WITH MINERALS) TABS tablet Take 1 tablet by mouth daily.    Yes Historical Provider, MD  nebivolol (BYSTOLIC) 10 MG tablet Take 10 mg by mouth every morning.    Yes Historical Provider, MD  pantoprazole (PROTONIX) 40 MG tablet Take 40 mg by mouth daily.   Yes Historical Provider, MD  traMADol (ULTRAM) 50 MG tablet Take 50 mg by mouth every 6 (six) hours as needed for moderate pain.  10/25/13  Yes Claudette T Levie Heritage, NP  valsartan-hydrochlorothiazide (DIOVAN-HCT) 160-12.5 MG per tablet Take 1 tablet by mouth every morning.    Yes Historical Provider, MD  zolpidem (AMBIEN) 10 MG tablet Take 10 mg by mouth at bedtime as needed for sleep.   Yes Historical Provider, MD  acetaminophen (TYLENOL) 325 MG tablet Take 650 mg by mouth 3 (three) times daily.    Mardene Celeste, NP  aspirin EC 325 MG tablet Take 1 tablet (325 mg total) by mouth daily. 05/25/15   Zada Finders, MD  docusate sodium 100 MG CAPS Take 100 mg by mouth 2 (two) times daily. Patient not taking: Reported on 05/25/2015 10/25/13   Danae Orleans, PA-C  feeding supplement, ENSURE COMPLETE, (ENSURE COMPLETE) LIQD Take 237 mLs by mouth 2 (two) times daily between meals. Patient not taking: Reported on 05/25/2015 10/25/13   Danae Orleans, PA-C  ferrous sulfate 325 (65 FE) MG tablet Take  1 tablet (325 mg total) by mouth 2 (two) times daily with a meal. Patient not taking: Reported on 05/25/2015 10/25/13   Danae Orleans, PA-C  oxyCODONE-acetaminophen (PERCOCET/ROXICET) 5-325 MG per tablet  09/16/13   Historical Provider, MD  polyethylene glycol (MIRALAX / GLYCOLAX) packet Take 17 g by mouth 2 (two) times daily. Patient not taking: Reported on 05/25/2015 10/25/13   Danae Orleans, PA-C  tiZANidine (ZANAFLEX) 4 MG capsule Take 1 capsule (4 mg total) by mouth 3 (three) times daily as needed for muscle spasms. Patient not taking: Reported on 05/25/2015 10/25/13   Danae Orleans, PA-C   BP 141/89 mmHg  Pulse 90  Temp(Src) 98.3 F (36.8 C) (Oral)  Resp 20  SpO2 95% Physical Exam  Constitutional: She is oriented to person, place, and time. She appears well-developed and well-nourished. No distress.  HENT:  Head: Normocephalic and atraumatic.  Mouth/Throat: Oropharynx is clear and moist.  Eyes: EOM are normal. Pupils are equal, round, and reactive to light.  Neck: Neck supple.  Cardiovascular: An irregularly irregular rhythm present. Tachycardia present.   Pulmonary/Chest: Effort normal. No respiratory distress. She has no wheezes. She has no rales.  Abdominal: Soft. Bowel sounds are normal. There is no tenderness.  Musculoskeletal: She exhibits no edema or tenderness.  Lymphadenopathy:    She has no cervical adenopathy.  Neurological: She is alert and oriented to person, place, and time.  Skin: Skin is warm and dry.    ED Course  Procedures (including critical care time) Labs Review Labs Reviewed  BASIC METABOLIC PANEL - Abnormal; Notable for the following:    Glucose, Bld 115 (*)    BUN 22 (*)    Creatinine, Ser 1.23 (*)    Calcium 8.3 (*)    GFR calc non Af Amer 39 (*)    GFR calc Af Amer 45 (*)    All other components within normal limits  HEPATIC FUNCTION PANEL - Abnormal; Notable for the following:    Total Protein 5.9 (*)    Albumin 3.2 (*)    ALT 13 (*)     All other components within normal limits  BRAIN NATRIURETIC PEPTIDE - Abnormal; Notable for the following:    B Natriuretic Peptide 252.7 (*)    All other components within normal limits  CBC WITH DIFFERENTIAL/PLATELET  LIPASE, BLOOD  TROPONIN I    Imaging Review No results found. I have personally reviewed and evaluated these images and lab results as part of my medical decision-making.   EKG Interpretation   Date/Time:  Monday May 25 2015 09:42:00 EST Ventricular Rate:  119 PR Interval:    QRS Duration: 75 QT Interval:  396 QTC Calculation: 557 R Axis:   -19 Text Interpretation:  Atrial fibrillation Borderline left axis deviation  Low voltage, precordial leads Consider anterior infarct Minimal ST  depression, anterior leads Prolonged QT interval Baseline wander in  lead(s) V2 V3 V4 agree Confirmed by Johnney Killian, MD, Jeannie Done [phone]) on  05/25/2015 11:54:16 AM      MDM   Final diagnoses:  Nausea  Paroxysmal atrial fibrillation ([place])  Ms. Martha Hendrix is a 81 year old female with PMH of HTN, GERD, OA s/p left total hip replacement (10/2013), Esophageal stricture, and dysphagia who presents from [facility] with complaints of nausea and lightheadedness/dizziness. Symptoms began after eating crab cakes which did not sit well and was quickly followed with an episode of emesis. She has not eaten anything since and reports little fluid intake. Symptoms likely secondary to hypovolemia but there is concern for underlying abdominal process (pancreatitis, choledocholithiasis, infection), will evaluate with Hepatic function panel, Lipase, CBC, BMP. EKG also showing variable rhythm, possibly new onset Atrial Fibrillation which may explain her symptoms as well. She denies any knowledge of prior heart conditions or arrhythmia. Will evaluate for silent cardiac etiology with Troponin and BNP.  Creatinine returns elevated at 1.23, however appears to be her baseline with  prior values between 1.2-1.3. Likely secondary to dehydration. Lipase and CBC are normal. Troponin returns negative. BNP is elevated at 252.7, no baseline to compare. Repeat EKG was obtained which shows sinus rhythm with PACs.  Patient improved with 1L NS and Zofran. She is  given Aspirin 325 mg in the ED with instructions to continue daily. She will follow up with her PCP, Dr. Burnard Bunting, and advised to follow up with Cardiology outpatient. She is advised on return precautions and to keep hydrated with fluids.   Zada Finders, MD 05/25/15 1538  Charlesetta Shanks, MD 05/28/15 [phone]

## 2015-05-25 NOTE — ED Notes (Signed)
EKG completed given to EDP.  

## 2015-05-25 NOTE — Discharge Instructions (Signed)
Please take Aspirin 325 mg daily until you follow up with Dr. Winona Legato or Cardiology. Please drink plenty of fluids to stay hydrated. You had an irregular heart rhythm when you came to the hospital which resolved on its own. This may have been an irregular heart rhythm called Atrial Fibrillation. Please discuss this with your doctor when you follow up.   Atrial Fibrillation Atrial fibrillation is a type of heartbeat that is irregular or fast (rapid). If you have this condition, your heart keeps quivering in a weird (chaotic) way. This condition can make it so your heart cannot pump blood normally. Having this condition gives a person more risk for stroke, heart failure, and other heart problems. There are different types of atrial fibrillation. Talk with your doctor to learn about the type that you have. HOME CARE  Take over-the-counter and prescription medicines only as told by your doctor.  If your doctor prescribed a blood-thinning medicine, take it exactly as told. Taking too much of it can cause bleeding. If you do not take enough of it, you will not have the protection that you need against stroke and other problems.  Do not use any tobacco products. These include cigarettes, chewing tobacco, and e-cigarettes. If you need help quitting, ask your doctor.  If you have apnea (obstructive sleep apnea), manage it as told by your doctor.  Do not drink alcohol.  Do not drink beverages that have caffeine. These include coffee, soda, and tea.  Maintain a healthy weight. Do not use diet pills unless your doctor says they are safe for you. Diet pills may make heart problems worse.  Follow diet instructions as told by your doctor.  Exercise regularly as told by your doctor.  Keep all follow-up visits as told by your doctor. This is important. GET HELP IF:  You notice a change in the speed, rhythm, or strength of your heartbeat.  You are taking a blood-thinning medicine and you notice more  bruising.  You get tired more easily when you move or exercise. GET HELP RIGHT AWAY IF:  You have pain in your chest or your belly (abdomen).  You have sweating or weakness.  You feel sick to your stomach (nauseous).  You notice blood in your throw up (vomit), poop (stool), or pee (urine).  You are short of breath.  You suddenly have swollen feet and ankles.  You feel dizzy.  Your suddenly get weak or numb in your face, arms, or legs, especially if it happens on one side of your body.  You have trouble talking, trouble understanding, or both.  Your face or your eyelid droops on one side. These symptoms may be an emergency. Do not wait to see if the symptoms will go away. Get medical help right away. Call your local emergency services (911 in the [place] Do not drive yourself to the hospital.   This information is not intended to replace advice given to you by your health care provider. Make sure you discuss any questions you have with your health care provider.   Document Released: 03/08/2008 Document Revised: 02/18/2015 Document Reviewed: 09/24/2014 Elsevier Interactive Patient Education [employer].

## 2015-05-26 DIAGNOSIS — I129 Hypertensive chronic kidney disease with stage 1 through stage 4 chronic kidney disease, or unspecified chronic kidney disease: Secondary | ICD-10-CM | POA: Diagnosis not present

## 2015-05-26 DIAGNOSIS — Z6826 Body mass index (BMI) 26.0-26.9, adult: Secondary | ICD-10-CM | POA: Diagnosis not present

## 2015-05-26 DIAGNOSIS — I480 Paroxysmal atrial fibrillation: Secondary | ICD-10-CM | POA: Diagnosis not present

## 2015-05-26 DIAGNOSIS — N183 Chronic kidney disease, stage 3 (moderate): Secondary | ICD-10-CM | POA: Diagnosis not present

## 2015-05-27 IMAGING — CR DG HIP 1V PORT*L*
1 series · 1 of 1 positions shown · non-contrast
Comparison: MR HIP*L* W/O CM dated 10/04/2013

CLINICAL DATA: Postoperative radiographs following left total hip
arthroplasty.

EXAM:
PORTABLE PELVIS 1-2 VIEWS; PORTABLE LEFT HIP - 1 VIEW

[AP]
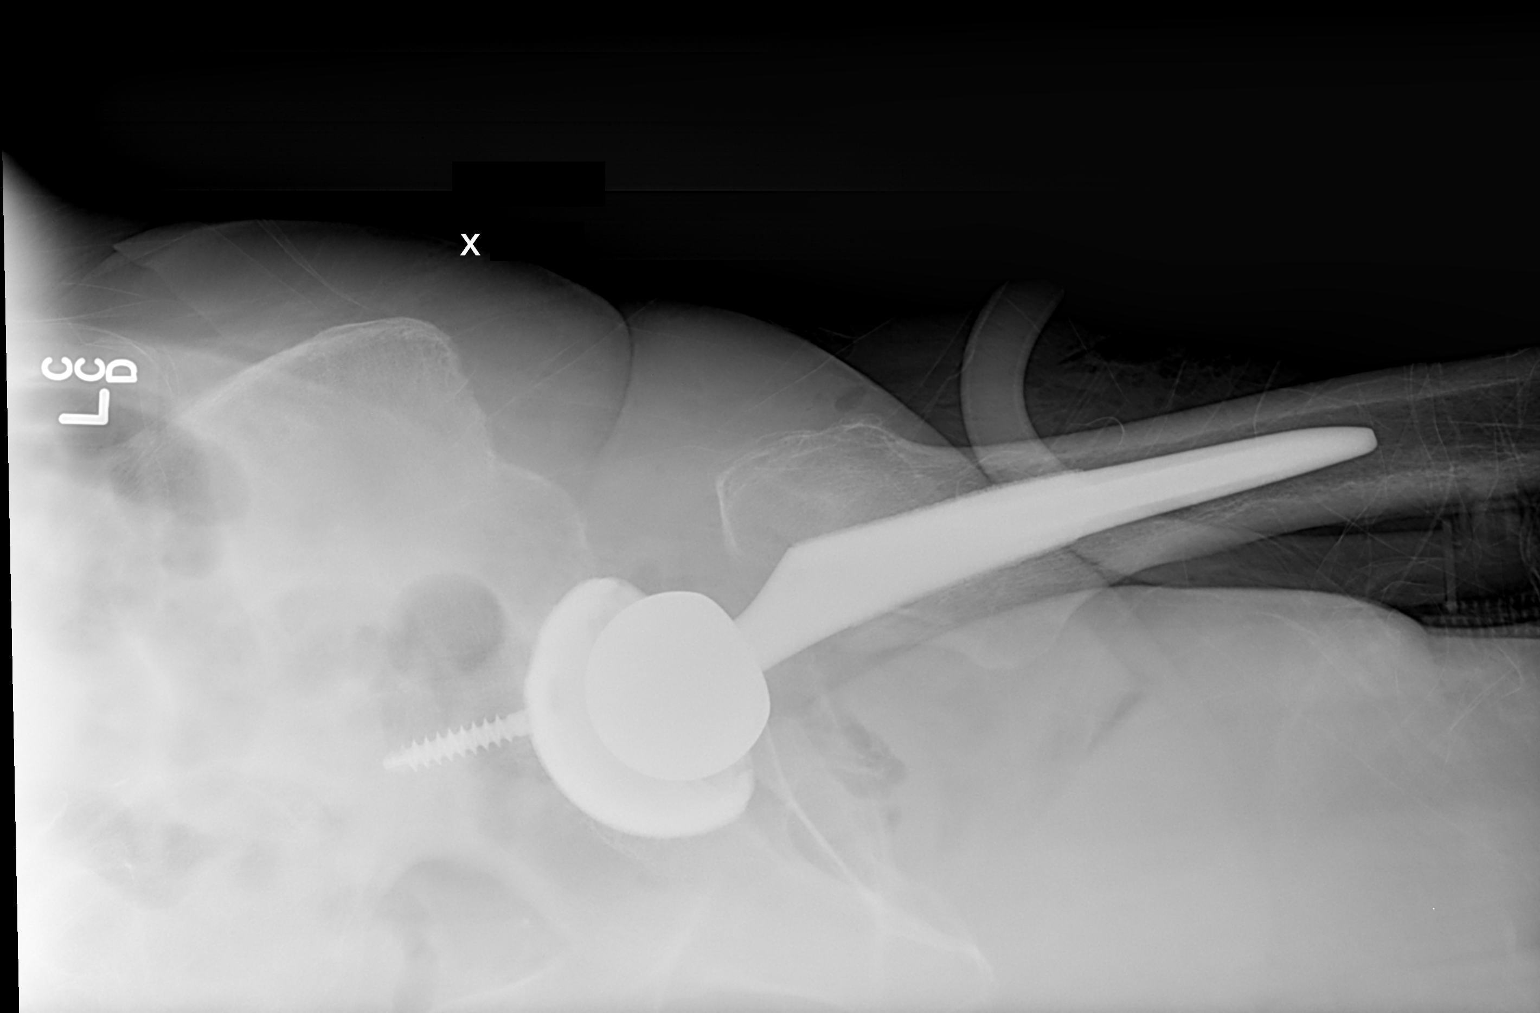

[1 of 1 positions shown; findings below may reference images not displayed]

FINDINGS: New uncomplicated left total hip arthroplasty with expected
postsurgical changes in the soft tissues. Right total hip
arthroplasty also noted. In conjunction with the lateral view, the
hip is located. Femoral stem visualized and normal.
IMPRESSION: Uncomplicated new left total hip arthroplasty.

## 2015-05-27 IMAGING — CR DG PORTABLE PELVIS
1 series · 1 of 1 positions shown · non-contrast
Comparison: MR HIP*L* W/O CM dated 10/04/2013

CLINICAL DATA: Postoperative radiographs following left total hip
arthroplasty.

EXAM:
PORTABLE PELVIS 1-2 VIEWS; PORTABLE LEFT HIP - 1 VIEW

[AP]
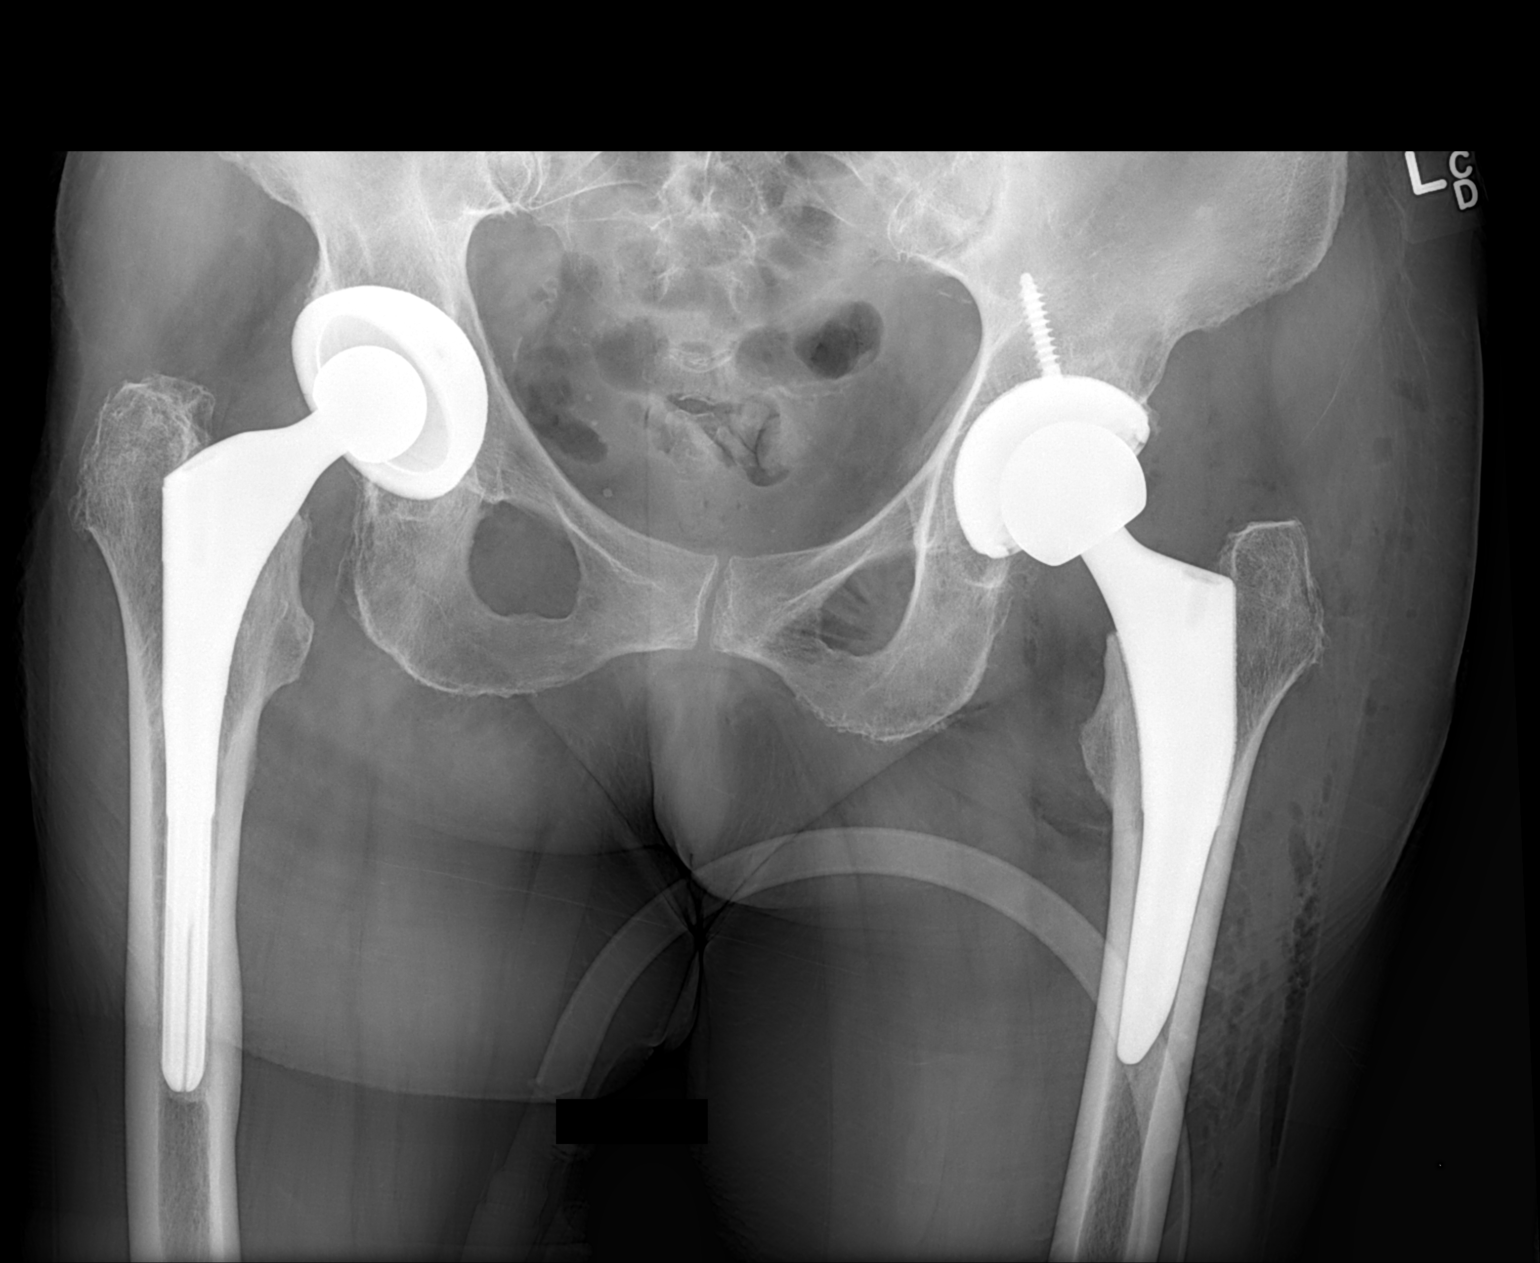

[1 of 1 positions shown; findings below may reference images not displayed]

FINDINGS: New uncomplicated left total hip arthroplasty with expected
postsurgical changes in the soft tissues. Right total hip
arthroplasty also noted. In conjunction with the lateral view, the
hip is located. Femoral stem visualized and normal.
IMPRESSION: Uncomplicated new left total hip arthroplasty.

## 2015-06-01 ENCOUNTER — Telehealth: Payer: Self-pay | Admitting: Cardiovascular Disease

## 2015-06-01 DIAGNOSIS — Z0100 Encounter for examination of eyes and vision without abnormal findings: Secondary | ICD-10-CM | POA: Diagnosis not present

## 2015-06-01 DIAGNOSIS — Z961 Presence of intraocular lens: Secondary | ICD-10-CM | POA: Diagnosis not present

## 2015-06-01 DIAGNOSIS — H04123 Dry eye syndrome of bilateral lacrimal glands: Secondary | ICD-10-CM | POA: Diagnosis not present

## 2015-06-01 DIAGNOSIS — H401131 Primary open-angle glaucoma, bilateral, mild stage: Secondary | ICD-10-CM | POA: Diagnosis not present

## 2015-06-01 NOTE — Telephone Encounter (Signed)
Received records from [facility] for appointment on 06/22/15 with Dr Funny River.  Records given to N Hines (medical records) for Dr Juana Diaz's schedule on 06/22/15.  lp °

## 2015-06-19 DIAGNOSIS — S8992XA Unspecified injury of left lower leg, initial encounter: Secondary | ICD-10-CM | POA: Diagnosis not present

## 2015-06-19 DIAGNOSIS — Z6826 Body mass index (BMI) 26.0-26.9, adult: Secondary | ICD-10-CM | POA: Diagnosis not present

## 2015-06-19 DIAGNOSIS — Z5189 Encounter for other specified aftercare: Secondary | ICD-10-CM | POA: Diagnosis not present

## 2015-06-19 DIAGNOSIS — I480 Paroxysmal atrial fibrillation: Secondary | ICD-10-CM | POA: Diagnosis not present

## 2015-06-22 ENCOUNTER — Encounter: Payer: Medicare Other | Admitting: Cardiovascular Disease

## 2015-06-22 NOTE — Progress Notes (Signed)
This encounter was created in error - please disregard.

## 2015-06-26 DIAGNOSIS — M1990 Unspecified osteoarthritis, unspecified site: Secondary | ICD-10-CM | POA: Diagnosis not present

## 2015-06-26 DIAGNOSIS — Z1389 Encounter for screening for other disorder: Secondary | ICD-10-CM | POA: Diagnosis not present

## 2015-06-26 DIAGNOSIS — I480 Paroxysmal atrial fibrillation: Secondary | ICD-10-CM | POA: Diagnosis not present

## 2015-06-26 DIAGNOSIS — R413 Other amnesia: Secondary | ICD-10-CM | POA: Diagnosis not present

## 2015-06-26 DIAGNOSIS — I10 Essential (primary) hypertension: Secondary | ICD-10-CM | POA: Diagnosis not present

## 2015-06-26 DIAGNOSIS — I872 Venous insufficiency (chronic) (peripheral): Secondary | ICD-10-CM | POA: Diagnosis not present

## 2015-06-26 DIAGNOSIS — N183 Chronic kidney disease, stage 3 (moderate): Secondary | ICD-10-CM | POA: Diagnosis not present

## 2015-06-26 DIAGNOSIS — Z6826 Body mass index (BMI) 26.0-26.9, adult: Secondary | ICD-10-CM | POA: Diagnosis not present

## 2015-06-26 DIAGNOSIS — I129 Hypertensive chronic kidney disease with stage 1 through stage 4 chronic kidney disease, or unspecified chronic kidney disease: Secondary | ICD-10-CM | POA: Diagnosis not present

## 2015-06-26 DIAGNOSIS — K219 Gastro-esophageal reflux disease without esophagitis: Secondary | ICD-10-CM | POA: Diagnosis not present

## 2015-06-26 DIAGNOSIS — E784 Other hyperlipidemia: Secondary | ICD-10-CM | POA: Diagnosis not present

## 2015-06-26 DIAGNOSIS — F329 Major depressive disorder, single episode, unspecified: Secondary | ICD-10-CM | POA: Diagnosis not present

## 2015-07-23 ENCOUNTER — Ambulatory Visit: Payer: Medicare Other | Admitting: Cardiovascular Disease

## 2015-12-11 DIAGNOSIS — H401122 Primary open-angle glaucoma, left eye, moderate stage: Secondary | ICD-10-CM | POA: Diagnosis not present

## 2015-12-11 DIAGNOSIS — H401112 Primary open-angle glaucoma, right eye, moderate stage: Secondary | ICD-10-CM | POA: Diagnosis not present

## 2015-12-11 DIAGNOSIS — H25811 Combined forms of age-related cataract, right eye: Secondary | ICD-10-CM | POA: Diagnosis not present

## 2015-12-11 DIAGNOSIS — H04123 Dry eye syndrome of bilateral lacrimal glands: Secondary | ICD-10-CM | POA: Diagnosis not present

## 2015-12-18 DIAGNOSIS — I10 Essential (primary) hypertension: Secondary | ICD-10-CM | POA: Diagnosis not present

## 2015-12-18 DIAGNOSIS — R8290 Unspecified abnormal findings in urine: Secondary | ICD-10-CM | POA: Diagnosis not present

## 2015-12-18 DIAGNOSIS — N390 Urinary tract infection, site not specified: Secondary | ICD-10-CM | POA: Diagnosis not present

## 2015-12-18 DIAGNOSIS — E784 Other hyperlipidemia: Secondary | ICD-10-CM | POA: Diagnosis not present

## 2015-12-24 DIAGNOSIS — N183 Chronic kidney disease, stage 3 (moderate): Secondary | ICD-10-CM | POA: Diagnosis not present

## 2015-12-24 DIAGNOSIS — I129 Hypertensive chronic kidney disease with stage 1 through stage 4 chronic kidney disease, or unspecified chronic kidney disease: Secondary | ICD-10-CM | POA: Diagnosis not present

## 2015-12-24 DIAGNOSIS — I480 Paroxysmal atrial fibrillation: Secondary | ICD-10-CM | POA: Diagnosis not present

## 2015-12-24 DIAGNOSIS — Q762 Congenital spondylolisthesis: Secondary | ICD-10-CM | POA: Diagnosis not present

## 2015-12-24 DIAGNOSIS — I10 Essential (primary) hypertension: Secondary | ICD-10-CM | POA: Diagnosis not present

## 2015-12-24 DIAGNOSIS — I872 Venous insufficiency (chronic) (peripheral): Secondary | ICD-10-CM | POA: Diagnosis not present

## 2015-12-24 DIAGNOSIS — R413 Other amnesia: Secondary | ICD-10-CM | POA: Diagnosis not present

## 2015-12-24 DIAGNOSIS — F329 Major depressive disorder, single episode, unspecified: Secondary | ICD-10-CM | POA: Diagnosis not present

## 2015-12-24 DIAGNOSIS — Z6830 Body mass index (BMI) 30.0-30.9, adult: Secondary | ICD-10-CM | POA: Diagnosis not present

## 2015-12-24 DIAGNOSIS — M1990 Unspecified osteoarthritis, unspecified site: Secondary | ICD-10-CM | POA: Diagnosis not present

## 2015-12-24 DIAGNOSIS — E784 Other hyperlipidemia: Secondary | ICD-10-CM | POA: Diagnosis not present

## 2015-12-24 DIAGNOSIS — Z0000 Encounter for general adult medical examination without abnormal findings: Secondary | ICD-10-CM | POA: Diagnosis not present

## 2016-01-13 DIAGNOSIS — H401122 Primary open-angle glaucoma, left eye, moderate stage: Secondary | ICD-10-CM | POA: Diagnosis not present

## 2016-01-13 DIAGNOSIS — H401112 Primary open-angle glaucoma, right eye, moderate stage: Secondary | ICD-10-CM | POA: Diagnosis not present

## 2016-01-13 DIAGNOSIS — H25811 Combined forms of age-related cataract, right eye: Secondary | ICD-10-CM | POA: Diagnosis not present

## 2016-01-13 DIAGNOSIS — H04123 Dry eye syndrome of bilateral lacrimal glands: Secondary | ICD-10-CM | POA: Diagnosis not present

## 2016-07-11 DIAGNOSIS — E784 Other hyperlipidemia: Secondary | ICD-10-CM | POA: Diagnosis not present

## 2016-07-11 DIAGNOSIS — I129 Hypertensive chronic kidney disease with stage 1 through stage 4 chronic kidney disease, or unspecified chronic kidney disease: Secondary | ICD-10-CM | POA: Diagnosis not present

## 2016-07-11 DIAGNOSIS — M5489 Other dorsalgia: Secondary | ICD-10-CM | POA: Diagnosis not present

## 2016-07-11 DIAGNOSIS — N183 Chronic kidney disease, stage 3 (moderate): Secondary | ICD-10-CM | POA: Diagnosis not present

## 2016-07-11 DIAGNOSIS — I480 Paroxysmal atrial fibrillation: Secondary | ICD-10-CM | POA: Diagnosis not present

## 2016-07-11 DIAGNOSIS — M1990 Unspecified osteoarthritis, unspecified site: Secondary | ICD-10-CM | POA: Diagnosis not present

## 2016-07-11 DIAGNOSIS — Z1389 Encounter for screening for other disorder: Secondary | ICD-10-CM | POA: Diagnosis not present

## 2016-07-11 DIAGNOSIS — Q762 Congenital spondylolisthesis: Secondary | ICD-10-CM | POA: Diagnosis not present

## 2016-07-11 DIAGNOSIS — R5381 Other malaise: Secondary | ICD-10-CM | POA: Diagnosis not present

## 2016-07-11 DIAGNOSIS — R413 Other amnesia: Secondary | ICD-10-CM | POA: Diagnosis not present

## 2016-07-11 DIAGNOSIS — Z6830 Body mass index (BMI) 30.0-30.9, adult: Secondary | ICD-10-CM | POA: Diagnosis not present

## 2016-07-11 DIAGNOSIS — I872 Venous insufficiency (chronic) (peripheral): Secondary | ICD-10-CM | POA: Diagnosis not present

## 2016-07-29 DIAGNOSIS — R509 Fever, unspecified: Secondary | ICD-10-CM | POA: Diagnosis not present

## 2016-07-29 DIAGNOSIS — N390 Urinary tract infection, site not specified: Secondary | ICD-10-CM | POA: Diagnosis not present

## 2016-08-02 ENCOUNTER — Emergency Department (HOSPITAL_COMMUNITY)
Admission: EM | Admit: 2016-08-02 | Discharge: 2016-08-02 | Disposition: A | Discharge status: Home / Self Care | Payer: Medicare Other | Attending: Emergency Medicine | Admitting: Emergency Medicine

## 2016-08-02 ENCOUNTER — Emergency Department (HOSPITAL_COMMUNITY): Payer: Medicare Other

## 2016-08-02 ENCOUNTER — Encounter (HOSPITAL_COMMUNITY): Payer: Self-pay | Admitting: Emergency Medicine

## 2016-08-02 DIAGNOSIS — H6123 Impacted cerumen, bilateral: Secondary | ICD-10-CM | POA: Insufficient documentation

## 2016-08-02 DIAGNOSIS — R0689 Other abnormalities of breathing: Secondary | ICD-10-CM | POA: Insufficient documentation

## 2016-08-02 DIAGNOSIS — R05 Cough: Secondary | ICD-10-CM | POA: Insufficient documentation

## 2016-08-02 DIAGNOSIS — R531 Weakness: Secondary | ICD-10-CM | POA: Diagnosis present

## 2016-08-02 DIAGNOSIS — Z96643 Presence of artificial hip joint, bilateral: Secondary | ICD-10-CM | POA: Diagnosis not present

## 2016-08-02 DIAGNOSIS — E860 Dehydration: Secondary | ICD-10-CM | POA: Insufficient documentation

## 2016-08-02 DIAGNOSIS — Z79899 Other long term (current) drug therapy: Secondary | ICD-10-CM | POA: Diagnosis not present

## 2016-08-02 DIAGNOSIS — R059 Cough, unspecified: Secondary | ICD-10-CM

## 2016-08-02 DIAGNOSIS — R42 Dizziness and giddiness: Secondary | ICD-10-CM | POA: Diagnosis not present

## 2016-08-02 DIAGNOSIS — Z7901 Long term (current) use of anticoagulants: Secondary | ICD-10-CM | POA: Insufficient documentation

## 2016-08-02 DIAGNOSIS — R404 Transient alteration of awareness: Secondary | ICD-10-CM | POA: Diagnosis not present

## 2016-08-02 DIAGNOSIS — I10 Essential (primary) hypertension: Secondary | ICD-10-CM | POA: Insufficient documentation

## 2016-08-02 DIAGNOSIS — Z7982 Long term (current) use of aspirin: Secondary | ICD-10-CM | POA: Diagnosis not present

## 2016-08-02 LAB — CBC WITH DIFFERENTIAL/PLATELET
BASOS ABS: 0 10*3/uL (ref 0.0–0.1)
BASOS PCT: 0 %
EOS ABS: 0.1 10*3/uL (ref 0.0–0.7)
Eosinophils Relative: 2 %
HEMATOCRIT: 42.7 % (ref 36.0–46.0)
Hemoglobin: 14.3 g/dL (ref 12.0–15.0)
Lymphocytes Relative: 29 %
Lymphs Abs: 1.7 10*3/uL (ref 0.7–4.0)
MCH: 30.4 pg (ref 26.0–34.0)
MCHC: 33.5 g/dL (ref 30.0–36.0)
MCV: 90.7 fL (ref 78.0–100.0)
Monocytes Absolute: 0.8 10*3/uL (ref 0.1–1.0)
Monocytes Relative: 13 %
NEUTROS ABS: 3.3 10*3/uL (ref 1.7–7.7)
NEUTROS PCT: 56 %
Platelets: 209 10*3/uL (ref 150–400)
RBC: 4.71 MIL/uL (ref 3.87–5.11)
RDW: 12.8 % (ref 11.5–15.5)
WBC: 5.8 10*3/uL (ref 4.0–10.5)

## 2016-08-02 LAB — COMPREHENSIVE METABOLIC PANEL
ALK PHOS: 109 U/L (ref 38–126)
ALT: 17 U/L (ref 14–54)
ANION GAP: 7 (ref 5–15)
AST: 25 U/L (ref 15–41)
Albumin: 3.8 g/dL (ref 3.5–5.0)
BILIRUBIN TOTAL: 0.7 mg/dL (ref 0.3–1.2)
BUN: 32 mg/dL — ABNORMAL HIGH (ref 6–20)
CALCIUM: 8.7 mg/dL — AB (ref 8.9–10.3)
CO2: 27 mmol/L (ref 22–32)
CREATININE: 1.67 mg/dL — AB (ref 0.44–1.00)
Chloride: 101 mmol/L (ref 101–111)
GFR, EST AFRICAN AMERICAN: 31 mL/min — AB (ref >60)
GFR, EST NON AFRICAN AMERICAN: 26 mL/min — AB (ref >60)
Glucose, Bld: 117 mg/dL — ABNORMAL HIGH (ref 65–99)
Potassium: 4 mmol/L (ref 3.5–5.1)
Sodium: 135 mmol/L (ref 135–145)
TOTAL PROTEIN: 6.8 g/dL (ref 6.5–8.1)

## 2016-08-02 LAB — BRAIN NATRIURETIC PEPTIDE: B NATRIURETIC PEPTIDE 5: 187.8 pg/mL — AB (ref 0.0–100.0)

## 2016-08-02 LAB — URINALYSIS, ROUTINE W REFLEX MICROSCOPIC
BILIRUBIN URINE: NEGATIVE
Glucose, UA: NEGATIVE mg/dL
HGB URINE DIPSTICK: NEGATIVE
Ketones, ur: NEGATIVE mg/dL
Leukocytes, UA: NEGATIVE
Nitrite: NEGATIVE
PROTEIN: NEGATIVE mg/dL
Specific Gravity, Urine: 1.011 (ref 1.005–1.030)
pH: 5 (ref 5.0–8.0)

## 2016-08-02 MED ORDER — SODIUM CHLORIDE 0.9 % IV BOLUS (SEPSIS)
1000.0000 mL | Freq: Once | INTRAVENOUS | Status: AC
  Administered 2016-08-02: 1000 mL via INTRAVENOUS

## 2016-08-02 NOTE — Discharge Instructions (Addendum)
Push fluids to stay hydrated.  Urine sample shows no infection. Chest x-ray does not show pneumonia.   You can stop taking Levaquin.

## 2016-08-02 NOTE — ED Triage Notes (Addendum)
Per [facility] pt from home independent living at [facility] , recent DX UTI , on Levaquin x 5 days. Co weakness and dizziness , no fever. Alert and oriented x 4. Normally uses walker . Possible dehydration per Family. No urinary symptoms

## 2016-08-02 NOTE — ED Notes (Signed)
Bed: [id number] Expected date:  Expected time:  Means of arrival:  Comments: EMS- 83yo F, recent UTI/dizziness/lethargic

## 2016-08-02 NOTE — ED Provider Notes (Signed)
[facility] DEPT Provider Note   CSN: [id number] Arrival date & time: 08/02/16 1455     History    Chief Complaint  Patient presents with  . Weakness    recent UTI  . Dizziness     HPI Martha Hendrix is a 83 y.o. female.  83yo F w/ PMH below who p/w weakness, dizziness and cough. 4-5 days ago, the patient developed a cough associated w/ generalized weakness and fever up to 102. She was Evaluated at an urgent care 4 days ago and diagnosed with a UTI. She was given a 10 day course of Levaquin which she has been taking but her symptoms of weakness, dizziness, not feeling well have worsened. She denies any dysuria or hematuria, abdominal pain, chest pain, or shortness of breath. She does endorse decreased energy and decreased appetite as well as mild nausea. She has not had any other fevers since 4 days ago. She does continue to have a persistent dry cough.   Past Medical History:  Diagnosis Date  . Acute blood loss anemia   . Adenomatous colon polyp   . Anemia    as a child and never had a transfusion  . Arthritis    osteo, takes naprosyn daily  . Bruises easily    on both legs  . Cataract   . Chronic back pain    stenosis  . Congenital spondylolysis, lumbosacral region   . Depression    takes Lexapro daily  . Dysphagia   . Esophageal stricture   . GERD (gastroesophageal reflux disease)    takes Protonix daily  . Glaucoma    uses eye drops  . Headache(784.0)    occasionally  . History of bronchitis    last time unknown  . History of colon polyps   . Hypertension    takes Diovan and Bystolic daily  . Insomnia    takes Ambien nightly  . Joint pain   . Joint swelling   . Nocturia   . Osteoarthritis of left hip 2015   Status post left hip replacement 10/20/13  . PONV (postoperative nausea and vomiting)   . Senile osteoporosis   . Spinal stenosis of lumbar region      Patient Active Problem List   Diagnosis Date Noted  . Acute blood loss anemia   .  Senile osteoporosis   . Spinal stenosis of lumbar region   . Osteoarthritis of left hip   . Depression   . Chronic back pain   . Hypertension   . Unspecified constipation 10/28/2013  . UTI (urinary tract infection) 10/25/2013  . Overweight (BMI 25.0-29.9) 10/23/2013  . S/P left THA, AA 10/22/2013  . S/P lumbar laminectomy 07/19/2013  . ESOPHAGEAL STRICTURE 10/27/2008  . GERD 10/27/2008  . DYSPHAGIA 10/27/2008  . PERSONAL HX COLONIC POLYPS 10/27/2008    Past Surgical History:  Procedure Laterality Date  . APPENDECTOMY     as a child  . cataract surgery Left 2014  . COLONOSCOPY    . EPIDURAL BLOCK INJECTION    . ESOPHAGOGASTRODUODENOSCOPY     with ED  . JOINT REPLACEMENT Right 2001   right  . left arm surgery     d/t break as child  . LUMBAR LAMINECTOMY/DECOMPRESSION MICRODISCECTOMY Left 07/19/2013   Procedure: LUMBAR LAMINECTOMY/DECOMPRESSION MICRODISCECTOMY LUMBAR THREE-FOUR,LUMBAR FOUR-FIVE;  Surgeon: Eustace Moore, MD;  Location: [facility];  Service: Neurosurgery;  Laterality: Left;  . TOTAL HIP ARTHROPLASTY Right   . TOTAL HIP ARTHROPLASTY Left 10/22/2013  Procedure: LEFT TOTAL HIP ARTHROPLASTY ANTERIOR APPROACH;  Surgeon: Mauri Pole, MD;  Location: [facility];  Service: Orthopedics;  Laterality: Left;    OB History    No data available        Home Medications    Prior to Admission medications   Medication Sig Start Date End Date Taking? Authorizing Provider  donepezil (ARICEPT) 5 MG tablet Take 5 mg by mouth at bedtime.   Yes Historical Provider, MD  escitalopram (LEXAPRO) 10 MG tablet Take 10 mg by mouth every morning.    Yes Historical Provider, MD  levofloxacin (LEVAQUIN) 500 MG tablet  07/29/16  Yes Historical Provider, MD  naproxen (NAPROSYN) 500 MG tablet Take 500 mg by mouth 2 (two) times daily with a meal.   Yes Historical Provider, MD  pantoprazole (PROTONIX) 40 MG tablet Take 40 mg by mouth daily.   Yes Historical Provider, MD  Rivaroxaban (XARELTO)  15 MG TABS tablet Take 15 mg by mouth daily.   Yes Historical Provider, MD  valsartan-hydrochlorothiazide (DIOVAN-HCT) 160-12.5 MG per tablet Take 1 tablet by mouth every morning.    Yes Historical Provider, MD  zolpidem (AMBIEN) 10 MG tablet Take 10 mg by mouth at bedtime as needed for sleep.   Yes Historical Provider, MD  aspirin EC 325 MG tablet Take 1 tablet (325 mg total) by mouth daily. 05/25/15   Zada Finders, MD  docusate sodium 100 MG CAPS Take 100 mg by mouth 2 (two) times daily. Patient not taking: Reported on 05/25/2015 10/25/13   Danae Orleans, PA-C  feeding supplement, ENSURE COMPLETE, (ENSURE COMPLETE) LIQD Take 237 mLs by mouth 2 (two) times daily between meals. Patient not taking: Reported on 05/25/2015 10/25/13   Danae Orleans, PA-C  ferrous sulfate 325 (65 FE) MG tablet Take 1 tablet (325 mg total) by mouth 2 (two) times daily with a meal. Patient not taking: Reported on 05/25/2015 10/25/13   Danae Orleans, PA-C  polyethylene glycol (MIRALAX / Floria Raveling) packet Take 17 g by mouth 2 (two) times daily. Patient not taking: Reported on 05/25/2015 10/25/13   Danae Orleans, PA-C  tiZANidine (ZANAFLEX) 4 MG capsule Take 1 capsule (4 mg total) by mouth 3 (three) times daily as needed for muscle spasms. Patient not taking: Reported on 05/25/2015 10/25/13   Danae Orleans, PA-C      No family history on file.   Social History  Substance Use Topics  . Smoking status: Never Smoker  . Smokeless tobacco: Never Used  . Alcohol use Yes     Comment: glass of wine every 3-51months     Allergies     Codeine; Hydrocodone; and Celebrex [celecoxib]    Review of Systems  10 Systems reviewed and are negative for acute change except as noted in the HPI.   Physical Exam Updated Vital Signs BP 142/86 (BP Location: Right Arm)   Pulse 80   Temp 98.2 F (36.8 C) (Oral)   Resp 18   SpO2 95%   Physical Exam  Constitutional: She is oriented to person, place, and time. She appears  well-developed and well-nourished. No distress.  HENT:  Head: Normocephalic and atraumatic.  Moist mucous membranes B/l ear canals occluded by cerumen  Eyes: Conjunctivae are normal. Pupils are equal, round, and reactive to light.  Neck: Neck supple.  Cardiovascular: Normal rate, regular rhythm and normal heart sounds.   No murmur heard. Pulmonary/Chest: Effort normal.  Mildly diminished breath sounds bilateral bases  Abdominal: Soft. Bowel sounds are normal. She exhibits  no distension. There is no tenderness.  Musculoskeletal: She exhibits no edema.  Neurological: She is alert and oriented to person, place, and time.  Fluent speech  Skin: Skin is warm and dry.  Psychiatric: She has a normal mood and affect. Judgment normal.  Nursing note and vitals reviewed.     ED Treatments / Results  Labs (all labs ordered are listed, but only abnormal results are displayed) Labs Reviewed  COMPREHENSIVE METABOLIC PANEL - Abnormal; Notable for the following:       Result Value   Glucose, Bld 117 (*)    BUN 32 (*)    Creatinine, Ser 1.67 (*)    Calcium 8.7 (*)    GFR calc non Af Amer 26 (*)    GFR calc Af Amer 31 (*)    All other components within normal limits  BRAIN NATRIURETIC PEPTIDE - Abnormal; Notable for the following:    B Natriuretic Peptide 187.8 (*)    All other components within normal limits  URINE CULTURE  CBC WITH DIFFERENTIAL/PLATELET  URINALYSIS, ROUTINE W REFLEX MICROSCOPIC     EKG  EKG Interpretation  Date/Time:  Tuesday August 02 2016 15:39:35 EST Ventricular Rate:  78 PR Interval:    QRS Duration: 93 QT Interval:  416 QTC Calculation: 474 R Axis:   -34 Text Interpretation:  Atrial fibrillation Left axis deviation Low voltage, precordial leads Borderline T abnormalities, anterior leads No significant change since last tracing Confirmed by Sabryn Preslar MD, Aamari Strawderman [phone]) on 08/02/2016 3:45:18 PM         Radiology Dg Chest 2 View  Result Date:  08/02/2016 CLINICAL DATA:  Dizziness today. EXAM: CHEST  2 VIEW COMPARISON:  None. FINDINGS: There is cardiomegaly without edema. Aortic atherosclerosis is noted. No pneumothorax or pleural fluid. No acute bony abnormality. IMPRESSION: Cardiomegaly without acute disease. Atherosclerosis. Electronically Signed   By: Inge Rise M.D.   On: 08/02/2016 15:54    Procedures Procedures (including critical care time) .Ear Cerumen Removal Date/Time: 08/02/2016 5:05 PM Performed by: Sharlett Iles Authorized by: Sharlett Iles   Consent:    Consent obtained:  Verbal   Consent given by:  Patient Procedure details:    Location:  R ear and L ear   Procedure type: curette   Post-procedure details:    Hearing quality:  Normal   Patient tolerance of procedure:  Tolerated well, no immediate complications    Medications Ordered in ED  Medications  sodium chloride 0.9 % bolus 1,000 mL (1,000 mLs Intravenous New Bag/Given 08/02/16 1557)     Initial Impression / Assessment and Plan / ED Course  I have reviewed the triage vital signs and the nursing notes.  Pertinent labs & imaging results that were available during my care of the patient were reviewed by me and considered in my medical decision making (see chart for details).     Pt brought from independent living facility for several days of generalized weakness and dizziness associated with cough, recently initiated course of Levaquin for UTI diagnosed at urgent care. She was awake, comfortable, and in no acute distress at presentation. She was afebrile, heart rate and BP reassuring, O2 94-95% on room air. No obvious wheezing or crackles. EKG unremarkable. Chest x-ray shows cardiomegaly without acute findings. CBC normal. Cr mildly elevated from baseline at 1.6 today. Gave 1L IVF.  We are awaiting the patient's UA. Her BNP is normal. Family incidentally noticed problems with cerumen. Remove cerumen with a curet and will have nurse  to irrigate bilateral ears. I'm signing out to the oncoming provider who will follow-up on UA. I anticipate discharge as patient is otherwise well-appearing with normal vital signs. She will follow-up with PCP later this week.  Final Clinical Impressions(s) / ED Diagnoses   Final diagnoses:  Dehydration  Weakness  Cough     New Prescriptions   No medications on file       Sharlett Iles, MD 08/02/16 [phone]

## 2016-08-04 LAB — URINE CULTURE
Culture: NO GROWTH
SPECIAL REQUESTS: NORMAL

## 2016-08-05 DIAGNOSIS — F0390 Unspecified dementia without behavioral disturbance: Secondary | ICD-10-CM | POA: Diagnosis not present

## 2016-08-05 DIAGNOSIS — R05 Cough: Secondary | ICD-10-CM | POA: Diagnosis not present

## 2016-08-05 DIAGNOSIS — J40 Bronchitis, not specified as acute or chronic: Secondary | ICD-10-CM | POA: Diagnosis not present

## 2016-08-05 DIAGNOSIS — N183 Chronic kidney disease, stage 3 (moderate): Secondary | ICD-10-CM | POA: Diagnosis not present

## 2016-08-05 DIAGNOSIS — R5381 Other malaise: Secondary | ICD-10-CM | POA: Diagnosis not present

## 2016-08-05 DIAGNOSIS — I10 Essential (primary) hypertension: Secondary | ICD-10-CM | POA: Diagnosis not present

## 2016-08-05 DIAGNOSIS — K219 Gastro-esophageal reflux disease without esophagitis: Secondary | ICD-10-CM | POA: Diagnosis not present

## 2016-08-12 DIAGNOSIS — M62562 Muscle wasting and atrophy, not elsewhere classified, left lower leg: Secondary | ICD-10-CM | POA: Diagnosis not present

## 2016-08-12 DIAGNOSIS — M62561 Muscle wasting and atrophy, not elsewhere classified, right lower leg: Secondary | ICD-10-CM | POA: Diagnosis not present

## 2016-08-12 DIAGNOSIS — R278 Other lack of coordination: Secondary | ICD-10-CM | POA: Diagnosis not present

## 2016-08-12 DIAGNOSIS — R2689 Other abnormalities of gait and mobility: Secondary | ICD-10-CM | POA: Diagnosis not present

## 2016-08-12 DIAGNOSIS — R531 Weakness: Secondary | ICD-10-CM | POA: Diagnosis not present

## 2016-08-12 DIAGNOSIS — F0390 Unspecified dementia without behavioral disturbance: Secondary | ICD-10-CM | POA: Diagnosis not present

## 2016-08-12 DIAGNOSIS — R2681 Unsteadiness on feet: Secondary | ICD-10-CM | POA: Diagnosis not present

## 2016-08-12 DIAGNOSIS — R5381 Other malaise: Secondary | ICD-10-CM | POA: Diagnosis not present

## 2016-08-26 DIAGNOSIS — Z6830 Body mass index (BMI) 30.0-30.9, adult: Secondary | ICD-10-CM | POA: Diagnosis not present

## 2016-08-26 DIAGNOSIS — R05 Cough: Secondary | ICD-10-CM | POA: Diagnosis not present

## 2016-08-26 DIAGNOSIS — J309 Allergic rhinitis, unspecified: Secondary | ICD-10-CM | POA: Diagnosis not present

## 2016-09-10 ENCOUNTER — Emergency Department (HOSPITAL_COMMUNITY)
Admission: EM | Admit: 2016-09-10 | Discharge: 2016-09-11 | Disposition: A | Discharge status: Home / Self Care | Payer: Medicare Other | Attending: Emergency Medicine | Admitting: Emergency Medicine

## 2016-09-10 ENCOUNTER — Encounter (HOSPITAL_COMMUNITY): Payer: Self-pay | Admitting: Emergency Medicine

## 2016-09-10 DIAGNOSIS — S79919A Unspecified injury of unspecified hip, initial encounter: Secondary | ICD-10-CM | POA: Diagnosis not present

## 2016-09-10 DIAGNOSIS — S8991XA Unspecified injury of right lower leg, initial encounter: Secondary | ICD-10-CM | POA: Diagnosis present

## 2016-09-10 DIAGNOSIS — M7981 Nontraumatic hematoma of soft tissue: Secondary | ICD-10-CM | POA: Diagnosis not present

## 2016-09-10 DIAGNOSIS — S8011XA Contusion of right lower leg, initial encounter: Secondary | ICD-10-CM | POA: Insufficient documentation

## 2016-09-10 DIAGNOSIS — Z79899 Other long term (current) drug therapy: Secondary | ICD-10-CM | POA: Insufficient documentation

## 2016-09-10 DIAGNOSIS — Z7901 Long term (current) use of anticoagulants: Secondary | ICD-10-CM | POA: Insufficient documentation

## 2016-09-10 DIAGNOSIS — Z7982 Long term (current) use of aspirin: Secondary | ICD-10-CM | POA: Insufficient documentation

## 2016-09-10 DIAGNOSIS — Z7409 Other reduced mobility: Secondary | ICD-10-CM | POA: Diagnosis not present

## 2016-09-10 DIAGNOSIS — M7989 Other specified soft tissue disorders: Secondary | ICD-10-CM | POA: Diagnosis not present

## 2016-09-10 DIAGNOSIS — I10 Essential (primary) hypertension: Secondary | ICD-10-CM | POA: Diagnosis not present

## 2016-09-10 DIAGNOSIS — Y929 Unspecified place or not applicable: Secondary | ICD-10-CM | POA: Diagnosis not present

## 2016-09-10 DIAGNOSIS — X58XXXA Exposure to other specified factors, initial encounter: Secondary | ICD-10-CM | POA: Insufficient documentation

## 2016-09-10 DIAGNOSIS — R600 Localized edema: Secondary | ICD-10-CM | POA: Diagnosis not present

## 2016-09-10 DIAGNOSIS — Y999 Unspecified external cause status: Secondary | ICD-10-CM | POA: Insufficient documentation

## 2016-09-10 DIAGNOSIS — Y9389 Activity, other specified: Secondary | ICD-10-CM | POA: Insufficient documentation

## 2016-09-10 DIAGNOSIS — Z96643 Presence of artificial hip joint, bilateral: Secondary | ICD-10-CM | POA: Diagnosis not present

## 2016-09-10 DIAGNOSIS — R609 Edema, unspecified: Secondary | ICD-10-CM | POA: Diagnosis not present

## 2016-09-10 HISTORY — DX: Unspecified atrial fibrillation: I48.91

## 2016-09-10 NOTE — ED Triage Notes (Signed)
Pt to ED from [facility] independent living c/o sudden onset R lower leg swelling, bruising, and severe pain in calf. Pain worsens with dorsiflexion. Per PTAR, pt had called facility RN around 10am today c/o pain in lower leg - she was told to put ice on it. Another RN came to check on it about 2 hours ago and noticed blue bruising and swelling to R lower leg and foot. Pt on Xarelto (hx A-Fib). Pt denies injury/fall. Sent here for eval of DVT. Pt A&O x 4.

## 2016-09-11 ENCOUNTER — Emergency Department (HOSPITAL_COMMUNITY): Payer: Medicare Other

## 2016-09-11 ENCOUNTER — Ambulatory Visit (HOSPITAL_COMMUNITY): Admission: RE | Admit: 2016-09-11 | Payer: Medicare Other | Source: Ambulatory Visit

## 2016-09-11 DIAGNOSIS — S8011XA Contusion of right lower leg, initial encounter: Secondary | ICD-10-CM | POA: Diagnosis not present

## 2016-09-11 DIAGNOSIS — R600 Localized edema: Secondary | ICD-10-CM | POA: Diagnosis not present

## 2016-09-11 DIAGNOSIS — R609 Edema, unspecified: Secondary | ICD-10-CM | POA: Diagnosis not present

## 2016-09-11 DIAGNOSIS — M79609 Pain in unspecified limb: Secondary | ICD-10-CM | POA: Diagnosis not present

## 2016-09-11 NOTE — ED Notes (Signed)
Patient transported to X-ray 

## 2016-09-11 NOTE — ED Provider Notes (Signed)
[facility] DEPT Provider Note   CSN: [id number] Arrival date & time: 09/10/16  2346  By signing my name below, I, Martha Hendrix, attest that this documentation has been prepared under the direction and in the presence of Merryl Hacker, MD. Electronically Signed: Oleh Hendrix, Scribe. 09/11/16. 12:11 AM.   History   Chief Complaint Chief Complaint  Patient presents with  . Leg Swelling    HPI Martha Hendrix is a 83 y.o. female with history of HTN and atrial fibrillation on Xarelto who presents to the ED for evaluation of leg swelling from independent living. This patient states that in the last 24 hours she has noticed increased swelling to the R lower leg with significant discoloration. At interview, she is denying any pain while at rest. No decreased sensation or paresthesias. No trauma. No chest pain or dyspnea. No abdominal pain. She has not attempted leg elevation.  The history is provided by the patient. No language interpreter was used.    Past Medical History:  Diagnosis Date  . Acute blood loss anemia   . Adenomatous colon polyp   . Anemia    as a child and never had a transfusion  . Arthritis    osteo, takes naprosyn daily  . Atrial fibrillation ([place])   . Bruises easily    on both legs  . Cataract   . Chronic back pain    stenosis  . Congenital spondylolysis, lumbosacral region   . Depression    takes Lexapro daily  . Dysphagia   . Esophageal stricture   . GERD (gastroesophageal reflux disease)    takes Protonix daily  . Glaucoma    uses eye drops  . Headache(784.0)    occasionally  . History of bronchitis    last time unknown  . History of colon polyps   . Hypertension    takes Diovan and Bystolic daily  . Insomnia    takes Ambien nightly  . Joint pain   . Joint swelling   . Nocturia   . Osteoarthritis of left hip 2015   Status post left hip replacement 10/20/13  . PONV (postoperative nausea and vomiting)   . Senile osteoporosis    . Spinal stenosis of lumbar region     Patient Active Problem List   Diagnosis Date Noted  . Acute blood loss anemia   . Senile osteoporosis   . Spinal stenosis of lumbar region   . Osteoarthritis of left hip   . Depression   . Chronic back pain   . Hypertension   . Unspecified constipation 10/28/2013  . UTI (urinary tract infection) 10/25/2013  . Overweight (BMI 25.0-29.9) 10/23/2013  . S/P left THA, AA 10/22/2013  . S/P lumbar laminectomy 07/19/2013  . ESOPHAGEAL STRICTURE 10/27/2008  . GERD 10/27/2008  . DYSPHAGIA 10/27/2008  . PERSONAL HX COLONIC POLYPS 10/27/2008    Past Surgical History:  Procedure Laterality Date  . APPENDECTOMY     as a child  . cataract surgery Left 2014  . COLONOSCOPY    . EPIDURAL BLOCK INJECTION    . ESOPHAGOGASTRODUODENOSCOPY     with ED  . JOINT REPLACEMENT Right 2001   right  . left arm surgery     d/t break as child  . LUMBAR LAMINECTOMY/DECOMPRESSION MICRODISCECTOMY Left 07/19/2013   Procedure: LUMBAR LAMINECTOMY/DECOMPRESSION MICRODISCECTOMY LUMBAR THREE-FOUR,LUMBAR FOUR-FIVE;  Surgeon: Eustace Moore, MD;  Location: [facility];  Service: Neurosurgery;  Laterality: Left;  . TOTAL HIP ARTHROPLASTY Right   .  TOTAL HIP ARTHROPLASTY Left 10/22/2013   Procedure: LEFT TOTAL HIP ARTHROPLASTY ANTERIOR APPROACH;  Surgeon: Mauri Pole, MD;  Location: [facility];  Service: Orthopedics;  Laterality: Left;    OB History    No data available       Home Medications    Prior to Admission medications   Medication Sig Start Date End Date Taking? Authorizing Provider  aspirin EC 325 MG tablet Take 1 tablet (325 mg total) by mouth daily. 05/25/15   Zada Finders, MD  docusate sodium 100 MG CAPS Take 100 mg by mouth 2 (two) times daily. Patient not taking: Reported on 05/25/2015 10/25/13   Danae Orleans, PA-C  donepezil (ARICEPT) 5 MG tablet Take 5 mg by mouth at bedtime.    Historical Provider, MD  escitalopram (LEXAPRO) 10 MG tablet Take 10 mg  by mouth every morning.     Historical Provider, MD  feeding supplement, ENSURE COMPLETE, (ENSURE COMPLETE) LIQD Take 237 mLs by mouth 2 (two) times daily between meals. Patient not taking: Reported on 05/25/2015 10/25/13   Danae Orleans, PA-C  ferrous sulfate 325 (65 FE) MG tablet Take 1 tablet (325 mg total) by mouth 2 (two) times daily with a meal. Patient not taking: Reported on 05/25/2015 10/25/13   Danae Orleans, PA-C  levofloxacin (LEVAQUIN) 500 MG tablet Take 500 mg by mouth daily. For 7 Days. 07/29/16   Historical Provider, MD  naproxen (NAPROSYN) 500 MG tablet Take 500 mg by mouth 2 (two) times daily with a meal.    Historical Provider, MD  pantoprazole (PROTONIX) 40 MG tablet Take 40 mg by mouth daily.    Historical Provider, MD  polyethylene glycol (MIRALAX / GLYCOLAX) packet Take 17 g by mouth 2 (two) times daily. Patient not taking: Reported on 05/25/2015 10/25/13   Danae Orleans, PA-C  Rivaroxaban (XARELTO) 15 MG TABS tablet Take 15 mg by mouth daily.    Historical Provider, MD  tiZANidine (ZANAFLEX) 4 MG capsule Take 1 capsule (4 mg total) by mouth 3 (three) times daily as needed for muscle spasms. Patient not taking: Reported on 05/25/2015 10/25/13   Danae Orleans, PA-C  valsartan-hydrochlorothiazide (DIOVAN-HCT) 160-12.5 MG per tablet Take 1 tablet by mouth every morning.     Historical Provider, MD  zolpidem (AMBIEN) 10 MG tablet Take 10 mg by mouth at bedtime as needed for sleep.    Historical Provider, MD    Family History No family history on file.  Social History Social History  Substance Use Topics  . Smoking status: Never Smoker  . Smokeless tobacco: Never Used  . Alcohol use Yes     Comment: glass of wine every 3-41months     Allergies   Codeine; Hydrocodone; and Celebrex [celecoxib]   Review of Systems Review of Systems  Respiratory: Negative for shortness of breath.   Cardiovascular: Positive for leg swelling. Negative for chest pain.        Discoloration to the R lower leg  Gastrointestinal: Negative for abdominal pain.  Musculoskeletal:       No leg pain  All other systems reviewed and are negative.    Physical Exam Updated Vital Signs BP 129/87   Pulse (!) 102   Resp 18   SpO2 97%   Physical Exam  Constitutional: She is oriented to person, place, and time. She appears well-developed and well-nourished. No distress.  HENT:  Head: Normocephalic and atraumatic.  Cardiovascular: Normal rate, regular rhythm and normal heart sounds.   No murmur heard. Pulmonary/Chest: Effort  normal. No respiratory distress. She has no wheezes.  Abdominal: Soft. There is no tenderness.  Musculoskeletal: She exhibits edema.  Swelling and hematoma noted to the right leg circumferentially, bruising noted into the right ankle, 1+ DP pulse, no significant tenderness to palpation, no blanching, compartment soft  Neurological: She is alert and oriented to person, place, and time.  Skin: Skin is warm and dry.  Psychiatric: She has a normal mood and affect.  Nursing note and vitals reviewed.    ED Treatments / Results  Labs (all labs ordered are listed, but only abnormal results are displayed) Labs Reviewed - No data to display  EKG  EKG Interpretation None       Radiology Dg Tibia/fibula Right  Result Date: 09/11/2016 CLINICAL DATA:  Hematoma for 2 days.  No known injury. EXAM: RIGHT TIBIA AND FIBULA - 2 VIEW COMPARISON:  None. FINDINGS: Right total knee arthroplasty in place. No periprostatic lucency. Cortical margins of the tibia and fibula are intact. No periosteal reaction or bony destructive change. Ankle alignment is maintained. There is soft tissue edema of the lower leg. No focal soft tissue prominence corresponding to clinical hematoma. No soft tissue air or radiopaque foreign body. IMPRESSION: Diffuse soft tissue edema.  No acute osseous abnormality. Electronically Signed   By: Jeb Levering M.D.   On: 09/11/2016 01:17     Procedures Procedures (including critical care time)  Medications Ordered in ED Medications - No data to display   Initial Impression / Assessment and Plan / ED Course  I have reviewed the triage vital signs and the nursing notes.  Pertinent labs & imaging results that were available during my care of the patient were reviewed by me and considered in my medical decision making (see chart for details).     Patient presents with atraumatic swelling and discoloration of the right lower extremity. She's currently on Xarelto. She appears to have extensive hematoma circumferentially with bruising into the right foot. Feel that a DVT is less likely given that she is on Xarelto. She denies any injury. This hematoma is likely spontaneous. Her leg was wrapped and elevated. X-rays are negative. Recommend compression, elevation, and ice as needed. Outpatient ultrasound was ordered to confirm no DVT.  After history, exam, and medical workup I feel the patient has been appropriately medically screened and is safe for discharge home. Pertinent diagnoses were discussed with the patient. Patient was given return precautions.   Final Clinical Impressions(s) / ED Diagnoses   Final diagnoses:  Leg swelling  Leg hematoma, right, initial encounter    New Prescriptions New Prescriptions   No medications on file   I personally performed the services described in this documentation, which was scribed in my presence. The recorded information has been reviewed and is accurate.    Merryl Hacker, MD 09/11/16 [phone]

## 2016-09-11 NOTE — ED Notes (Addendum)
Pt's leg ace wrapped, iced and elevated.

## 2016-09-11 NOTE — Discharge Instructions (Signed)
You were seen today for swelling and discoloration of her right leg. This is likely a spontaneous hematoma related to your Xarelto.  Keep an Ace wrap applied. Keep it elevated and ice as needed. I feel it is less likely it is a blood clot given the you are on Xarelto; however, you will be ordered an ultrasound of your right lower extremity.

## 2016-09-12 DIAGNOSIS — I480 Paroxysmal atrial fibrillation: Secondary | ICD-10-CM | POA: Diagnosis not present

## 2016-09-12 DIAGNOSIS — I872 Venous insufficiency (chronic) (peripheral): Secondary | ICD-10-CM | POA: Diagnosis not present

## 2016-09-12 DIAGNOSIS — R233 Spontaneous ecchymoses: Secondary | ICD-10-CM | POA: Diagnosis not present

## 2016-09-12 DIAGNOSIS — Z6830 Body mass index (BMI) 30.0-30.9, adult: Secondary | ICD-10-CM | POA: Diagnosis not present

## 2016-09-14 DIAGNOSIS — E784 Other hyperlipidemia: Secondary | ICD-10-CM | POA: Diagnosis not present

## 2016-09-14 DIAGNOSIS — Z6830 Body mass index (BMI) 30.0-30.9, adult: Secondary | ICD-10-CM | POA: Diagnosis not present

## 2016-09-14 DIAGNOSIS — N183 Chronic kidney disease, stage 3 (moderate): Secondary | ICD-10-CM | POA: Diagnosis not present

## 2016-09-14 DIAGNOSIS — I480 Paroxysmal atrial fibrillation: Secondary | ICD-10-CM | POA: Diagnosis not present

## 2016-09-14 DIAGNOSIS — R5381 Other malaise: Secondary | ICD-10-CM | POA: Diagnosis not present

## 2016-09-14 DIAGNOSIS — M7989 Other specified soft tissue disorders: Secondary | ICD-10-CM | POA: Diagnosis not present

## 2016-09-14 DIAGNOSIS — I129 Hypertensive chronic kidney disease with stage 1 through stage 4 chronic kidney disease, or unspecified chronic kidney disease: Secondary | ICD-10-CM | POA: Diagnosis not present

## 2016-09-14 DIAGNOSIS — R233 Spontaneous ecchymoses: Secondary | ICD-10-CM | POA: Diagnosis not present

## 2016-09-14 DIAGNOSIS — K219 Gastro-esophageal reflux disease without esophagitis: Secondary | ICD-10-CM | POA: Diagnosis not present

## 2016-09-14 DIAGNOSIS — I872 Venous insufficiency (chronic) (peripheral): Secondary | ICD-10-CM | POA: Diagnosis not present

## 2016-09-14 DIAGNOSIS — M79661 Pain in right lower leg: Secondary | ICD-10-CM | POA: Diagnosis not present

## 2016-09-14 DIAGNOSIS — Q762 Congenital spondylolisthesis: Secondary | ICD-10-CM | POA: Diagnosis not present

## 2016-09-14 DIAGNOSIS — M5489 Other dorsalgia: Secondary | ICD-10-CM | POA: Diagnosis not present

## 2016-09-29 DIAGNOSIS — R233 Spontaneous ecchymoses: Secondary | ICD-10-CM | POA: Diagnosis not present

## 2016-09-29 DIAGNOSIS — D649 Anemia, unspecified: Secondary | ICD-10-CM | POA: Diagnosis not present

## 2016-10-05 DIAGNOSIS — R488 Other symbolic dysfunctions: Secondary | ICD-10-CM | POA: Diagnosis not present

## 2016-10-05 DIAGNOSIS — G301 Alzheimer's disease with late onset: Secondary | ICD-10-CM | POA: Diagnosis not present

## 2016-10-06 DIAGNOSIS — G301 Alzheimer's disease with late onset: Secondary | ICD-10-CM | POA: Diagnosis not present

## 2016-10-06 DIAGNOSIS — R488 Other symbolic dysfunctions: Secondary | ICD-10-CM | POA: Diagnosis not present

## 2016-10-10 DIAGNOSIS — G301 Alzheimer's disease with late onset: Secondary | ICD-10-CM | POA: Diagnosis not present

## 2016-10-10 DIAGNOSIS — R488 Other symbolic dysfunctions: Secondary | ICD-10-CM | POA: Diagnosis not present

## 2016-10-11 DIAGNOSIS — G301 Alzheimer's disease with late onset: Secondary | ICD-10-CM | POA: Diagnosis not present

## 2016-10-11 DIAGNOSIS — R488 Other symbolic dysfunctions: Secondary | ICD-10-CM | POA: Diagnosis not present

## 2016-10-18 DIAGNOSIS — G301 Alzheimer's disease with late onset: Secondary | ICD-10-CM | POA: Diagnosis not present

## 2016-10-18 DIAGNOSIS — R488 Other symbolic dysfunctions: Secondary | ICD-10-CM | POA: Diagnosis not present

## 2016-11-21 DIAGNOSIS — H401132 Primary open-angle glaucoma, bilateral, moderate stage: Secondary | ICD-10-CM | POA: Diagnosis not present

## 2016-11-21 DIAGNOSIS — Z961 Presence of intraocular lens: Secondary | ICD-10-CM | POA: Diagnosis not present

## 2016-11-21 DIAGNOSIS — H25811 Combined forms of age-related cataract, right eye: Secondary | ICD-10-CM | POA: Diagnosis not present

## 2016-11-21 DIAGNOSIS — H43813 Vitreous degeneration, bilateral: Secondary | ICD-10-CM | POA: Diagnosis not present

## 2017-01-16 DIAGNOSIS — R8290 Unspecified abnormal findings in urine: Secondary | ICD-10-CM | POA: Diagnosis not present

## 2017-01-16 DIAGNOSIS — E784 Other hyperlipidemia: Secondary | ICD-10-CM | POA: Diagnosis not present

## 2017-01-16 DIAGNOSIS — N390 Urinary tract infection, site not specified: Secondary | ICD-10-CM | POA: Diagnosis not present

## 2017-01-16 DIAGNOSIS — I10 Essential (primary) hypertension: Secondary | ICD-10-CM | POA: Diagnosis not present

## 2017-01-23 DIAGNOSIS — Z0000 Encounter for general adult medical examination without abnormal findings: Secondary | ICD-10-CM | POA: Diagnosis not present

## 2017-01-23 DIAGNOSIS — E784 Other hyperlipidemia: Secondary | ICD-10-CM | POA: Diagnosis not present

## 2017-01-23 DIAGNOSIS — I129 Hypertensive chronic kidney disease with stage 1 through stage 4 chronic kidney disease, or unspecified chronic kidney disease: Secondary | ICD-10-CM | POA: Diagnosis not present

## 2017-01-23 DIAGNOSIS — M1990 Unspecified osteoarthritis, unspecified site: Secondary | ICD-10-CM | POA: Diagnosis not present

## 2017-01-23 DIAGNOSIS — Z6829 Body mass index (BMI) 29.0-29.9, adult: Secondary | ICD-10-CM | POA: Diagnosis not present

## 2017-01-23 DIAGNOSIS — F0390 Unspecified dementia without behavioral disturbance: Secondary | ICD-10-CM | POA: Diagnosis not present

## 2017-01-23 DIAGNOSIS — R5381 Other malaise: Secondary | ICD-10-CM | POA: Diagnosis not present

## 2017-01-23 DIAGNOSIS — I480 Paroxysmal atrial fibrillation: Secondary | ICD-10-CM | POA: Diagnosis not present

## 2017-01-23 DIAGNOSIS — N183 Chronic kidney disease, stage 3 (moderate): Secondary | ICD-10-CM | POA: Diagnosis not present

## 2017-01-23 DIAGNOSIS — Z1389 Encounter for screening for other disorder: Secondary | ICD-10-CM | POA: Diagnosis not present

## 2017-01-23 DIAGNOSIS — I872 Venous insufficiency (chronic) (peripheral): Secondary | ICD-10-CM | POA: Diagnosis not present

## 2017-01-23 DIAGNOSIS — I10 Essential (primary) hypertension: Secondary | ICD-10-CM | POA: Diagnosis not present

## 2017-01-30 DIAGNOSIS — Z1231 Encounter for screening mammogram for malignant neoplasm of breast: Secondary | ICD-10-CM | POA: Diagnosis not present

## 2017-02-07 DIAGNOSIS — R922 Inconclusive mammogram: Secondary | ICD-10-CM | POA: Diagnosis not present

## 2017-04-03 DIAGNOSIS — R413 Other amnesia: Secondary | ICD-10-CM | POA: Diagnosis not present

## 2017-04-03 DIAGNOSIS — Q762 Congenital spondylolisthesis: Secondary | ICD-10-CM | POA: Diagnosis not present

## 2017-04-03 DIAGNOSIS — I129 Hypertensive chronic kidney disease with stage 1 through stage 4 chronic kidney disease, or unspecified chronic kidney disease: Secondary | ICD-10-CM | POA: Diagnosis not present

## 2017-04-03 DIAGNOSIS — R5381 Other malaise: Secondary | ICD-10-CM | POA: Diagnosis not present

## 2017-04-03 DIAGNOSIS — R0609 Other forms of dyspnea: Secondary | ICD-10-CM | POA: Diagnosis not present

## 2017-04-03 DIAGNOSIS — Z6830 Body mass index (BMI) 30.0-30.9, adult: Secondary | ICD-10-CM | POA: Diagnosis not present

## 2017-04-03 DIAGNOSIS — N183 Chronic kidney disease, stage 3 (moderate): Secondary | ICD-10-CM | POA: Diagnosis not present

## 2017-04-03 DIAGNOSIS — F0390 Unspecified dementia without behavioral disturbance: Secondary | ICD-10-CM | POA: Diagnosis not present

## 2017-04-03 DIAGNOSIS — I872 Venous insufficiency (chronic) (peripheral): Secondary | ICD-10-CM | POA: Diagnosis not present

## 2017-04-03 DIAGNOSIS — M5489 Other dorsalgia: Secondary | ICD-10-CM | POA: Diagnosis not present

## 2017-04-03 DIAGNOSIS — E7849 Other hyperlipidemia: Secondary | ICD-10-CM | POA: Diagnosis not present

## 2017-04-03 DIAGNOSIS — Z23 Encounter for immunization: Secondary | ICD-10-CM | POA: Diagnosis not present

## 2017-04-25 ENCOUNTER — Encounter (HOSPITAL_COMMUNITY): Payer: Self-pay | Admitting: *Deleted

## 2017-04-25 ENCOUNTER — Inpatient Hospital Stay (HOSPITAL_COMMUNITY)
Admission: EM | Admit: 2017-04-25 | Discharge: 2017-04-30 | DRG: 689 | Disposition: A | Discharge status: Skilled Nursing Facility | Payer: Medicare Other | Attending: Family Medicine | Admitting: Family Medicine

## 2017-04-25 ENCOUNTER — Other Ambulatory Visit: Payer: Self-pay

## 2017-04-25 ENCOUNTER — Emergency Department (HOSPITAL_COMMUNITY): Payer: Medicare Other

## 2017-04-25 DIAGNOSIS — Z66 Do not resuscitate: Secondary | ICD-10-CM | POA: Diagnosis present

## 2017-04-25 DIAGNOSIS — A419 Sepsis, unspecified organism: Secondary | ICD-10-CM | POA: Diagnosis not present

## 2017-04-25 DIAGNOSIS — I480 Paroxysmal atrial fibrillation: Secondary | ICD-10-CM

## 2017-04-25 DIAGNOSIS — R531 Weakness: Secondary | ICD-10-CM | POA: Diagnosis not present

## 2017-04-25 DIAGNOSIS — F329 Major depressive disorder, single episode, unspecified: Secondary | ICD-10-CM | POA: Diagnosis present

## 2017-04-25 DIAGNOSIS — Z96641 Presence of right artificial hip joint: Secondary | ICD-10-CM | POA: Diagnosis present

## 2017-04-25 DIAGNOSIS — I10 Essential (primary) hypertension: Secondary | ICD-10-CM | POA: Diagnosis present

## 2017-04-25 DIAGNOSIS — K210 Gastro-esophageal reflux disease with esophagitis: Secondary | ICD-10-CM

## 2017-04-25 DIAGNOSIS — Z79899 Other long term (current) drug therapy: Secondary | ICD-10-CM

## 2017-04-25 DIAGNOSIS — G4700 Insomnia, unspecified: Secondary | ICD-10-CM | POA: Diagnosis present

## 2017-04-25 DIAGNOSIS — Z7982 Long term (current) use of aspirin: Secondary | ICD-10-CM

## 2017-04-25 DIAGNOSIS — M810 Age-related osteoporosis without current pathological fracture: Secondary | ICD-10-CM | POA: Diagnosis present

## 2017-04-25 DIAGNOSIS — F0390 Unspecified dementia without behavioral disturbance: Secondary | ICD-10-CM | POA: Diagnosis not present

## 2017-04-25 DIAGNOSIS — N12 Tubulo-interstitial nephritis, not specified as acute or chronic: Secondary | ICD-10-CM

## 2017-04-25 DIAGNOSIS — H409 Unspecified glaucoma: Secondary | ICD-10-CM | POA: Diagnosis present

## 2017-04-25 DIAGNOSIS — R069 Unspecified abnormalities of breathing: Secondary | ICD-10-CM | POA: Diagnosis not present

## 2017-04-25 DIAGNOSIS — M1612 Unilateral primary osteoarthritis, left hip: Secondary | ICD-10-CM | POA: Diagnosis present

## 2017-04-25 DIAGNOSIS — R748 Abnormal levels of other serum enzymes: Secondary | ICD-10-CM

## 2017-04-25 DIAGNOSIS — Z885 Allergy status to narcotic agent status: Secondary | ICD-10-CM

## 2017-04-25 DIAGNOSIS — K219 Gastro-esophageal reflux disease without esophagitis: Secondary | ICD-10-CM | POA: Diagnosis not present

## 2017-04-25 DIAGNOSIS — N10 Acute tubulo-interstitial nephritis: Secondary | ICD-10-CM | POA: Diagnosis not present

## 2017-04-25 DIAGNOSIS — D72828 Other elevated white blood cell count: Secondary | ICD-10-CM

## 2017-04-25 DIAGNOSIS — N281 Cyst of kidney, acquired: Secondary | ICD-10-CM | POA: Diagnosis present

## 2017-04-25 DIAGNOSIS — D72829 Elevated white blood cell count, unspecified: Secondary | ICD-10-CM | POA: Diagnosis not present

## 2017-04-25 DIAGNOSIS — Z7901 Long term (current) use of anticoagulants: Secondary | ICD-10-CM

## 2017-04-25 DIAGNOSIS — E876 Hypokalemia: Secondary | ICD-10-CM | POA: Diagnosis not present

## 2017-04-25 DIAGNOSIS — M48061 Spinal stenosis, lumbar region without neurogenic claudication: Secondary | ICD-10-CM | POA: Diagnosis present

## 2017-04-25 DIAGNOSIS — Z888 Allergy status to other drugs, medicaments and biological substances status: Secondary | ICD-10-CM

## 2017-04-25 DIAGNOSIS — R0602 Shortness of breath: Secondary | ICD-10-CM | POA: Diagnosis not present

## 2017-04-25 DIAGNOSIS — Z9842 Cataract extraction status, left eye: Secondary | ICD-10-CM

## 2017-04-25 DIAGNOSIS — R0600 Dyspnea, unspecified: Secondary | ICD-10-CM

## 2017-04-25 LAB — COMPREHENSIVE METABOLIC PANEL
ALBUMIN: 2.6 g/dL — AB (ref 3.5–5.0)
ALK PHOS: 211 U/L — AB (ref 38–126)
ALT: 15 U/L (ref 14–54)
ANION GAP: 11 (ref 5–15)
AST: 18 U/L (ref 15–41)
BUN: 24 mg/dL — AB (ref 6–20)
CALCIUM: 8.6 mg/dL — AB (ref 8.9–10.3)
CO2: 24 mmol/L (ref 22–32)
Chloride: 98 mmol/L — ABNORMAL LOW (ref 101–111)
Creatinine, Ser: 1.29 mg/dL — ABNORMAL HIGH (ref 0.44–1.00)
GFR calc Af Amer: 42 mL/min — ABNORMAL LOW (ref >60)
GFR calc non Af Amer: 36 mL/min — ABNORMAL LOW (ref >60)
GLUCOSE: 98 mg/dL (ref 65–99)
Potassium: 3.3 mmol/L — ABNORMAL LOW (ref 3.5–5.1)
SODIUM: 133 mmol/L — AB (ref 135–145)
Total Bilirubin: 1.4 mg/dL — ABNORMAL HIGH (ref 0.3–1.2)
Total Protein: 6.6 g/dL (ref 6.5–8.1)

## 2017-04-25 LAB — URINALYSIS, ROUTINE W REFLEX MICROSCOPIC
BILIRUBIN URINE: NEGATIVE
Glucose, UA: NEGATIVE mg/dL
Ketones, ur: NEGATIVE mg/dL
NITRITE: NEGATIVE
PH: 5 (ref 5.0–8.0)
Protein, ur: NEGATIVE mg/dL
SPECIFIC GRAVITY, URINE: 1.02 (ref 1.005–1.030)

## 2017-04-25 LAB — CBC WITH DIFFERENTIAL/PLATELET
BASOS ABS: 0 10*3/uL (ref 0.0–0.1)
BASOS PCT: 0 %
EOS ABS: 0.2 10*3/uL (ref 0.0–0.7)
Eosinophils Relative: 2 %
HCT: 32.9 % — ABNORMAL LOW (ref 36.0–46.0)
HEMOGLOBIN: 10.6 g/dL — AB (ref 12.0–15.0)
LYMPHS PCT: 12 %
Lymphs Abs: 1.5 10*3/uL (ref 0.7–4.0)
MCH: 28.3 pg (ref 26.0–34.0)
MCHC: 32.2 g/dL (ref 30.0–36.0)
MCV: 88 fL (ref 78.0–100.0)
Monocytes Absolute: 2.1 10*3/uL — ABNORMAL HIGH (ref 0.1–1.0)
Monocytes Relative: 17 %
NEUTROS ABS: 8.2 10*3/uL — AB (ref 1.7–7.7)
Neutrophils Relative %: 69 %
Platelets: 475 10*3/uL — ABNORMAL HIGH (ref 150–400)
RBC: 3.74 MIL/uL — AB (ref 3.87–5.11)
RDW: 15.4 % (ref 11.5–15.5)
WBC: 12 10*3/uL — AB (ref 4.0–10.5)

## 2017-04-25 LAB — D-DIMER, QUANTITATIVE: D-Dimer, Quant: 3.51 ug/mL-FEU — ABNORMAL HIGH (ref 0.00–0.50)

## 2017-04-25 LAB — I-STAT TROPONIN, ED: Troponin i, poc: 0.01 ng/mL (ref 0.00–0.08)

## 2017-04-25 LAB — I-STAT CG4 LACTIC ACID, ED: Lactic Acid, Venous: 1.49 mmol/L (ref 0.5–1.9)

## 2017-04-25 LAB — BRAIN NATRIURETIC PEPTIDE: B Natriuretic Peptide: 413.1 pg/mL — ABNORMAL HIGH (ref 0.0–100.0)

## 2017-04-25 MED ORDER — ONDANSETRON HCL 4 MG/2ML IJ SOLN: 4.0000 mg | Freq: Four times a day (QID) | INTRAMUSCULAR | Status: DC | PRN

## 2017-04-25 MED ORDER — NAPROXEN 250 MG PO TABS: 500.0000 mg | ORAL_TABLET | Freq: Two times a day (BID) | ORAL | Status: DC | PRN

## 2017-04-25 MED ORDER — SODIUM CHLORIDE 0.45 % IV SOLN
INTRAVENOUS | Status: DC
  Administered 2017-04-25: 1 mL via INTRAVENOUS
  Administered 2017-04-25 – 2017-04-26 (×2): via INTRAVENOUS

## 2017-04-25 MED ORDER — DONEPEZIL HCL 5 MG PO TABS
5.0000 mg | ORAL_TABLET | Freq: Every day | ORAL | Status: DC
  Administered 2017-04-25 – 2017-04-29 (×5): 5 mg via ORAL
  Filled 2017-04-25 (×5): qty 1

## 2017-04-25 MED ORDER — VALSARTAN-HYDROCHLOROTHIAZIDE 160-12.5 MG PO TABS: 1.0000 | ORAL_TABLET | Freq: Every morning | ORAL | Status: DC

## 2017-04-25 MED ORDER — IRBESARTAN 150 MG PO TABS
150.0000 mg | ORAL_TABLET | Freq: Every day | ORAL | Status: DC
  Administered 2017-04-25 – 2017-04-30 (×6): 150 mg via ORAL
  Filled 2017-04-25 (×6): qty 1

## 2017-04-25 MED ORDER — POTASSIUM CHLORIDE CRYS ER 20 MEQ PO TBCR
40.0000 meq | EXTENDED_RELEASE_TABLET | Freq: Once | ORAL | Status: AC
  Administered 2017-04-25: 40 meq via ORAL
  Filled 2017-04-25: qty 2

## 2017-04-25 MED ORDER — HYDROCHLOROTHIAZIDE 12.5 MG PO CAPS
12.5000 mg | ORAL_CAPSULE | Freq: Every day | ORAL | Status: DC
  Administered 2017-04-26 – 2017-04-30 (×5): 12.5 mg via ORAL
  Filled 2017-04-25 (×5): qty 1

## 2017-04-25 MED ORDER — RIVAROXABAN 15 MG PO TABS
15.0000 mg | ORAL_TABLET | Freq: Every day | ORAL | Status: DC
Start: 2017-04-25 — End: 2017-04-30
  Administered 2017-04-25 – 2017-04-30 (×6): 15 mg via ORAL
  Filled 2017-04-25 (×6): qty 1

## 2017-04-25 MED ORDER — ASPIRIN EC 325 MG PO TBEC: 325.0000 mg | DELAYED_RELEASE_TABLET | Freq: Every day | ORAL | Status: DC

## 2017-04-25 MED ORDER — PANTOPRAZOLE SODIUM 40 MG PO TBEC
40.0000 mg | DELAYED_RELEASE_TABLET | Freq: Every day | ORAL | Status: DC
  Administered 2017-04-25 – 2017-04-30 (×6): 40 mg via ORAL
  Filled 2017-04-25 (×6): qty 1

## 2017-04-25 MED ORDER — TIZANIDINE HCL 4 MG PO CAPS: 4.0000 mg | ORAL_CAPSULE | Freq: Three times a day (TID) | ORAL | Status: DC | PRN

## 2017-04-25 MED ORDER — ZOLPIDEM TARTRATE 5 MG PO TABS
5.0000 mg | ORAL_TABLET | Freq: Every evening | ORAL | Status: DC | PRN
  Administered 2017-04-28: 5 mg via ORAL
  Filled 2017-04-25: qty 1

## 2017-04-25 MED ORDER — ONDANSETRON HCL 4 MG PO TABS: 4.0000 mg | ORAL_TABLET | Freq: Four times a day (QID) | ORAL | Status: DC | PRN

## 2017-04-25 MED ORDER — SODIUM CHLORIDE 0.9 % IV SOLN
Freq: Once | INTRAVENOUS | Status: AC
  Administered 2017-04-25: 09:00:00 via INTRAVENOUS

## 2017-04-25 MED ORDER — DEXTROSE 5 % IV SOLN
1.0000 g | Freq: Once | INTRAVENOUS | Status: AC
  Administered 2017-04-25: 1 g via INTRAVENOUS
  Filled 2017-04-25: qty 10

## 2017-04-25 MED ORDER — ESCITALOPRAM OXALATE 10 MG PO TABS
10.0000 mg | ORAL_TABLET | Freq: Every morning | ORAL | Status: DC
  Administered 2017-04-25 – 2017-04-30 (×6): 10 mg via ORAL
  Filled 2017-04-25 (×6): qty 1

## 2017-04-25 MED ORDER — DEXTROSE 5 % IV SOLN
1.0000 g | INTRAVENOUS | Status: DC
  Administered 2017-04-26 – 2017-04-27 (×2): 1 g via INTRAVENOUS
  Filled 2017-04-25 (×3): qty 10

## 2017-04-25 NOTE — ED Notes (Signed)
Patient c/o generalized malaise x 1 days  C.o slight sob, states she is exp. Left upper flank pain worse with inspiration , states the pain comes and goes. Currently without pain.

## 2017-04-25 NOTE — Evaluation (Signed)
Physical Therapy Evaluation Patient Details Name: Martha Hendrix MRN: [id number] DOB: 1927/05/01 Today's Date: 04/25/2017   History of Present Illness  Martha Eisenhart Archibaldis a 83 y.o.femalewith medical history significant ofspinal stenosis, insomnia, hypertension, headaches, GERD, esophageal stricture, depression, dementia, atrial fibrillation, anemia. Symptoms started 24 hours prior to admission with a complaint of generalized weakness and shortness of breath.   Clinical Impression  Pt admitted with above diagnosis. Pt currently with functional limitations due to the deficits listed below (see PT Problem List). Pt currently limited with mobility, O2 sats dropped to 89% on RA, HR up to 120 bpm, and DOE up to 3/4 with ambulation to bathroom and back. In addition, pt very unsteady and unable to ambulate with B UE support and min A. Pt not safe to return to her apt alone at this time.  Pt will benefit from skilled PT to increase their independence and safety with mobility to allow discharge to the venue listed below.       Follow Up Recommendations SNF;Supervision/Assistance - 24 hour    Equipment Recommendations  None recommended by PT    Recommendations for Other Services OT consult     Precautions / Restrictions Precautions Precautions: Fall Restrictions Weight Bearing Restrictions: No      Mobility  Bed Mobility Overal bed mobility: Needs Assistance Bed Mobility: Supine to Sit;Sit to Supine     Supine to sit: Min assist Sit to supine: Min assist   General bed mobility comments: min A at trunk for sup to sit, min A to LE's for return to supine  Transfers Overall transfer level: Needs assistance Equipment used: None Transfers: Sit to/from Stand Sit to Stand: Min assist         General transfer comment: pt unsteady in standing, min A to stabilize, pt with 2/4 DOE with mobility  Ambulation/Gait Ambulation/Gait assistance: Min assist Ambulation Distance (Feet):  15 Feet(2x) Assistive device: 1 person hand held assist Gait Pattern/deviations: Step-through pattern;Shuffle;Trunk flexed Gait velocity: decreased Gait velocity interpretation: <1.8 ft/sec, indicative of risk for recurrent falls General Gait Details: pt held therapist's hand but also needed other hand on IV pole for support. Pt had difficulty initiating gait, did a little better once she got going. O2 sats down to 89% on RA, HR 120 bpm  Stairs            Wheelchair Mobility    Modified Rankin (Stroke Patients Only)       Balance Overall balance assessment: Needs assistance Sitting-balance support: Bilateral upper extremity supported Sitting balance-Leahy Scale: Poor Sitting balance - Comments: unsteady sitting EOB   Standing balance support: Bilateral upper extremity supported Standing balance-Leahy Scale: Poor Standing balance comment: reliant on UE support                             Pertinent Vitals/Pain Pain Assessment: No/denies pain    Home Living Family/patient expects to be discharged to:: Skilled nursing facility                 Additional Comments: pt from independent living at [facility]    Prior Function Level of Independence: Independent         Comments: pt with RW and cane but did not use either one regularly.      Hand Dominance        Extremity/Trunk Assessment   Upper Extremity Assessment Upper Extremity Assessment: Overall WFL for tasks assessed    Lower Extremity  Assessment Lower Extremity Assessment: Generalized weakness    Cervical / Trunk Assessment Cervical / Trunk Assessment: Normal  Communication   Communication: No difficulties  Cognition Arousal/Alertness: Awake/alert Behavior During Therapy: WFL for tasks assessed/performed Overall Cognitive Status: History of cognitive impairments - at baseline                                 General Comments: in denial about her current state and  just wants to go home      General Comments      Exercises     Assessment/Plan    PT Assessment Patient needs continued PT services  PT Problem List Decreased strength;Decreased activity tolerance;Decreased balance;Decreased mobility;Decreased cognition;Decreased safety awareness;Decreased knowledge of precautions       PT Treatment Interventions DME instruction;Gait training;Functional mobility training;Therapeutic activities;Therapeutic exercise;Cognitive remediation    PT Goals (Current goals can be found in the Care Plan section)  Acute Rehab PT Goals Patient Stated Goal: wants to go back to her apt PT Goal Formulation: With patient Time For Goal Achievement: 05/09/17 Potential to Achieve Goals: Good    Frequency Min 3X/week   Barriers to discharge        Co-evaluation               AM-PAC PT "6 Clicks" Daily Activity  Outcome Measure Difficulty turning over in bed (including adjusting bedclothes, sheets and blankets)?: Unable Difficulty moving from lying on back to sitting on the side of the bed? : Unable Difficulty sitting down on and standing up from a chair with arms (e.g., wheelchair, bedside commode, etc,.)?: Unable Help needed moving to and from a bed to chair (including a wheelchair)?: A Lot Help needed walking in hospital room?: A Lot Help needed climbing 3-5 steps with a railing? : A Lot 6 Click Score: 9    End of Session Equipment Utilized During Treatment: Gait belt Activity Tolerance: Other (comment)(limited by DOE) Patient left: in bed;with call bell/phone within reach Nurse Communication: Mobility status PT Visit Diagnosis: Unsteadiness on feet (R26.81);Muscle weakness (generalized) (M62.81);Difficulty in walking, not elsewhere classified (R26.2)    Time: 1962-2297 PT Time Calculation (min) (ACUTE ONLY): 27 min   Charges:   PT Evaluation $PT Eval Moderate Complexity: 1 Mod PT Treatments $Gait Training: 8-22 mins   PT G Codes:   PT  G-Codes **NOT FOR INPATIENT CLASS** Functional Assessment Tool Used: AM-PAC 6 Clicks Basic Mobility Functional Limitation: Mobility: Walking and moving around Mobility: Walking and Moving Around Current Status (L8921): At least 60 percent but less than 80 percent impaired, limited or restricted Mobility: Walking and Moving Around Goal Status [phone]): At least 20 percent but less than 40 percent impaired, limited or restricted    [place], PT  Acute Rehab Services  [place] 04/25/2017, 5:06 PM

## 2017-04-25 NOTE — ED Provider Notes (Signed)
[facility] EMERGENCY DEPARTMENT Provider Note   CSN: [id number] Arrival date & time: 04/25/17  0076     History   Chief Complaint Chief Complaint  Patient presents with  . Shortness of Breath    HPI Martha Hendrix is a 83 y.o. female.  HPI   83 year old female with past medical history as below including atrial fibrillation, mild dementia, who presents with shortness of breath and weakness.  The patient states that she has been generally more tired than usual for the last 24 hours.  She states that others have noticed she seems more short of breath, though she denies any shortness of breath at this time.  She does report that she has a sharp, stabbing, left-sided pain that is worse with inspiration.  Denies any cough or sputum production.  She denies any fevers that she is aware of.  No abdominal pain, nausea, or vomiting.  She denies any dysuria, frequency, or other complaints.  No alleviating factors.  She does report that she is had mildly decreased appetite for the last 2-3 days.  Past Medical History:  Diagnosis Date  . Acute blood loss anemia   . Adenomatous colon polyp   . Anemia    as a child and never had a transfusion  . Arthritis    osteo, takes naprosyn daily  . Atrial fibrillation ([place])   . Bruises easily    on both legs  . Cataract   . Chronic back pain    stenosis  . Congenital spondylolysis, lumbosacral region   . Depression    takes Lexapro daily  . Dysphagia   . Esophageal stricture   . GERD (gastroesophageal reflux disease)    takes Protonix daily  . Glaucoma    uses eye drops  . Headache(784.0)    occasionally  . History of bronchitis    last time unknown  . History of colon polyps   . Hypertension    takes Diovan and Bystolic daily  . Insomnia    takes Ambien nightly  . Joint pain   . Joint swelling   . Nocturia   . Osteoarthritis of left hip 2015   Status post left hip replacement 10/20/13  . PONV (postoperative  nausea and vomiting)   . Senile osteoporosis   . Spinal stenosis of lumbar region     Patient Active Problem List   Diagnosis Date Noted  . Pyelonephritis 04/25/2017  . Dementia 04/25/2017  . PAF (paroxysmal atrial fibrillation) ([place]) 04/25/2017  . Hypokalemia 04/25/2017  . Acute blood loss anemia   . Senile osteoporosis   . Spinal stenosis of lumbar region   . Osteoarthritis of left hip   . Depression   . Chronic back pain   . Hypertension   . Unspecified constipation 10/28/2013  . UTI (urinary tract infection) 10/25/2013  . Overweight (BMI 25.0-29.9) 10/23/2013  . S/P left THA, AA 10/22/2013  . S/P lumbar laminectomy 07/19/2013  . ESOPHAGEAL STRICTURE 10/27/2008  . GERD 10/27/2008  . DYSPHAGIA 10/27/2008  . PERSONAL HX COLONIC POLYPS 10/27/2008    Past Surgical History:  Procedure Laterality Date  . APPENDECTOMY     as a child  . cataract surgery Left 2014  . COLONOSCOPY    . EPIDURAL BLOCK INJECTION    . ESOPHAGOGASTRODUODENOSCOPY     with ED  . JOINT REPLACEMENT Right 2001   right  . left arm surgery     d/t break as child  . TOTAL HIP ARTHROPLASTY  Right     OB History    No data available       Home Medications    Prior to Admission medications   Medication Sig Start Date End Date Taking? Authorizing Provider  BYSTOLIC 10 MG tablet Take 10 tablets daily by mouth. 03/22/17  Yes [provider]  donepezil (ARICEPT) 10 MG tablet Take 10 mg daily by mouth. 03/22/17  Yes [provider]  escitalopram (LEXAPRO) 10 MG tablet Take 10 mg by mouth every morning.    Yes [provider]  latanoprost (XALATAN) 0.005 % ophthalmic solution Place 1 drop daily into both eyes. 04/12/17  Yes [provider]  naproxen (NAPROSYN) 500 MG tablet Take 500 mg by mouth 2 (two) times daily with a meal.   Yes [provider]  pantoprazole (PROTONIX) 40 MG tablet Take 40 mg by mouth daily.   Yes [provider]  Rivaroxaban  (XARELTO) 15 MG TABS tablet Take 15 mg by mouth daily.   Yes [provider]  valsartan-hydrochlorothiazide (DIOVAN-HCT) 160-12.5 MG per tablet Take 1 tablet by mouth every morning.    Yes [provider]  zolpidem (AMBIEN) 10 MG tablet Take 10 mg by mouth at bedtime as needed for sleep.   Yes [provider]  aspirin EC 325 MG tablet Take 1 tablet (325 mg total) by mouth daily. Patient not taking: Reported on 04/25/2017 05/25/15   Zada Finders, MD  docusate sodium 100 MG CAPS Take 100 mg by mouth 2 (two) times daily. Patient not taking: Reported on 05/25/2015 10/25/13   Danae Orleans, PA-C  feeding supplement, ENSURE COMPLETE, (ENSURE COMPLETE) LIQD Take 237 mLs by mouth 2 (two) times daily between meals. Patient not taking: Reported on 05/25/2015 10/25/13   Danae Orleans, PA-C  ferrous sulfate 325 (65 FE) MG tablet Take 1 tablet (325 mg total) by mouth 2 (two) times daily with a meal. Patient not taking: Reported on 05/25/2015 10/25/13   Danae Orleans, PA-C  levofloxacin (LEVAQUIN) 500 MG tablet Take 500 mg by mouth daily. For 7 Days. 07/29/16   [provider]  polyethylene glycol (MIRALAX / GLYCOLAX) packet Take 17 g by mouth 2 (two) times daily. Patient not taking: Reported on 05/25/2015 10/25/13   Danae Orleans, PA-C  tiZANidine (ZANAFLEX) 4 MG capsule Take 1 capsule (4 mg total) by mouth 3 (three) times daily as needed for muscle spasms. Patient not taking: Reported on 05/25/2015 10/25/13   Danae Orleans, PA-C    Family History Family History  Family history unknown: Yes    Social History Social History   Tobacco Use  . Smoking status: Never Smoker  . Smokeless tobacco: Never Used  Substance Use Topics  . Alcohol use: Yes    Comment: glass of wine every 3-74months  . Drug use: No     Allergies   Codeine; Hydrocodone; and Celebrex [celecoxib]   Review of Systems Review of Systems  Constitutional: Positive for fatigue. Negative  for chills and fever.  HENT: Negative for congestion, rhinorrhea and sore throat.   Eyes: Negative for visual disturbance.  Respiratory: Positive for shortness of breath. Negative for cough and wheezing.   Cardiovascular: Positive for chest pain. Negative for leg swelling.  Gastrointestinal: Negative for abdominal pain, diarrhea, nausea and vomiting.  Genitourinary: Negative for dysuria, flank pain, vaginal bleeding and vaginal discharge.  Musculoskeletal: Negative for neck pain.  Skin: Negative for rash.  Allergic/Immunologic: Negative for immunocompromised state.  Neurological: Positive for weakness. Negative for syncope and  headaches.  Hematological: Does not bruise/bleed easily.  All other systems reviewed and are negative.    Physical Exam Updated Vital Signs BP (!) 147/80   Pulse (!) 107   Temp 99.8 F (37.7 C) (Rectal)   Resp (!) 24   Ht 5' 5.5" (1.664 m)   Wt 65.8 kg (145 lb)   SpO2 96%   BMI 23.76 kg/m   Physical Exam  Constitutional: She appears well-developed and well-nourished. No distress.  HENT:  Head: Normocephalic and atraumatic.  Eyes: Conjunctivae are normal.  Neck: Neck supple.  Cardiovascular: Normal rate, regular rhythm and normal heart sounds. Exam reveals no friction rub.  No murmur heard. Pulmonary/Chest: Effort normal and breath sounds normal. No respiratory distress. She has no wheezes. She has no rales.  Abdominal: Soft. She exhibits no distension. There is tenderness.  Left-sided CVAT  Musculoskeletal: She exhibits no edema.  Neurological: She is alert. She exhibits normal muscle tone.  Pleasant, interactive.  Oriented to time and place.  Moves all extremities with 5 out of 5 strength.  Face symmetric.  Speech is normal.  Skin: Skin is warm. Capillary refill takes less than 2 seconds.  Psychiatric: She has a normal mood and affect.  Nursing note and vitals reviewed.    ED Treatments / Results  Labs (all labs ordered are listed, but only  abnormal results are displayed) Labs Reviewed  CBC WITH DIFFERENTIAL/PLATELET - Abnormal; Notable for the following components:      Result Value   WBC 12.0 (*)    RBC 3.74 (*)    Hemoglobin 10.6 (*)    HCT 32.9 (*)    Platelets 475 (*)    Neutro Abs 8.2 (*)    Monocytes Absolute 2.1 (*)    All other components within normal limits  COMPREHENSIVE METABOLIC PANEL - Abnormal; Notable for the following components:   Sodium 133 (*)    Potassium 3.3 (*)    Chloride 98 (*)    BUN 24 (*)    Creatinine, Ser 1.29 (*)    Calcium 8.6 (*)    Albumin 2.6 (*)    Alkaline Phosphatase 211 (*)    Total Bilirubin 1.4 (*)    GFR calc non Af Amer 36 (*)    GFR calc Af Amer 42 (*)    All other components within normal limits  BRAIN NATRIURETIC PEPTIDE - Abnormal; Notable for the following components:   B Natriuretic Peptide 413.1 (*)    All other components within normal limits  D-DIMER, QUANTITATIVE (NOT AT [facility]) - Abnormal; Notable for the following components:   D-Dimer, Quant 3.51 (*)    All other components within normal limits  URINALYSIS, ROUTINE W REFLEX MICROSCOPIC - Abnormal; Notable for the following components:   APPearance HAZY (*)    Hgb urine dipstick SMALL (*)    Leukocytes, UA LARGE (*)    Bacteria, UA MANY (*)    Squamous Epithelial / LPF 6-30 (*)    All other components within normal limits  URINE CULTURE  I-STAT TROPONIN, ED  I-STAT CG4 LACTIC ACID, ED  I-STAT CG4 LACTIC ACID, ED    EKG  EKG Interpretation  Date/Time:  Tuesday April 25 2017 07:00:24 EST Ventricular Rate:  95 PR Interval:    QRS Duration: 89 QT Interval:  355 QTC Calculation: 447 R Axis:   -11 Text Interpretation:  Atrial fibrillation Low voltage, precordial leads Borderline T wave abnormalities No significant change since last tracing Confirmed by Duffy Bruce [phone]) on  04/25/2017 7:22:57 AM       Radiology Dg Chest 2 View  Result Date: 04/25/2017 CLINICAL DATA:  Shortness of  breath.  Left flank pain. EXAM: CHEST  2 VIEW COMPARISON:  08/02/2016 . FINDINGS: Mediastinum and hilar structures normal. Cardiomegaly with normal pulmonary vascularity. Mild basilar atelectasis and/or scarring. Tiny left pleural effusion cannot be excluded. No pneumothorax. Diffuse thoracic spine osteopenia degenerative change . IMPRESSION: Mild basilar atelectasis and/or scarring . Tiny left pleural effusion cannot be excluded. No pneumothorax. Electronically Signed   By: Marcello Moores  Register   On: 04/25/2017 07:55    Procedures Procedures (including critical care time)  Medications Ordered in ED Medications  potassium chloride SA (K-DUR,KLOR-CON) CR tablet 40 mEq (not administered)  cefTRIAXone (ROCEPHIN) 1 g in dextrose 5 % 50 mL IVPB (not administered)  aspirin EC tablet 325 mg (not administered)  donepezil (ARICEPT) tablet 5 mg (not administered)  escitalopram (LEXAPRO) tablet 10 mg (not administered)  naproxen (NAPROSYN) tablet 500 mg (not administered)  pantoprazole (PROTONIX) EC tablet 40 mg (not administered)  Rivaroxaban (XARELTO) tablet 15 mg (not administered)  tiZANidine (ZANAFLEX) capsule 4 mg (not administered)  valsartan-hydrochlorothiazide (DIOVAN-HCT) 160-12.5 MG per tablet 1 tablet (not administered)  zolpidem (AMBIEN) tablet 10 mg (not administered)  0.45 % sodium chloride infusion (not administered)  ondansetron (ZOFRAN) tablet 4 mg (not administered)    Or  ondansetron (ZOFRAN) injection 4 mg (not administered)  cefTRIAXone (ROCEPHIN) 1 g in dextrose 5 % 50 mL IVPB (1 g Intravenous New Bag/Given 04/25/17 0926)  0.9 %  sodium chloride infusion ( Intravenous New Bag/Given 04/25/17 0925)     Initial Impression / Assessment and Plan / ED Course  I have reviewed the triage vital signs and the nursing notes.  Pertinent labs & imaging results that were available during my care of the patient were reviewed by me and considered in my medical decision making (see chart for  details).     3:63 AM 83 year old female with past medical history as above who presents with shortness of breath, generalized fatigue.  Patient also has pleuritic left-sided chest pain.  She is on Xarelto.  Will evaluate for possible pneumonia, occult infection with consideration of pyelo given CVAT, also PE.  Patient in agreement.  She is satting well on room air.  Lab work as above. Pt with leukocytosis, mild tachycardia, and UA c/f UTI with +CVAT - concern for early sepsis 2/2 pyelo. CMP with mild hyopkalemia. D-Dimer positive but confirmed with family that pt is on Xarelto and has been compliant, will continue tx and suspect this is more so 2/2 her pyelo, age, inflammation.   Final Clinical Impressions(s) / ED Diagnoses   Final diagnoses:  Pyelonephritis  Other elevated white blood cell (WBC) count    ED Discharge Orders    None       Duffy Bruce, MD 04/25/17 1143

## 2017-04-25 NOTE — ED Notes (Signed)
Patient eating lunch at this time.

## 2017-04-25 NOTE — Progress Notes (Signed)
Pt new admit from ED for UTI for observation, her daughter at the bedside, alert and oriented no complain of pain at this time.

## 2017-04-25 NOTE — H&P (Signed)
History and Physical    Martha Hendrix NLG:[id number] DOB: 1927-04-28 DOA: 04/25/2017  PCP: Burnard Bunting, MD Patient coming from: [facility]   Chief Complaint: generalized weakness and sob  HPI: Martha Hendrix is a 83 y.o. female with medical history significant of spinal stenosis, insomnia, hypertension, headaches, GERD, esophageal stricture, depression, dementia, atrial fibrillation, anemia. Symptoms started a partially 24 hours prior to admission with a complaint of generalized weakness and shortness of breath. At time of presentation to the ED and at admission patient has no complaints of shortness of breath. This seems to be worse related to simply feeling tired and winded as opposed to true shortness of breath. No change with exertion. Denies any cough, sputum production, chest pain, fevers, lower extremity swelling, orthopnea, nausea, vomiting, focal neurological deficit.  Level V caveat applies as pt w/ dementia that limits memory and recall. hsitory provided by pt and pts son and daughter in law  ED Course: CTX given. Objective findings below  Review of Systems: As per HPI otherwise all other systems reviewed and are negative  Ambulatory Status:no restrictions  Past Medical History:  Diagnosis Date  . Acute blood loss anemia   . Adenomatous colon polyp   . Anemia    as a child and never had a transfusion  . Arthritis    osteo, takes naprosyn daily  . Atrial fibrillation ([place])   . Bruises easily    on both legs  . Cataract   . Chronic back pain    stenosis  . Congenital spondylolysis, lumbosacral region   . Depression    takes Lexapro daily  . Dysphagia   . Esophageal stricture   . GERD (gastroesophageal reflux disease)    takes Protonix daily  . Glaucoma    uses eye drops  . Headache(784.0)    occasionally  . History of bronchitis    last time unknown  . History of colon polyps   . Hypertension    takes Diovan and Bystolic daily  . Insomnia    takes Ambien nightly  . Joint pain   . Joint swelling   . Nocturia   . Osteoarthritis of left hip 2015   Status post left hip replacement 10/20/13  . PONV (postoperative nausea and vomiting)   . Senile osteoporosis   . Spinal stenosis of lumbar region     Past Surgical History:  Procedure Laterality Date  . APPENDECTOMY     as a child  . cataract surgery Left 2014  . COLONOSCOPY    . EPIDURAL BLOCK INJECTION    . ESOPHAGOGASTRODUODENOSCOPY     with ED  . JOINT REPLACEMENT Right 2001   right  . left arm surgery     d/t break as child  . TOTAL HIP ARTHROPLASTY Right     Social History   Socioeconomic History  . Marital status: Widowed    Spouse name: Not on file  . Number of children: Not on file  . Years of education: Not on file  . Highest education level: Not on file  Social Needs  . Financial resource strain: Not on file  . Food insecurity - worry: Not on file  . Food insecurity - inability: Not on file  . Transportation needs - medical: Not on file  . Transportation needs - non-medical: Not on file  Occupational History  . Not on file  Tobacco Use  . Smoking status: Never Smoker  . Smokeless tobacco: Never Used  Substance and Sexual Activity  .  Alcohol use: Yes    Comment: glass of wine every 3-64months  . Drug use: No  . Sexual activity: Yes    Birth control/protection: Post-menopausal  Other Topics Concern  . Not on file  Social History Narrative   Patient is Widowed (2000). Lives in apartment,  Independent Living section at [facility] since 10/2013.   No Smoking history, minimal alcohol history    Continues to drive, attends social activities. Was active with water aerobics prior to hip pain(2014)   Patient has Advanced planning documents: HCPOA, Living Will             Allergies  Allergen Reactions  . Codeine Nausea And Vomiting and Other (See Comments)    "spacy feeling"  . Hydrocodone Nausea And Vomiting    spacey feeling    . Celebrex [Celecoxib]     Family History  Family history unknown: Yes      Prior to Admission medications   Medication Sig Start Date End Date Taking? Authorizing Provider  aspirin EC 325 MG tablet Take 1 tablet (325 mg total) by mouth daily. 05/25/15   Zada Finders, MD  docusate sodium 100 MG CAPS Take 100 mg by mouth 2 (two) times daily. Patient not taking: Reported on 05/25/2015 10/25/13   Danae Orleans, PA-C  donepezil (ARICEPT) 5 MG tablet Take 5 mg by mouth at bedtime.    [provider]  escitalopram (LEXAPRO) 10 MG tablet Take 10 mg by mouth every morning.     [provider]  feeding supplement, ENSURE COMPLETE, (ENSURE COMPLETE) LIQD Take 237 mLs by mouth 2 (two) times daily between meals. Patient not taking: Reported on 05/25/2015 10/25/13   Danae Orleans, PA-C  ferrous sulfate 325 (65 FE) MG tablet Take 1 tablet (325 mg total) by mouth 2 (two) times daily with a meal. Patient not taking: Reported on 05/25/2015 10/25/13   Danae Orleans, PA-C  levofloxacin (LEVAQUIN) 500 MG tablet Take 500 mg by mouth daily. For 7 Days. 07/29/16   [provider]  naproxen (NAPROSYN) 500 MG tablet Take 500 mg by mouth 2 (two) times daily with a meal.    [provider]  pantoprazole (PROTONIX) 40 MG tablet Take 40 mg by mouth daily.    [provider]  polyethylene glycol (MIRALAX / GLYCOLAX) packet Take 17 g by mouth 2 (two) times daily. Patient not taking: Reported on 05/25/2015 10/25/13   Danae Orleans, PA-C  Rivaroxaban (XARELTO) 15 MG TABS tablet Take 15 mg by mouth daily.    [provider]  tiZANidine (ZANAFLEX) 4 MG capsule Take 1 capsule (4 mg total) by mouth 3 (three) times daily as needed for muscle spasms. Patient not taking: Reported on 05/25/2015 10/25/13   Danae Orleans, PA-C  valsartan-hydrochlorothiazide (DIOVAN-HCT) 160-12.5 MG per tablet Take 1 tablet by mouth every morning.     [provider]  zolpidem  (AMBIEN) 10 MG tablet Take 10 mg by mouth at bedtime as needed for sleep.    [provider]    Physical Exam: Vitals:   04/25/17 0701 04/25/17 0702 04/25/17 0709 04/25/17 0733  BP: (!) 135/95     Pulse: 97     Resp: 18     Temp: 98.5 F (36.9 C)   99.8 F (37.7 C)  TempSrc: Oral   Rectal  SpO2: 93%  90%   Weight:  65.8 kg (145 lb)    Height:  5' 5.5" (1.664 m)       General:  Appears calm and comfortable Eyes:  PERRL, EOMI, normal lids, iris ENT:  grossly normal hearing, lips & tongue, mmm Neck:  no LAD, masses or thyromegaly Cardiovascular:  RRR, no m/r/g. No LE edema.  Respiratory:  CTA bilaterally, no w/r/r. Normal respiratory effort. Abdomen:  soft, ntnd, NABS Skin:  no rash or induration seen on limited exam Musculoskeletal:  grossly normal tone BUE/BLE, good ROM, no bony abnormality Psychiatric:  grossly normal mood and affect, speech fluent and appropriate, AOx3 Neurologic:  CN 2-12 grossly intact, moves all extremities in coordinated fashion, sensation intact  Labs on Admission: I have personally reviewed following labs and imaging studies  CBC: Recent Labs  Lab 04/25/17 0720  WBC 12.0*  NEUTROABS 8.2*  HGB 10.6*  HCT 32.9*  MCV 88.0  PLT 829*   Basic Metabolic Panel: Recent Labs  Lab 04/25/17 0720  NA 133*  K 3.3*  CL 98*  CO2 24  GLUCOSE 98  BUN 24*  CREATININE 1.29*  CALCIUM 8.6*   GFR: Estimated Creatinine Clearance: 27.7 mL/min (A) (by C-G formula based on SCr of 1.29 mg/dL (H)). Liver Function Tests: Recent Labs  Lab 04/25/17 0720  AST 18  ALT 15  ALKPHOS 211*  BILITOT 1.4*  PROT 6.6  ALBUMIN 2.6*   No results for input(s): LIPASE, AMYLASE in the last 168 hours. No results for input(s): AMMONIA in the last 168 hours. Coagulation Profile: No results for input(s): INR, PROTIME in the last 168 hours. Cardiac Enzymes: No results for input(s): CKTOTAL, CKMB, CKMBINDEX, TROPONINI in the last 168 hours. BNP (last 3  results) No results for input(s): PROBNP in the last 8760 hours. HbA1C: No results for input(s): HGBA1C in the last 72 hours. CBG: No results for input(s): GLUCAP in the last 168 hours. Lipid Profile: No results for input(s): CHOL, HDL, LDLCALC, TRIG, CHOLHDL, LDLDIRECT in the last 72 hours. Thyroid Function Tests: No results for input(s): TSH, T4TOTAL, FREET4, T3FREE, THYROIDAB in the last 72 hours. Anemia Panel: No results for input(s): VITAMINB12, FOLATE, FERRITIN, TIBC, IRON, RETICCTPCT in the last 72 hours. Urine analysis:    Component Value Date/Time   COLORURINE YELLOW 04/25/2017 0729   APPEARANCEUR HAZY (A) 04/25/2017 0729   LABSPEC 1.020 04/25/2017 0729   PHURINE 5.0 04/25/2017 0729   GLUCOSEU NEGATIVE 04/25/2017 0729   HGBUR SMALL (A) 04/25/2017 0729   BILIRUBINUR NEGATIVE 04/25/2017 0729   KETONESUR NEGATIVE 04/25/2017 0729   PROTEINUR NEGATIVE 04/25/2017 0729   UROBILINOGEN 0.2 10/24/2013 1644   NITRITE NEGATIVE 04/25/2017 0729   LEUKOCYTESUR LARGE (A) 04/25/2017 0729    Creatinine Clearance: Estimated Creatinine Clearance: 27.7 mL/min (A) (by C-G formula based on SCr of 1.29 mg/dL (H)).  Sepsis Labs: @LABRCNTIP (procalcitonin:4,lacticidven:4) )No results found for this or any previous visit (from the past 240 hour(s)).   Radiological Exams on Admission: Dg Chest 2 View  Result Date: 04/25/2017 CLINICAL DATA:  Shortness of breath.  Left flank pain. EXAM: CHEST  2 VIEW COMPARISON:  08/02/2016 . FINDINGS: Mediastinum and hilar structures normal. Cardiomegaly with normal pulmonary vascularity. Mild basilar atelectasis and/or scarring. Tiny left pleural effusion cannot be excluded. No pneumothorax. Diffuse thoracic spine osteopenia degenerative change . IMPRESSION: Mild basilar atelectasis and/or scarring . Tiny left pleural effusion cannot be excluded. No pneumothorax. Electronically Signed   By: Marcello Moores  Register   On: 04/25/2017 07:55    EKG: Independently  reviewed. Afib, no ACS.   Assessment/Plan Active Problems:   GERD   Hypertension   Pyelonephritis   Dementia  PAF (paroxysmal atrial fibrillation) (HCC)   Hypokalemia    Early Pyelonephritis: L CVA tenderness w/ frankly abnormal (though contaminated) UA. WBC 12 w/ L shift - UCX - Ceftriaxone - IVF  Hypokalemia: mild at 3.3. Kdur 73mEq given in ED - BMP in am  Afib: - contineu Xarelto  HTN: - continue valsartan and HCTZ  Dementia: mild and at baseline. No short term memory per family - continue Aricept - PT/OT/CSW for placement (son is Martha Hendrix at [phone], and daughter Martha Hendrix [phone]). Daughter is an Materials engineer and they are both very understanding of her situation and reasonable  GERD: - continue PPI  Depression: - contineu lexapro  DVT prophylaxis: Xarelto  Code Status: DNR  Family Communication: son and daughter  Disposition Plan: pending improvementi n sx and placement  Consults called: social work  Admission status: observation    Waldemar Dickens MD [facility]  If 7PM-7AM, please contact night-coverage [email or web address] Password [facility]  04/25/2017, 10:37 AM

## 2017-04-25 NOTE — NC FL2 (Signed)
[place] LEVEL OF CARE SCREENING TOOL     IDENTIFICATION  Patient Name: Martha Hendrix Birthdate: [birth year] Sex: female Admission Date (Current Location): 04/25/2017  The [facility] and [place] Number:  Herbalist and Address:  The [place], [place] [address] [place], [place] [address]      Provider Number: [id number]  Attending Physician Name and Address:  Waldemar Dickens, MD  Relative Name and Phone Number:       Current Level of Care: Hospital Recommended Level of Care: [place] Prior Approval Number:    Date Approved/Denied:   PASRR Number:   [id number]   Discharge Plan: SNF    Current Diagnoses: Patient Active Problem List   Diagnosis Date Noted  . Pyelonephritis 04/25/2017  . Dementia 04/25/2017  . PAF (paroxysmal atrial fibrillation) ([place]) 04/25/2017  . Hypokalemia 04/25/2017  . Acute blood loss anemia   . Senile osteoporosis   . Spinal stenosis of lumbar region   . Osteoarthritis of left hip   . Depression   . Chronic back pain   . Hypertension   . Unspecified constipation 10/28/2013  . UTI (urinary tract infection) 10/25/2013  . Overweight (BMI 25.0-29.9) 10/23/2013  . S/P left THA, AA 10/22/2013  . S/P lumbar laminectomy 07/19/2013  . ESOPHAGEAL STRICTURE 10/27/2008  . GERD 10/27/2008  . DYSPHAGIA 10/27/2008  . PERSONAL HX COLONIC POLYPS 10/27/2008    Orientation RESPIRATION BLADDER Height & Weight     Self, Time, Situation, Place  Normal Continent Weight: 145 lb (65.8 kg) Height:  5' 5.5" (166.4 cm)  BEHAVIORAL SYMPTOMS/MOOD NEUROLOGICAL BOWEL NUTRITION STATUS      Continent Diet(please see dishcarge summary. )  AMBULATORY STATUS COMMUNICATION OF NEEDS Skin   Limited Assist   Normal                       Personal Care Assistance Level of Assistance  Bathing, Feeding, Dressing Bathing Assistance: Maximum assistance Feeding assistance: Limited assistance Dressing  Assistance: Maximum assistance     Functional Limitations Info  Sight, Hearing, Speech Sight Info: Adequate Hearing Info: Adequate Speech Info: Adequate    SPECIAL CARE FACTORS FREQUENCY  PT (By licensed PT), OT (By licensed OT)     PT Frequency: 5 times a week  OT Frequency: 5 times a week             Contractures Contractures Info: Not present    Additional Factors Info  Code Status, Allergies Code Status Info: DNR Allergies Info: Codeine, Hydrocodone, Celebrex Celecoxib           Current Medications (04/25/2017):  This is the current hospital active medication list Current Facility-Administered Medications  Medication Dose Route Frequency Provider Last Rate Last Dose  . 0.45 % sodium chloride infusion   Intravenous Continuous Waldemar Dickens, MD      . aspirin EC tablet 325 mg  325 mg Oral Daily Waldemar Dickens, MD      . Derrill Memo ON 04/26/2017] cefTRIAXone (ROCEPHIN) 1 g in dextrose 5 % 50 mL IVPB  1 g Intravenous Q24H Waldemar Dickens, MD      . donepezil (ARICEPT) tablet 5 mg  5 mg Oral QHS Waldemar Dickens, MD      . escitalopram (LEXAPRO) tablet 10 mg  10 mg Oral q morning - 10a Waldemar Dickens, MD      . naproxen (NAPROSYN) tablet 500 mg  500 mg Oral BID  PRN Waldemar Dickens, MD      . ondansetron [facility]) tablet 4 mg  4 mg Oral Q6H PRN Waldemar Dickens, MD       Or  . ondansetron [facility]) injection 4 mg  4 mg Intravenous Q6H PRN Waldemar Dickens, MD      . pantoprazole (PROTONIX) EC tablet 40 mg  40 mg Oral Daily Waldemar Dickens, MD      . potassium chloride SA (K-DUR,KLOR-CON) CR tablet 40 mEq  40 mEq Oral Once Duffy Bruce, MD      . Rivaroxaban [facility]) tablet 15 mg  15 mg Oral Daily Waldemar Dickens, MD      . tiZANidine (ZANAFLEX) capsule 4 mg  4 mg Oral TID PRN Waldemar Dickens, MD      . valsartan-hydrochlorothiazide (DIOVAN-HCT) 160-12.5 MG per tablet 1 tablet  1 tablet Oral q morning - 10a Waldemar Dickens, MD      . zolpidem [facility]) tablet 10  mg  10 mg Oral QHS PRN Waldemar Dickens, MD       Current Outpatient Medications  Medication Sig Dispense Refill  . BYSTOLIC 10 MG tablet Take 10 tablets daily by mouth.  3  . donepezil (ARICEPT) 10 MG tablet Take 10 mg daily by mouth.  4  . escitalopram (LEXAPRO) 10 MG tablet Take 10 mg by mouth every morning.     . latanoprost (XALATAN) 0.005 % ophthalmic solution Place 1 drop daily into both eyes.  5  . naproxen (NAPROSYN) 500 MG tablet Take 500 mg by mouth 2 (two) times daily with a meal.    . pantoprazole (PROTONIX) 40 MG tablet Take 40 mg by mouth daily.    . Rivaroxaban (XARELTO) 15 MG TABS tablet Take 15 mg by mouth daily.    . valsartan-hydrochlorothiazide (DIOVAN-HCT) 160-12.5 MG per tablet Take 1 tablet by mouth every morning.     . zolpidem (AMBIEN) 10 MG tablet Take 10 mg by mouth at bedtime as needed for sleep.    Marland Kitchen aspirin EC 325 MG tablet Take 1 tablet (325 mg total) by mouth daily. (Patient not taking: Reported on 04/25/2017) 30 tablet 0  . docusate sodium 100 MG CAPS Take 100 mg by mouth 2 (two) times daily. (Patient not taking: Reported on 05/25/2015) 10 capsule 0  . feeding supplement, ENSURE COMPLETE, (ENSURE COMPLETE) LIQD Take 237 mLs by mouth 2 (two) times daily between meals. (Patient not taking: Reported on 05/25/2015)    . ferrous sulfate 325 (65 FE) MG tablet Take 1 tablet (325 mg total) by mouth 2 (two) times daily with a meal. (Patient not taking: Reported on 05/25/2015)  3  . levofloxacin (LEVAQUIN) 500 MG tablet Take 500 mg by mouth daily. For 7 Days.  0  . polyethylene glycol (MIRALAX / GLYCOLAX) packet Take 17 g by mouth 2 (two) times daily. (Patient not taking: Reported on 05/25/2015) 14 each 0  . tiZANidine (ZANAFLEX) 4 MG capsule Take 1 capsule (4 mg total) by mouth 3 (three) times daily as needed for muscle spasms. (Patient not taking: Reported on 05/25/2015) 30 capsule 0     Discharge Medications: Please see discharge summary for a list of discharge  medications.  Relevant Imaging Results:  Relevant Lab Results:   Additional Information SSN- [id number]  Wetzel Bjornstad, LCSWA

## 2017-04-25 NOTE — ED Notes (Signed)
Placed pt on purewick  

## 2017-04-25 NOTE — Clinical Social Work Note (Signed)
Clinical Social Work Assessment  Patient Details  Name: Martha Hendrix MRN: [id number] Date of Birth: 1926/12/02  Date of referral:  04/25/17               Reason for consult:  Facility Placement                Permission sought to share information with:  Case Manager Permission granted to share information::  Yes, Verbal Permission Granted  Name::     Lynnea Ferrier   Agency::     Relationship::     Contact Information:     Housing/Transportation Living arrangements for the past 2 months:  Independent Living Facility([facility] ) Source of Information:  Patient Patient Interpreter Needed:  None Criminal Activity/Legal Involvement Pertinent to Current Situation/Hospitalization:  No - Comment as needed Significant Relationships:  Adult Children, Other Family Members Lives with:  Facility Resident, Self Do you feel safe going back to the place where you live?  Yes Need for family participation in patient care:  Yes (Comment)  Care giving concerns:  CSW spoke with pt at bedside. At this time pt has not expressed any concerns to CSW.    Social Worker assessment / plan:  CSW spoke with pt at bedside. During this time CSW was informed that pt is from [facility]. Pt informed CSW that she has been at this facility for two years now and plans to return to the facility at the time of discharge. Pt expresses that if SNF is needed at the time of discharge, then opt would try and work something out with [facility] so that pt can go back to the facility and still have pt's need met.   Employment status:  Retired Forensic scientist:  Medicare PT Recommendations:  Not assessed at this time [place] / Referral to community resources:     Patient/Family's Response to care:  Pt appeared to be understanding and agreeable to plan of care at this time   Patient/Family's Understanding of and Emotional Response to Diagnosis, Current Treatment, and Prognosis:  No further questions or  concerns have been presented to CSW at this time.   Emotional Assessment Appearance:  Appears stated age Attitude/Demeanor/Rapport:    Affect (typically observed):  Hopeful, Pleasant Orientation:  Oriented to Self, Oriented to Place, Oriented to  Time, Oriented to Situation Alcohol / Substance use:  Not Applicable Psych involvement (Current and /or in the community):  No (Comment)  Discharge Needs  Concerns to be addressed:  No discharge needs identified Readmission within the last 30 days:  No Current discharge risk:  None Barriers to Discharge:  No Barriers Identified   Wetzel Bjornstad, [place] 04/25/2017, 11:57 AM

## 2017-04-25 NOTE — ED Notes (Signed)
Assisted pt to restroom.  Pt ambulates with one assist

## 2017-04-26 ENCOUNTER — Other Ambulatory Visit: Payer: Self-pay

## 2017-04-26 DIAGNOSIS — N12 Tubulo-interstitial nephritis, not specified as acute or chronic: Secondary | ICD-10-CM | POA: Diagnosis not present

## 2017-04-26 DIAGNOSIS — I480 Paroxysmal atrial fibrillation: Secondary | ICD-10-CM | POA: Diagnosis not present

## 2017-04-26 DIAGNOSIS — E876 Hypokalemia: Secondary | ICD-10-CM | POA: Diagnosis not present

## 2017-04-26 DIAGNOSIS — I10 Essential (primary) hypertension: Secondary | ICD-10-CM

## 2017-04-26 DIAGNOSIS — F0390 Unspecified dementia without behavioral disturbance: Secondary | ICD-10-CM | POA: Diagnosis not present

## 2017-04-26 DIAGNOSIS — I4891 Unspecified atrial fibrillation: Secondary | ICD-10-CM

## 2017-04-26 LAB — BASIC METABOLIC PANEL
ANION GAP: 10 (ref 5–15)
BUN: 20 mg/dL (ref 6–20)
CALCIUM: 8.7 mg/dL — AB (ref 8.9–10.3)
CO2: 23 mmol/L (ref 22–32)
Chloride: 101 mmol/L (ref 101–111)
Creatinine, Ser: 1.15 mg/dL — ABNORMAL HIGH (ref 0.44–1.00)
GFR, EST AFRICAN AMERICAN: 48 mL/min — AB (ref >60)
GFR, EST NON AFRICAN AMERICAN: 41 mL/min — AB (ref >60)
Glucose, Bld: 113 mg/dL — ABNORMAL HIGH (ref 65–99)
POTASSIUM: 4 mmol/L (ref 3.5–5.1)
Sodium: 134 mmol/L — ABNORMAL LOW (ref 135–145)

## 2017-04-26 LAB — CBC
HEMATOCRIT: 34.2 % — AB (ref 36.0–46.0)
Hemoglobin: 10.7 g/dL — ABNORMAL LOW (ref 12.0–15.0)
MCH: 27.4 pg (ref 26.0–34.0)
MCHC: 31.3 g/dL (ref 30.0–36.0)
MCV: 87.7 fL (ref 78.0–100.0)
PLATELETS: 480 10*3/uL — AB (ref 150–400)
RBC: 3.9 MIL/uL (ref 3.87–5.11)
RDW: 15.2 % (ref 11.5–15.5)
WBC: 13.7 10*3/uL — AB (ref 4.0–10.5)

## 2017-04-26 LAB — MRSA PCR SCREENING: MRSA by PCR: NEGATIVE

## 2017-04-26 LAB — URINE CULTURE: Special Requests: NORMAL

## 2017-04-26 MED ORDER — NEBIVOLOL HCL 10 MG PO TABS
10.0000 mg | ORAL_TABLET | Freq: Every day | ORAL | Status: DC
  Administered 2017-04-26 – 2017-04-30 (×5): 10 mg via ORAL
  Filled 2017-04-26 (×5): qty 1

## 2017-04-26 MED ORDER — HYDRALAZINE HCL 20 MG/ML IJ SOLN: 5.0000 mg | Freq: Four times a day (QID) | INTRAMUSCULAR | Status: DC | PRN

## 2017-04-26 NOTE — Progress Notes (Signed)
MD made aware of the elevated BP, awaiting for response.

## 2017-04-26 NOTE — Progress Notes (Signed)
PROGRESS NOTE    Martha Hendrix  KYH:[id number] DOB: 24-Jun-1926 DOA: 04/25/2017 PCP: Burnard Bunting, MD   Brief Narrative: Martha Hendrix is a 83 y.o. female with a history of spinal stenosis, insomnia, hypertension, headaches, GERD, esophageal stricture, depression, dementia, atrial fibrillation, anemia. She presented with generalized weakness and dyspnea in addition to CVA tenderness. Concern for early pyelonephritis and she was started on Ceftriaxone empirically. Urine cultures significant for multiple species. She developed RVR of her atrial fibrillation likely secondary to not being on her home Bystolic.    Assessment & Plan:   Active Problems:   GERD   Hypertension   Pyelonephritis   Dementia   PAF (paroxysmal atrial fibrillation) (HCC)   Hypokalemia   Pyelonephritis Clinical symptoms of left flank pain. Urinalysis was contaminated. Urine cultures significant for multiple species. -Continue ceftriaxone  Hypokalemia -supplementation  Atrial fibrillation with RVR Likely secondary to not receiving Bystolic -Restart home Bystolic -Continue Xarelto -start Telemetry  Essential hypertension -Continue valsartan and hydrochlorothiazide -restart home Bystolic -hydralazine prn  Dementia Baseline -continue Aricept  Elevated alkaline phosphatase No abdominal pain -repeat CMP, if elevated, RUQ ultrasound   DVT prophylaxis: Xarelto Code Status: DNR Family Communication: Daughter at bedside Disposition Plan: Discharge to SNF when medically stable.   Consultants:   None  Procedures:   None  Antimicrobials:  Ceftriaxone    Subjective: No CVA tenderness. No dysuria. No chest pain or palpitations.  Objective: Vitals:   04/25/17 2004 04/26/17 0416 04/26/17 1047 04/26/17 1425  BP: (!) 146/69 (!) 168/93 (!) 169/95 (!) 160/100  Pulse: (!) 110 96 (!) 120 100  Resp: 20 20 18 18   Temp: 98.5 F (36.9 C) 98.4 F (36.9 C) 99 F (37.2 C) 98.5 F  (36.9 C)  TempSrc: Oral Oral Oral Oral  SpO2: 94% 94% 94% 93%  Weight:      Height:        Intake/Output Summary (Last 24 hours) at 04/26/2017 1537 Last data filed at 04/26/2017 1425 Gross per 24 hour  Intake 1755 ml  Output -  Net 1755 ml   Filed Weights   04/25/17 0702  Weight: 65.8 kg (145 lb)    Examination:  General exam: Appears calm and comfortable Respiratory system: Clear to auscultation. Respiratory effort normal. Cardiovascular system: S1 & S2 heard, irregular rhythm with fast rate. No murmurs, rubs, gallops or clicks. Gastrointestinal system: Abdomen is nondistended, soft and nontender. No organomegaly or masses felt. Normal bowel sounds heard. Central nervous system: Alert and oriented to person. No focal neurological deficits. Extremities: No edema. No calf tenderness Skin: No cyanosis. No rashes Psychiatry: Judgement and insight appear normal. Mood & affect appropriate.     Data Reviewed: I have personally reviewed following labs and imaging studies  CBC: Recent Labs  Lab 04/25/17 0720 04/26/17 0521  WBC 12.0* 13.7*  NEUTROABS 8.2*  --   HGB 10.6* 10.7*  HCT 32.9* 34.2*  MCV 88.0 87.7  PLT 475* 151*   Basic Metabolic Panel: Recent Labs  Lab 04/25/17 0720 04/26/17 0521  NA 133* 134*  K 3.3* 4.0  CL 98* 101  CO2 24 23  GLUCOSE 98 113*  BUN 24* 20  CREATININE 1.29* 1.15*  CALCIUM 8.6* 8.7*   GFR: Estimated Creatinine Clearance: 31.1 mL/min (A) (by C-G formula based on SCr of 1.15 mg/dL (H)). Liver Function Tests: Recent Labs  Lab 04/25/17 0720  AST 18  ALT 15  ALKPHOS 211*  BILITOT 1.4*  PROT 6.6  ALBUMIN 2.6*  No results for input(s): LIPASE, AMYLASE in the last 168 hours. No results for input(s): AMMONIA in the last 168 hours. Coagulation Profile: No results for input(s): INR, PROTIME in the last 168 hours. Cardiac Enzymes: No results for input(s): CKTOTAL, CKMB, CKMBINDEX, TROPONINI in the last 168 hours. BNP (last 3  results) No results for input(s): PROBNP in the last 8760 hours. HbA1C: No results for input(s): HGBA1C in the last 72 hours. CBG: No results for input(s): GLUCAP in the last 168 hours. Lipid Profile: No results for input(s): CHOL, HDL, LDLCALC, TRIG, CHOLHDL, LDLDIRECT in the last 72 hours. Thyroid Function Tests: No results for input(s): TSH, T4TOTAL, FREET4, T3FREE, THYROIDAB in the last 72 hours. Anemia Panel: No results for input(s): VITAMINB12, FOLATE, FERRITIN, TIBC, IRON, RETICCTPCT in the last 72 hours. Sepsis Labs: Recent Labs  Lab 04/25/17 8676  LATICACIDVEN 1.49    Recent Results (from the past 240 hour(s))  Urine culture     Status: Abnormal   Collection Time: 04/25/17  8:30 AM  Result Value Ref Range Status   Specimen Description URINE, CLEAN CATCH  Final   Special Requests Normal  Final   Culture MULTIPLE SPECIES PRESENT, SUGGEST RECOLLECTION (A)  Final   Report Status 04/26/2017 FINAL  Final  MRSA PCR Screening     Status: None   Collection Time: 04/26/17  1:52 AM  Result Value Ref Range Status   MRSA by PCR NEGATIVE NEGATIVE Final    Comment:        The GeneXpert MRSA Assay (FDA approved for NASAL specimens only), is one component of a comprehensive MRSA colonization surveillance program. It is not intended to diagnose MRSA infection nor to guide or monitor treatment for MRSA infections.          Radiology Studies: Dg Chest 2 View  Result Date: 04/25/2017 CLINICAL DATA:  Shortness of breath.  Left flank pain. EXAM: CHEST  2 VIEW COMPARISON:  08/02/2016 . FINDINGS: Mediastinum and hilar structures normal. Cardiomegaly with normal pulmonary vascularity. Mild basilar atelectasis and/or scarring. Tiny left pleural effusion cannot be excluded. No pneumothorax. Diffuse thoracic spine osteopenia degenerative change . IMPRESSION: Mild basilar atelectasis and/or scarring . Tiny left pleural effusion cannot be excluded. No pneumothorax. Electronically Signed    By: Marcello Moores  Register   On: 04/25/2017 07:55        Scheduled Meds: . donepezil  5 mg Oral QHS  . escitalopram  10 mg Oral q morning - 10a  . irbesartan  150 mg Oral Daily   And  . hydrochlorothiazide  12.5 mg Oral Daily  . nebivolol  10 mg Oral Daily  . pantoprazole  40 mg Oral Daily  . Rivaroxaban  15 mg Oral Q breakfast   Continuous Infusions: . sodium chloride 75 mL/hr at 04/26/17 0419  . cefTRIAXone (ROCEPHIN)  IV Stopped (04/26/17 1015)     LOS: 0 days     Cordelia Poche, MD [facility] 04/26/2017, 3:37 PM Pager: [phone]  If 7PM-7AM, please contact night-coverage [email or web address] Password [facility] 04/26/2017, 3:37 PM

## 2017-04-26 NOTE — Progress Notes (Signed)
OT Cancellation Note  Patient Details Name: Martha Hendrix MRN: [id number] DOB: 15-Sep-1926   Cancelled Treatment:    Reason Eval/Treat Not Completed: Fatigue/lethargy limiting ability to participate(pt just returned to bed.) Will return.  Malka So 04/26/2017, 10:49 AM  [phone]

## 2017-04-26 NOTE — Social Work (Signed)
CSW called Butch Penny in admission at [facility] to confirm that ALF can provide the short term rehab for patient per previous CSW note.  CSW left message for Butch Penny in admissions. CSW will f/u.  Elissa Hefty, LCSW Clinical Social Worker [phone]

## 2017-04-26 NOTE — Progress Notes (Signed)
Physical Therapy Treatment Patient Details Name: Martha Hendrix MRN: [id number] DOB: 1926/10/23 Today's Date: 04/26/2017    History of Present Illness Martha Hendrix a 83 y.o.femalewith medical history significant ofspinal stenosis, insomnia, hypertension, headaches, GERD, esophageal stricture, depression, dementia, atrial fibrillation, anemia. Symptoms started 24 hours prior to admission with a complaint of generalized weakness and shortness of breath.     PT Comments    Patient was able to ambulate farther this session however continues to demonstrate limited mobility due to 2-3/4 DOE and HR range 66-144 bpm. Pt required mod A for functional transfers and min A for safe ambulation with RW. Daughter present during session. Current plan remains appropriate.    Follow Up Recommendations  SNF;Supervision/Assistance - 24 hour     Equipment Recommendations  None recommended by PT    Recommendations for Other Services OT consult     Precautions / Restrictions Precautions Precautions: Fall Precaution Comments: watch HR    Mobility  Bed Mobility Overal bed mobility: Needs Assistance Bed Mobility: Supine to Sit     Supine to sit: Min assist     General bed mobility comments: assist to elevate trunk into sitting; cues for sequencing   Transfers Overall transfer level: Needs assistance Equipment used: Rolling walker (2 wheeled) Transfers: Sit to/from Stand Sit to Stand: Mod assist         General transfer comment: cues for safe hand placement and technique with pt using momentum 2nd trial; assist to power up into standing and to steady upon standing  Ambulation/Gait Ambulation/Gait assistance: Min assist;+2 safety/equipment(chair follow) Ambulation Distance (Feet): (38ft X2) Assistive device: Rolling walker (2 wheeled) Gait Pattern/deviations: Step-through pattern;Trunk flexed;Decreased step length - right;Decreased step length - left;Decreased dorsiflexion  - right;Decreased dorsiflexion - left Gait velocity: decreased   General Gait Details: cues for posture, breathing technique, and safe use of AD; assist for balance and managing RW especially when turning; pt with SpO2 down to 89% on RA and HR ranging from 66 bpm-144 bpm; pt with 2-3/4 DOE   Stairs            Wheelchair Mobility    Modified Rankin (Stroke Patients Only)       Balance Overall balance assessment: Needs assistance Sitting-balance support: Bilateral upper extremity supported Sitting balance-Leahy Scale: Fair     Standing balance support: Bilateral upper extremity supported Standing balance-Leahy Scale: Poor Standing balance comment: reliant on UE support                            Cognition Arousal/Alertness: Awake/alert Behavior During Therapy: WFL for tasks assessed/performed Overall Cognitive Status: History of cognitive impairments - at baseline                                        Exercises      General Comments General comments (skin integrity, edema, etc.): daughter present      Pertinent Vitals/Pain Pain Assessment: No/denies pain    Home Living                      Prior Function            PT Goals (current goals can now be found in the care plan section) Acute Rehab PT Goals PT Goal Formulation: With patient Time For Goal Achievement: 05/09/17 Potential to Achieve Goals: Good  Progress towards PT goals: Progressing toward goals    Frequency    Min 3X/week      PT Plan Current plan remains appropriate    Co-evaluation              AM-PAC PT "6 Clicks" Daily Activity  Outcome Measure  Difficulty turning over in bed (including adjusting bedclothes, sheets and blankets)?: Unable Difficulty moving from lying on back to sitting on the side of the bed? : Unable Difficulty sitting down on and standing up from a chair with arms (e.g., wheelchair, bedside commode, etc,.)?:  Unable Help needed moving to and from a bed to chair (including a wheelchair)?: A Lot Help needed walking in hospital room?: A Little Help needed climbing 3-5 steps with a railing? : A Lot 6 Click Score: 10    End of Session Equipment Utilized During Treatment: Gait belt Activity Tolerance: Other (comment)(limited by HR and DOE) Patient left: with call bell/phone within reach;in chair;with family/visitor present;Other (comment)(MD present) Nurse Communication: Mobility status PT Visit Diagnosis: Unsteadiness on feet (R26.81);Muscle weakness (generalized) (M62.81);Difficulty in walking, not elsewhere classified (R26.2)     Time: 2010-0712 PT Time Calculation (min) (ACUTE ONLY): 20 min  Charges:  $Gait Training: 8-22 mins                    G Codes:       Martha Hendrix, Martha Hendrix     Martha Hendrix 04/26/2017, 2:06 PM

## 2017-04-27 ENCOUNTER — Observation Stay (HOSPITAL_COMMUNITY): Payer: Medicare Other

## 2017-04-27 DIAGNOSIS — R748 Abnormal levels of other serum enzymes: Secondary | ICD-10-CM

## 2017-04-27 DIAGNOSIS — Z96641 Presence of right artificial hip joint: Secondary | ICD-10-CM | POA: Diagnosis present

## 2017-04-27 DIAGNOSIS — N12 Tubulo-interstitial nephritis, not specified as acute or chronic: Secondary | ICD-10-CM | POA: Diagnosis not present

## 2017-04-27 DIAGNOSIS — F0390 Unspecified dementia without behavioral disturbance: Secondary | ICD-10-CM | POA: Diagnosis not present

## 2017-04-27 DIAGNOSIS — Z66 Do not resuscitate: Secondary | ICD-10-CM | POA: Diagnosis present

## 2017-04-27 DIAGNOSIS — E876 Hypokalemia: Secondary | ICD-10-CM | POA: Diagnosis not present

## 2017-04-27 DIAGNOSIS — Z7901 Long term (current) use of anticoagulants: Secondary | ICD-10-CM | POA: Diagnosis not present

## 2017-04-27 DIAGNOSIS — R918 Other nonspecific abnormal finding of lung field: Secondary | ICD-10-CM | POA: Diagnosis not present

## 2017-04-27 DIAGNOSIS — Z9842 Cataract extraction status, left eye: Secondary | ICD-10-CM | POA: Diagnosis not present

## 2017-04-27 DIAGNOSIS — A419 Sepsis, unspecified organism: Secondary | ICD-10-CM | POA: Diagnosis not present

## 2017-04-27 DIAGNOSIS — G4700 Insomnia, unspecified: Secondary | ICD-10-CM | POA: Diagnosis present

## 2017-04-27 DIAGNOSIS — J90 Pleural effusion, not elsewhere classified: Secondary | ICD-10-CM | POA: Diagnosis not present

## 2017-04-27 DIAGNOSIS — N281 Cyst of kidney, acquired: Secondary | ICD-10-CM | POA: Diagnosis present

## 2017-04-27 DIAGNOSIS — M1612 Unilateral primary osteoarthritis, left hip: Secondary | ICD-10-CM | POA: Diagnosis present

## 2017-04-27 DIAGNOSIS — N2889 Other specified disorders of kidney and ureter: Secondary | ICD-10-CM | POA: Diagnosis not present

## 2017-04-27 DIAGNOSIS — R531 Weakness: Secondary | ICD-10-CM | POA: Diagnosis not present

## 2017-04-27 DIAGNOSIS — Z7982 Long term (current) use of aspirin: Secondary | ICD-10-CM | POA: Diagnosis not present

## 2017-04-27 DIAGNOSIS — M48061 Spinal stenosis, lumbar region without neurogenic claudication: Secondary | ICD-10-CM | POA: Diagnosis present

## 2017-04-27 DIAGNOSIS — M810 Age-related osteoporosis without current pathological fracture: Secondary | ICD-10-CM | POA: Diagnosis present

## 2017-04-27 DIAGNOSIS — R609 Edema, unspecified: Secondary | ICD-10-CM | POA: Diagnosis not present

## 2017-04-27 DIAGNOSIS — R7989 Other specified abnormal findings of blood chemistry: Secondary | ICD-10-CM | POA: Diagnosis not present

## 2017-04-27 DIAGNOSIS — H409 Unspecified glaucoma: Secondary | ICD-10-CM | POA: Diagnosis present

## 2017-04-27 DIAGNOSIS — R51 Headache: Secondary | ICD-10-CM | POA: Diagnosis not present

## 2017-04-27 DIAGNOSIS — Z888 Allergy status to other drugs, medicaments and biological substances status: Secondary | ICD-10-CM | POA: Diagnosis not present

## 2017-04-27 DIAGNOSIS — I10 Essential (primary) hypertension: Secondary | ICD-10-CM | POA: Diagnosis not present

## 2017-04-27 DIAGNOSIS — Z885 Allergy status to narcotic agent status: Secondary | ICD-10-CM | POA: Diagnosis not present

## 2017-04-27 DIAGNOSIS — M4800 Spinal stenosis, site unspecified: Secondary | ICD-10-CM | POA: Diagnosis not present

## 2017-04-27 DIAGNOSIS — K219 Gastro-esophageal reflux disease without esophagitis: Secondary | ICD-10-CM | POA: Diagnosis present

## 2017-04-27 DIAGNOSIS — Z79899 Other long term (current) drug therapy: Secondary | ICD-10-CM | POA: Diagnosis not present

## 2017-04-27 DIAGNOSIS — I480 Paroxysmal atrial fibrillation: Secondary | ICD-10-CM | POA: Diagnosis not present

## 2017-04-27 DIAGNOSIS — F329 Major depressive disorder, single episode, unspecified: Secondary | ICD-10-CM | POA: Diagnosis present

## 2017-04-27 LAB — CBC
HEMATOCRIT: 33.4 % — AB (ref 36.0–46.0)
Hemoglobin: 10.3 g/dL — ABNORMAL LOW (ref 12.0–15.0)
MCH: 27 pg (ref 26.0–34.0)
MCHC: 30.8 g/dL (ref 30.0–36.0)
MCV: 87.4 fL (ref 78.0–100.0)
PLATELETS: 503 10*3/uL — AB (ref 150–400)
RBC: 3.82 MIL/uL — ABNORMAL LOW (ref 3.87–5.11)
RDW: 15.2 % (ref 11.5–15.5)
WBC: 19.8 10*3/uL — ABNORMAL HIGH (ref 4.0–10.5)

## 2017-04-27 LAB — COMPREHENSIVE METABOLIC PANEL
ALBUMIN: 2.2 g/dL — AB (ref 3.5–5.0)
ALT: 12 U/L — AB (ref 14–54)
AST: 18 U/L (ref 15–41)
Alkaline Phosphatase: 214 U/L — ABNORMAL HIGH (ref 38–126)
Anion gap: 8 (ref 5–15)
BILIRUBIN TOTAL: 1 mg/dL (ref 0.3–1.2)
BUN: 21 mg/dL — AB (ref 6–20)
CHLORIDE: 99 mmol/L — AB (ref 101–111)
CO2: 26 mmol/L (ref 22–32)
CREATININE: 1.17 mg/dL — AB (ref 0.44–1.00)
Calcium: 8.4 mg/dL — ABNORMAL LOW (ref 8.9–10.3)
GFR calc Af Amer: 47 mL/min — ABNORMAL LOW (ref >60)
GFR calc non Af Amer: 40 mL/min — ABNORMAL LOW (ref >60)
GLUCOSE: 112 mg/dL — AB (ref 65–99)
Potassium: 3.3 mmol/L — ABNORMAL LOW (ref 3.5–5.1)
Sodium: 133 mmol/L — ABNORMAL LOW (ref 135–145)
TOTAL PROTEIN: 6 g/dL — AB (ref 6.5–8.1)

## 2017-04-27 LAB — PROCALCITONIN: PROCALCITONIN: 0.26 ng/mL

## 2017-04-27 MED ORDER — VANCOMYCIN HCL IN DEXTROSE 750-5 MG/150ML-% IV SOLN
750.0000 mg | INTRAVENOUS | Status: DC
  Administered 2017-04-28 – 2017-04-29 (×2): 750 mg via INTRAVENOUS
  Filled 2017-04-27 (×2): qty 150

## 2017-04-27 MED ORDER — ACETAMINOPHEN 325 MG PO TABS
650.0000 mg | ORAL_TABLET | Freq: Four times a day (QID) | ORAL | Status: DC | PRN
  Administered 2017-04-27: 650 mg via ORAL
  Filled 2017-04-27 (×2): qty 2

## 2017-04-27 MED ORDER — VANCOMYCIN HCL IN DEXTROSE 1-5 GM/200ML-% IV SOLN
1000.0000 mg | Freq: Once | INTRAVENOUS | Status: AC
  Administered 2017-04-27: 1000 mg via INTRAVENOUS
  Filled 2017-04-27: qty 200

## 2017-04-27 MED ORDER — DEXTROSE 5 % IV SOLN
1.0000 g | INTRAVENOUS | Status: DC
  Administered 2017-04-27 – 2017-04-29 (×3): 1 g via INTRAVENOUS
  Filled 2017-04-27 (×4): qty 1

## 2017-04-27 MED ORDER — LATANOPROST 0.005 % OP SOLN
1.0000 [drp] | Freq: Every morning | OPHTHALMIC | Status: DC
  Administered 2017-04-27 – 2017-04-30 (×4): 1 [drp] via OPHTHALMIC
  Filled 2017-04-27: qty 2.5

## 2017-04-27 NOTE — Progress Notes (Signed)
Physical Therapy Treatment Patient Details Name: Martha Hendrix MRN: [id number] DOB: 09-01-1926 Today's Date: 04/27/2017    History of Present Illness Korissa Horsford Archibaldis a 83 y.o.femalewith medical history significant ofspinal stenosis, insomnia, hypertension, headaches, GERD, esophageal stricture, depression, dementia, atrial fibrillation, anemia. Symptoms started 24 hours prior to admission with a complaint of generalized weakness and shortness of breath. Chest X-ray on 11/15 revealed small L effusion without edema and mild basilar atelectasis.     PT Comments    Pt with slow progression towards goals. Pt with increased SOB during gait and not able to tolerate as long of a distance this session before requiring seated rest. Pt reports she did walk with mobility tech earlier that morning which may have contributed to fatigue. DOE at 3/4 and oxygen sats at 90% on RA throughout.  Required mod A to stand and  Min A for steadying during gait. Daughter present during session and very eager to get pt to SNF. Current recommendations remain appropriate. Will continue to follow acutely to maximize functional mobility independence and safety.   Follow Up Recommendations  SNF;Supervision/Assistance - 24 hour     Equipment Recommendations  None recommended by PT    Recommendations for Other Services OT consult     Precautions / Restrictions Precautions Precautions: Fall Precaution Comments: watch HR Restrictions Weight Bearing Restrictions: No    Mobility  Bed Mobility Overal bed mobility: Needs Assistance Bed Mobility: Sit to Supine       Sit to supine: Min assist   General bed mobility comments: Min A for LE lift assist back to bed.   Transfers Overall transfer level: Needs assistance Equipment used: Rolling walker (2 wheeled) Transfers: Sit to/from Stand Sit to Stand: Mod assist         General transfer comment: Mod A for lift assist and steadying throughout  transfers. Performed X 3 throughout session. Required cues for safe hand placement throughout.   Ambulation/Gait Ambulation/Gait assistance: Min assist;+2 safety/equipment Ambulation Distance (Feet): 50 Feet(X2) Assistive device: Rolling walker (2 wheeled) Gait Pattern/deviations: Step-to pattern;Step-through pattern;Decreased step length - left;Decreased step length - right;Decreased dorsiflexion - right;Decreased dorsiflexion - left Gait velocity: Decreased Gait velocity interpretation: Below normal speed for age/gender General Gait Details: Min A for steadying assist and required cues for appropriate use of RW. Required cues for proximity to device. Pt demonstrating step to to step through gait. Sometimes would drag RLE. Oxygen sats at 90% on RA, and HR increased to 122 during gait. Required seated rest between gait trials secondary to SOB and fatigue. DOE at 3/4 during gait.    Stairs            Wheelchair Mobility    Modified Rankin (Stroke Patients Only)       Balance Overall balance assessment: Needs assistance Sitting-balance support: Bilateral upper extremity supported Sitting balance-Leahy Scale: Fair     Standing balance support: Bilateral upper extremity supported Standing balance-Leahy Scale: Poor Standing balance comment: reliant on UE support                            Cognition Arousal/Alertness: Awake/alert Behavior During Therapy: WFL for tasks assessed/performed Overall Cognitive Status: History of cognitive impairments - at baseline                                        Exercises  General Comments General comments (skin integrity, edema, etc.): Daughter present during session.       Pertinent Vitals/Pain Pain Assessment: No/denies pain    Home Living                      Prior Function            PT Goals (current goals can now be found in the care plan section) Acute Rehab PT Goals Patient  Stated Goal: wants to go back to her apt PT Goal Formulation: With patient Time For Goal Achievement: 05/09/17 Potential to Achieve Goals: Good Progress towards PT goals: Progressing toward goals(slowly )    Frequency    Min 3X/week      PT Plan Current plan remains appropriate    Co-evaluation              AM-PAC PT "6 Clicks" Daily Activity  Outcome Measure  Difficulty turning over in bed (including adjusting bedclothes, sheets and blankets)?: Unable Difficulty moving from lying on back to sitting on the side of the bed? : Unable Difficulty sitting down on and standing up from a chair with arms (e.g., wheelchair, bedside commode, etc,.)?: Unable Help needed moving to and from a bed to chair (including a wheelchair)?: A Lot Help needed walking in hospital room?: A Little Help needed climbing 3-5 steps with a railing? : A Lot 6 Click Score: 10    End of Session Equipment Utilized During Treatment: Gait belt Activity Tolerance: Treatment limited secondary to medical complications (Comment)(DOE) Patient left: in bed;with call bell/phone within reach;with family/visitor present Nurse Communication: Mobility status PT Visit Diagnosis: Unsteadiness on feet (R26.81);Muscle weakness (generalized) (M62.81);Difficulty in walking, not elsewhere classified (R26.2)     Time: 1110-1135 PT Time Calculation (min) (ACUTE ONLY): 25 min  Charges:  $Gait Training: 23-37 mins                    G Codes:  Functional Assessment Tool Used: AM-PAC 6 Clicks Basic Mobility Functional Limitation: Mobility: Walking and moving around Mobility: Walking and Moving Around Current Status (R4431): At least 60 percent but less than 80 percent impaired, limited or restricted Mobility: Walking and Moving Around Goal Status [id number]): At least 20 percent but less than 40 percent impaired, limited or restricted    Leighton Ruff, PT, DPT  Acute Rehabilitation Services  Pager:  [place] 04/27/2017, 12:24 PM

## 2017-04-27 NOTE — Progress Notes (Signed)
Pharmacy Antibiotic Note  Martha Hendrix is a 83 y.o. female admitted on 04/25/2017 with urosepsis.  Pharmacy has been consulted for vancomycin and cefepime dosing. SCr 1.17 and CrCl 30.44ml/min.   Vancomycin trough goal 15-20  Plan: 1) Vancomycin 1g IV x 1 then 750mg  IV q24 2) Cefepime 1g IV q24 3) Follow renal function, cultures, LOT, level if needed  Height: 5' 5.5" (166.4 cm) Weight: 145 lb (65.8 kg) IBW/kg (Calculated) : 58.15  Temp (24hrs), Avg:99.1 F (37.3 C), Min:97.6 F (36.4 C), Max:102.1 F (38.9 C)  Recent Labs  Lab 04/25/17 0720 04/25/17 0929 04/26/17 0521 04/27/17 0528 04/27/17 1134  WBC 12.0*  --  13.7*  --  19.8*  CREATININE 1.29*  --  1.15* 1.17*  --   LATICACIDVEN  --  1.49  --   --   --     Estimated Creatinine Clearance: 30.5 mL/min (A) (by C-G formula based on SCr of 1.17 mg/dL (H)).    Allergies  Allergen Reactions  . Codeine Nausea And Vomiting and Other (See Comments)    "spacy feeling"  . Hydrocodone Nausea And Vomiting    spacey feeling   . Celebrex [Celecoxib]     Thank you for allowing pharmacy to be a part of this patient's care.  Deboraha Sprang 04/27/2017 9:48 PM

## 2017-04-27 NOTE — Evaluation (Signed)
Occupational Therapy Evaluation Patient Details Name: Martha Hendrix MRN: [id number] DOB: 02-18-1927 Today's Date: 04/27/2017    History of Present Illness Martha Pickart Archibaldis a 83 y.o.femalewith medical history significant ofspinal stenosis, insomnia, hypertension, headaches, GERD, esophageal stricture, depression, dementia, atrial fibrillation, anemia. Symptoms started 24 hours prior to admission with a complaint of generalized weakness and shortness of breath.    Clinical Impression   Pt uses a rollator out of her apartment, otherwise does not use a device to walk. She is assisted for IADL, but independent in ADL. Pt presents with baseline impairment in cognition, decreased activity tolerance and poor balance. She requires min assist for OOB mobility and min to moderate assist for ADL. Pt will need rehab upon d/c, per daughter plans to return to [facility] in the rehab facility. Will follow acutely.    Follow Up Recommendations  SNF;Supervision/Assistance - 24 hour    Equipment Recommendations       Recommendations for Other Services       Precautions / Restrictions Precautions Precautions: Fall Precaution Comments: watch HR Restrictions Weight Bearing Restrictions: No      Mobility Bed Mobility Overal bed mobility: Needs Assistance Bed Mobility: Supine to Sit     Supine to sit: Supervision     General bed mobility comments: no physical assist, HOB up  Transfers Overall transfer level: Needs assistance Equipment used: Rolling walker (2 wheeled) Transfers: Sit to/from Stand Sit to Stand: Min assist         General transfer comment: cues for safe hand placement and technique with pt using momentum 2nd trial; assist to power up into standing and to steady upon standing    Balance     Sitting balance-Leahy Scale: Fair Sitting balance - Comments: no LOB leaning over to don sock     Standing balance-Leahy Scale: Poor Standing balance comment: reliant  on UE support                           ADL either performed or assessed with clinical judgement   ADL Overall ADL's : Needs assistance/impaired Eating/Feeding: Set up;Sitting   Grooming: Wash/dry hands;Standing;Minimal assistance Grooming Details (indicate cue type and reason): cues to use soap and to sequence Upper Body Bathing: Minimal assistance;Sitting   Lower Body Bathing: Moderate assistance;Sit to/from stand   Upper Body Dressing : Minimal assistance;Sitting   Lower Body Dressing: Moderate assistance;Sit to/from stand   Toilet Transfer: Minimal assistance;Ambulation;RW;Comfort height toilet;Grab bars   Toileting- Clothing Manipulation and Hygiene: Minimal assistance;Sit to/from stand       Functional mobility during ADLs: Minimal assistance;Rolling walker;Cueing for safety       Vision Patient Visual Report: No change from baseline       Perception     Praxis      Pertinent Vitals/Pain Pain Assessment: No/denies pain     Hand Dominance Right   Extremity/Trunk Assessment Upper Extremity Assessment Upper Extremity Assessment: Generalized weakness   Lower Extremity Assessment Lower Extremity Assessment: Defer to PT evaluation   Cervical / Trunk Assessment Cervical / Trunk Assessment: Normal   Communication Communication Communication: No difficulties   Cognition Arousal/Alertness: Awake/alert Behavior During Therapy: WFL for tasks assessed/performed Overall Cognitive Status: History of cognitive impairments - at baseline                                 General Comments: pt disoriented to time  and situation, poor historian, daughter assisting with answering questions   General Comments       Exercises     Shoulder Instructions      Home Living Family/patient expects to be discharged to:: Skilled nursing facility                                 Additional Comments: pt from independent living at  [facility]      Prior Functioning/Environment Level of Independence: Needs assistance  Gait / Transfers Assistance Needed: walks with a rollator to dining room, otherwise does not use a device ADL's / Homemaking Assistance Needed: assisted for medications, meals and housekeeping, performs self care independently            OT Problem List: Impaired balance (sitting and/or standing);Decreased activity tolerance;Decreased cognition;Decreased safety awareness;Decreased knowledge of use of DME or AE;Decreased strength      OT Treatment/Interventions: Self-care/ADL training;DME and/or AE instruction;Therapeutic activities;Patient/family education;Balance training    OT Goals(Current goals can be found in the care plan section) Acute Rehab OT Goals Patient Stated Goal: wants to go back to her apt OT Goal Formulation: With patient Time For Goal Achievement: 05/11/17 Potential to Achieve Goals: Good ADL Goals Pt Will Perform Grooming: with supervision;standing Pt Will Perform Upper Body Dressing: with supervision;sitting Pt Will Perform Lower Body Dressing: with supervision;sit to/from stand Pt Will Transfer to Toilet: with supervision;ambulating;bedside commode Pt Will Perform Toileting - Clothing Manipulation and hygiene: with supervision;sit to/from stand  OT Frequency: Min 2X/week   Barriers to D/C:            Co-evaluation              AM-PAC PT "6 Clicks" Daily Activity     Outcome Measure Help from another person eating meals?: A Little Help from another person taking care of personal grooming?: A Little Help from another person toileting, which includes using toliet, bedpan, or urinal?: A Little Help from another person bathing (including washing, rinsing, drying)?: A Lot Help from another person to put on and taking off regular upper body clothing?: A Little Help from another person to put on and taking off regular lower body clothing?: A Lot 6 Click Score: 16    End of Session Equipment Utilized During Treatment: Gait belt;Rolling walker Nurse Communication: (daughter questioning eye drops)  Activity Tolerance: Patient limited by fatigue Patient left: in chair;with call bell/phone within reach;with family/visitor present  OT Visit Diagnosis: Unsteadiness on feet (R26.81)                Time: 4709-6283 OT Time Calculation (min): 20 min Charges:  OT General Charges $OT Visit: 1 Visit OT Evaluation $OT Eval Moderate Complexity: 1 Mod G-Codes:     05/06/2017 Nestor Lewandowsky, OTR/L Pager: [place], Haze [place] 05/06/17, 8:41 AM

## 2017-04-27 NOTE — Progress Notes (Signed)
PROGRESS NOTE    Martha Hendrix  HCW:[id number] DOB: 01-Jun-1927 DOA: 04/25/2017 PCP: Burnard Bunting, MD   Brief Narrative: Martha Hendrix is a 83 y.o. female with a history of spinal stenosis, insomnia, hypertension, headaches, GERD, esophageal stricture, depression, dementia, atrial fibrillation, anemia. She presented with generalized weakness and dyspnea in addition to CVA tenderness. Concern for early pyelonephritis and she was started on Ceftriaxone empirically. Urine cultures significant for multiple species. She developed RVR of her atrial fibrillation likely secondary to not being on her home Bystolic.    Assessment & Plan:   Active Problems:   GERD   Hypertension   Pyelonephritis   Dementia   PAF (paroxysmal atrial fibrillation) (HCC)   Hypokalemia   Pyelonephritis Clinical symptoms of left flank pain. Urinalysis was contaminated. Urine cultures significant for multiple species. Increasing WBC so may not be covering organism. No prior urine cultures with organisms available. -Continue ceftriaxone for now; may need to broaden empirically -repeat urine culture (clean catch)  Hypokalemia -supplementation  Atrial fibrillation with RVR Likely secondary to not receiving Bystolic -Restart home Bystolic -Continue Xarelto -start Telemetry  Essential hypertension -Continue valsartan and hydrochlorothiazide -Continue home Bystolic -hydralazine prn  Dementia Baseline -continue Aricept  Elevated alkaline phosphatase No abdominal pain, continues to be elevated -RUQ ultrasound to rule out cholecystitis in setting of leukocytosis   DVT prophylaxis: Xarelto Code Status: DNR Family Communication: Daughter in Sports coach by telephone Disposition Plan: Discharge to SNF when medically stable.   Consultants:   None  Procedures:   None  Antimicrobials:  Ceftriaxone    Subjective: No dysuria. No coughing. No dyspnea or chest pain.  Objective: Vitals:   04/26/17 1425 04/26/17 2219 04/27/17 0435 04/27/17 0917  BP: (!) 160/100 (!) 154/89 128/86   Pulse: 100 (!) 105 85   Resp: 18 17 20    Temp: 98.5 F (36.9 C) 98.3 F (36.8 C) 97.6 F (36.4 C) 99 F (37.2 C)  TempSrc: Oral Oral Oral   SpO2: 93% 95% 97%   Weight:      Height:        Intake/Output Summary (Last 24 hours) at 04/27/2017 1303 Last data filed at 04/27/2017 1254 Gross per 24 hour  Intake 1205 ml  Output -  Net 1205 ml   Filed Weights   04/25/17 0702  Weight: 65.8 kg (145 lb)    Examination:  General exam: Appears calm and comfortable Respiratory system: Clear to auscultation. Respiratory effort normal. Cardiovascular system: S1 & S2 heard, irregular rhythm with regular rate. No murmurs, rubs, gallops or clicks. Gastrointestinal system: Abdomen is nondistended, soft and nontender. Normal bowel sounds heard. Central nervous system: Alert and oriented to person. No focal neurological deficits. Extremities: No edema. No calf tenderness Skin: No cyanosis. No rashes Psychiatry: Judgement and insight appear poor. Mood & affect appropriate.     Data Reviewed: I have personally reviewed following labs and imaging studies  CBC: Recent Labs  Lab 04/25/17 0720 04/26/17 0521 04/27/17 1134  WBC 12.0* 13.7* 19.8*  NEUTROABS 8.2*  --   --   HGB 10.6* 10.7* 10.3*  HCT 32.9* 34.2* 33.4*  MCV 88.0 87.7 87.4  PLT 475* 480* 176*   Basic Metabolic Panel: Recent Labs  Lab 04/25/17 0720 04/26/17 0521 04/27/17 0528  NA 133* 134* 133*  K 3.3* 4.0 3.3*  CL 98* 101 99*  CO2 24 23 26   GLUCOSE 98 113* 112*  BUN 24* 20 21*  CREATININE 1.29* 1.15* 1.17*  CALCIUM 8.6* 8.7* 8.4*  GFR: Estimated Creatinine Clearance: 30.5 mL/min (A) (by C-G formula based on SCr of 1.17 mg/dL (H)). Liver Function Tests: Recent Labs  Lab 04/25/17 0720 04/27/17 0528  AST 18 18  ALT 15 12*  ALKPHOS 211* 214*  BILITOT 1.4* 1.0  PROT 6.6 6.0*  ALBUMIN 2.6* 2.2*   No results for  input(s): LIPASE, AMYLASE in the last 168 hours. No results for input(s): AMMONIA in the last 168 hours. Coagulation Profile: No results for input(s): INR, PROTIME in the last 168 hours. Cardiac Enzymes: No results for input(s): CKTOTAL, CKMB, CKMBINDEX, TROPONINI in the last 168 hours. BNP (last 3 results) No results for input(s): PROBNP in the last 8760 hours. HbA1C: No results for input(s): HGBA1C in the last 72 hours. CBG: No results for input(s): GLUCAP in the last 168 hours. Lipid Profile: No results for input(s): CHOL, HDL, LDLCALC, TRIG, CHOLHDL, LDLDIRECT in the last 72 hours. Thyroid Function Tests: No results for input(s): TSH, T4TOTAL, FREET4, T3FREE, THYROIDAB in the last 72 hours. Anemia Panel: No results for input(s): VITAMINB12, FOLATE, FERRITIN, TIBC, IRON, RETICCTPCT in the last 72 hours. Sepsis Labs: Recent Labs  Lab 04/25/17 7672  LATICACIDVEN 1.49    Recent Results (from the past 240 hour(s))  Urine culture     Status: Abnormal   Collection Time: 04/25/17  8:30 AM  Result Value Ref Range Status   Specimen Description URINE, CLEAN CATCH  Final   Special Requests Normal  Final   Culture MULTIPLE SPECIES PRESENT, SUGGEST RECOLLECTION (A)  Final   Report Status 04/26/2017 FINAL  Final  MRSA PCR Screening     Status: None   Collection Time: 04/26/17  1:52 AM  Result Value Ref Range Status   MRSA by PCR NEGATIVE NEGATIVE Final    Comment:        The GeneXpert MRSA Assay (FDA approved for NASAL specimens only), is one component of a comprehensive MRSA colonization surveillance program. It is not intended to diagnose MRSA infection nor to guide or monitor treatment for MRSA infections.          Radiology Studies: Dg Chest Port 1 View  Result Date: 04/27/2017 CLINICAL DATA:  Dyspnea EXAM: PORTABLE CHEST 1 VIEW COMPARISON:  04/25/2017 FINDINGS: COPD. Mild bibasilar airspace disease has progressed in the interval. Negative for heart failure or  edema. Small left effusion has developed in the interval. IMPRESSION: Interval development of mild bibasilar atelectasis/ infiltrate. Small left effusion without edema Electronically Signed   By: Franchot Gallo M.D.   On: 04/27/2017 09:45        Scheduled Meds: . donepezil  5 mg Oral QHS  . escitalopram  10 mg Oral q morning - 10a  . irbesartan  150 mg Oral Daily   And  . hydrochlorothiazide  12.5 mg Oral Daily  . latanoprost  1 drop Both Eyes q morning - 10a  . nebivolol  10 mg Oral Daily  . pantoprazole  40 mg Oral Daily  . Rivaroxaban  15 mg Oral Q breakfast   Continuous Infusions: . cefTRIAXone (ROCEPHIN)  IV Stopped (04/27/17 1037)     LOS: 0 days     Cordelia Poche, MD [facility] 04/27/2017, 1:03 PM Pager: [phone]  If 7PM-7AM, please contact night-coverage [email or web address] Password [email or web address] 04/27/2017, 1:03 PM

## 2017-04-27 NOTE — Social Work (Signed)
CSW discussed with SNF patient returning to [facility] for short term rehab and Butch Penny in admission confirmed same.  CSW will continue to follow for disposition.  Elissa Hefty, LCSW Clinical Social Worker [phone]

## 2017-04-28 LAB — URINE CULTURE: CULTURE: NO GROWTH

## 2017-04-28 LAB — COMPREHENSIVE METABOLIC PANEL
ALT: 10 U/L — AB (ref 14–54)
AST: 17 U/L (ref 15–41)
Albumin: 2.2 g/dL — ABNORMAL LOW (ref 3.5–5.0)
Alkaline Phosphatase: 189 U/L — ABNORMAL HIGH (ref 38–126)
Anion gap: 10 (ref 5–15)
BUN: 28 mg/dL — AB (ref 6–20)
CHLORIDE: 98 mmol/L — AB (ref 101–111)
CO2: 24 mmol/L (ref 22–32)
CREATININE: 1.36 mg/dL — AB (ref 0.44–1.00)
Calcium: 8.4 mg/dL — ABNORMAL LOW (ref 8.9–10.3)
GFR calc non Af Amer: 34 mL/min — ABNORMAL LOW (ref >60)
GFR, EST AFRICAN AMERICAN: 39 mL/min — AB (ref >60)
Glucose, Bld: 113 mg/dL — ABNORMAL HIGH (ref 65–99)
POTASSIUM: 3.2 mmol/L — AB (ref 3.5–5.1)
SODIUM: 132 mmol/L — AB (ref 135–145)
Total Bilirubin: 0.8 mg/dL (ref 0.3–1.2)
Total Protein: 5.7 g/dL — ABNORMAL LOW (ref 6.5–8.1)

## 2017-04-28 LAB — PROCALCITONIN: PROCALCITONIN: 0.31 ng/mL

## 2017-04-28 LAB — CBC
HCT: 31.8 % — ABNORMAL LOW (ref 36.0–46.0)
Hemoglobin: 9.8 g/dL — ABNORMAL LOW (ref 12.0–15.0)
MCH: 26.7 pg (ref 26.0–34.0)
MCHC: 30.8 g/dL (ref 30.0–36.0)
MCV: 86.6 fL (ref 78.0–100.0)
PLATELETS: 428 10*3/uL — AB (ref 150–400)
RBC: 3.67 MIL/uL — AB (ref 3.87–5.11)
RDW: 15 % (ref 11.5–15.5)
WBC: 17.3 10*3/uL — ABNORMAL HIGH (ref 4.0–10.5)

## 2017-04-28 MED ORDER — POTASSIUM CHLORIDE CRYS ER 20 MEQ PO TBCR
40.0000 meq | EXTENDED_RELEASE_TABLET | Freq: Once | ORAL | Status: AC
  Administered 2017-04-28: 40 meq via ORAL
  Filled 2017-04-28: qty 2

## 2017-04-28 MED ORDER — SODIUM CHLORIDE 0.9 % IV BOLUS (SEPSIS)
500.0000 mL | Freq: Once | INTRAVENOUS | Status: AC
  Administered 2017-04-28: 500 mL via INTRAVENOUS

## 2017-04-28 NOTE — Consult Note (Signed)
[facility] CM Primary Care Navigator  04/28/2017  JAKI STEPTOE [birth year] [id number]   Met with patientat the bedside to identify possible discharge needs. Patient presented with generalized weakness and dyspnea which led to this admission.   According to patient, she is residing at [facility] facility for about 3 years. Patientendorses Dr. Burnard Bunting with [place] as theprimary care provider.   Patient states using [facility] on [address] to obtain medications without difficulty.  Patient reports that facility staff dispense and administer her medications and provide transportation to her doctors' appointments if her daughter is unable.  Patient states that facility staff assist with care if called or if needed.  Plan for discharge is to return back to [facility] with skilled level of care for rehab per therapy recommendation.    Patientvoiced understanding to call primary care provider's officewhen shereturns home,for a post discharge follow-up within a week or sooner if needs arise.Patient letter (with PCP's contact number) was provided as areminder.  Discussed with [facility]regarding [facility] CM services available for health management but she denies anyneeds or concerns at this time.  Patient voicedunderstanding to seekreferral from primary care provider to [facility] care management ifdeemed necessary and appropriatefor services in the future.  Patient would only opt and verbally agree for EMMI calls to follow-up with herrecovery.   Referral made for [facility] General calls after discharge.    For additional questions please contact:  Edwena Felty A. Shizuo Biskup, BSN, RN-BC [facility] PRIMARY CARE Navigator Cell: [phone]

## 2017-04-28 NOTE — Progress Notes (Signed)
PROGRESS NOTE    Martha Hendrix  AVW:[id number] DOB: 05-23-1927 DOA: 04/25/2017 PCP: Burnard Bunting, MD   Brief Narrative: Martha Hendrix is a 83 y.o. female with a history of spinal stenosis, insomnia, hypertension, headaches, GERD, esophageal stricture, depression, dementia, atrial fibrillation, anemia. She presented with generalized weakness and dyspnea in addition to CVA tenderness. Concern for early pyelonephritis and she was started on Ceftriaxone empirically. Urine cultures significant for multiple species. She developed RVR of her atrial fibrillation likely secondary to not being on her home Bystolic.    Assessment & Plan:   Active Problems:   GERD   Hypertension   Pyelonephritis   Dementia   PAF (paroxysmal atrial fibrillation) (HCC)   Hypokalemia   Pyelonephritis Sepsis Sepsis not present on admission presumed source is urinary. Clinical symptoms of left flank pain. Urinalysis was contaminated. Urine cultures significant for multiple species. Increasing WBC so may not be covering organism. No prior urine cultures with organisms available. Repeat urine culture negative. -Continue Vancomycin and Cefepime -blood culture pending  Hypokalemia -supplementation  Atrial fibrillation with RVR Likely secondary to not receiving Bystolic. Resolved. -Continue home Bystolic -Continue Xarelto  Essential hypertension -Continue valsartan and hydrochlorothiazide -Continue home Bystolic -hydralazine prn  Dementia Baseline -continue Aricept  Elevated alkaline phosphatase No abdominal pain, continues to be elevated but trending down. RUQ wnl.  Left hand swelling Pitting. Likely secondary to IV. Low suspicion for D/SVT -venous ultrasound   DVT prophylaxis: Xarelto Code Status: DNR Family Communication: Daughter in Sports coach by telephone Disposition Plan: Discharge to SNF when medically stable.   Consultants:   None  Procedures:    None  Antimicrobials:  Ceftriaxone    Subjective: No pain. Febrile yesterday to 102.1 farenheit. Afebrile overnight. Had chlls.  Objective: Vitals:   04/27/17 2010 04/27/17 2028 04/27/17 2200 04/28/17 0610  BP: (!) 141/82   137/77  Pulse: (!) 110   87  Resp: (!) 21   19  Temp: 98.2 F (36.8 C) (!) 102.1 F (38.9 C) 99.8 F (37.7 C) 98.8 F (37.1 C)  TempSrc: Oral Oral Oral   SpO2: 97%   99%  Weight:      Height:        Intake/Output Summary (Last 24 hours) at 04/28/2017 1132 Last data filed at 04/28/2017 0400 Gross per 24 hour  Intake 2160 ml  Output -  Net 2160 ml   Filed Weights   04/25/17 0702  Weight: 65.8 kg (145 lb)    Examination:  General exam: Appears calm and comfortable Respiratory system: Clear to auscultation. Respiratory effort normal. Cardiovascular system: S1 & S2 heard, irregular rhythm with regular rate. No murmurs, rubs, gallops or clicks. Gastrointestinal system: Abdomen is nondistended, soft and nontender. Normal bowel sounds heard. Central nervous system: Alert and oriented to person. No focal neurological deficits. Extremities: No edema. No calf tenderness Skin: No cyanosis. No rashes Psychiatry: Judgement and insight appear poor. Mood & affect appropriate.     Data Reviewed: I have personally reviewed following labs and imaging studies  CBC: Recent Labs  Lab 04/25/17 0720 04/26/17 0521 04/27/17 1134 04/28/17 0337  WBC 12.0* 13.7* 19.8* 17.3*  NEUTROABS 8.2*  --   --   --   HGB 10.6* 10.7* 10.3* 9.8*  HCT 32.9* 34.2* 33.4* 31.8*  MCV 88.0 87.7 87.4 86.6  PLT 475* 480* 503* 829*   Basic Metabolic Panel: Recent Labs  Lab 04/25/17 0720 04/26/17 0521 04/27/17 0528 04/28/17 0337  NA 133* 134* 133* 132*  K  3.3* 4.0 3.3* 3.2*  CL 98* 101 99* 98*  CO2 24 23 26 24   GLUCOSE 98 113* 112* 113*  BUN 24* 20 21* 28*  CREATININE 1.29* 1.15* 1.17* 1.36*  CALCIUM 8.6* 8.7* 8.4* 8.4*   GFR: Estimated Creatinine Clearance:  26.3 mL/min (A) (by C-G formula based on SCr of 1.36 mg/dL (H)). Liver Function Tests: Recent Labs  Lab 04/25/17 0720 04/27/17 0528 04/28/17 0337  AST 18 18 17   ALT 15 12* 10*  ALKPHOS 211* 214* 189*  BILITOT 1.4* 1.0 0.8  PROT 6.6 6.0* 5.7*  ALBUMIN 2.6* 2.2* 2.2*   No results for input(s): LIPASE, AMYLASE in the last 168 hours. No results for input(s): AMMONIA in the last 168 hours. Coagulation Profile: No results for input(s): INR, PROTIME in the last 168 hours. Cardiac Enzymes: No results for input(s): CKTOTAL, CKMB, CKMBINDEX, TROPONINI in the last 168 hours. BNP (last 3 results) No results for input(s): PROBNP in the last 8760 hours. HbA1C: No results for input(s): HGBA1C in the last 72 hours. CBG: No results for input(s): GLUCAP in the last 168 hours. Lipid Profile: No results for input(s): CHOL, HDL, LDLCALC, TRIG, CHOLHDL, LDLDIRECT in the last 72 hours. Thyroid Function Tests: No results for input(s): TSH, T4TOTAL, FREET4, T3FREE, THYROIDAB in the last 72 hours. Anemia Panel: No results for input(s): VITAMINB12, FOLATE, FERRITIN, TIBC, IRON, RETICCTPCT in the last 72 hours. Sepsis Labs: Recent Labs  Lab 04/25/17 0929 04/27/17 2150 04/28/17 0337  PROCALCITON  --  0.26 0.31  LATICACIDVEN 1.49  --   --     Recent Results (from the past 240 hour(s))  Urine culture     Status: Abnormal   Collection Time: 04/25/17  8:30 AM  Result Value Ref Range Status   Specimen Description URINE, CLEAN CATCH  Final   Special Requests Normal  Final   Culture MULTIPLE SPECIES PRESENT, SUGGEST RECOLLECTION (A)  Final   Report Status 04/26/2017 FINAL  Final  MRSA PCR Screening     Status: None   Collection Time: 04/26/17  1:52 AM  Result Value Ref Range Status   MRSA by PCR NEGATIVE NEGATIVE Final    Comment:        The GeneXpert MRSA Assay (FDA approved for NASAL specimens only), is one component of a comprehensive MRSA colonization surveillance program. It is  not intended to diagnose MRSA infection nor to guide or monitor treatment for MRSA infections.   Culture, Urine     Status: None   Collection Time: 04/27/17  3:00 PM  Result Value Ref Range Status   Specimen Description URINE, CLEAN CATCH  Final   Special Requests NONE  Final   Culture NO GROWTH  Final   Report Status 04/28/2017 FINAL  Final         Radiology Studies: Dg Chest Port 1 View  Result Date: 04/27/2017 CLINICAL DATA:  Dyspnea EXAM: PORTABLE CHEST 1 VIEW COMPARISON:  04/25/2017 FINDINGS: COPD. Mild bibasilar airspace disease has progressed in the interval. Negative for heart failure or edema. Small left effusion has developed in the interval. IMPRESSION: Interval development of mild bibasilar atelectasis/ infiltrate. Small left effusion without edema Electronically Signed   By: Franchot Gallo M.D.   On: 04/27/2017 09:45   US Abdomen Limited Ruq  Result Date: 04/27/2017 CLINICAL DATA:  Elevated alkaline phosphatase level. EXAM: ULTRASOUND ABDOMEN LIMITED RIGHT UPPER QUADRANT COMPARISON:  None. FINDINGS: Gallbladder: No gallstones or wall thickening visualized. No sonographic Murphy sign noted by sonographer. Common bile  duct: Diameter: 6.7 mm which is within normal limits. Liver: No focal lesion identified. Within normal limits in parenchymal echogenicity. Portal vein is patent on color Doppler imaging with normal direction of blood flow towards the liver. IMPRESSION: No abnormality seen in the right upper quadrant of the abdomen. Electronically Signed   By: Marijo Conception, M.D.   On: 04/27/2017 16:52        Scheduled Meds: . donepezil  5 mg Oral QHS  . escitalopram  10 mg Oral q morning - 10a  . irbesartan  150 mg Oral Daily   And  . hydrochlorothiazide  12.5 mg Oral Daily  . latanoprost  1 drop Both Eyes q morning - 10a  . nebivolol  10 mg Oral Daily  . pantoprazole  40 mg Oral Daily  . Rivaroxaban  15 mg Oral Q breakfast   Continuous Infusions: . ceFEPime  (MAXIPIME) IV Stopped (04/27/17 2249)  . vancomycin       LOS: 1 day     Cordelia Poche, MD [facility] 04/28/2017, 11:32 AM Pager: [phone]  If 7PM-7AM, please contact night-coverage [email or web address] Password [email or web address] 04/28/2017, 11:32 AM

## 2017-04-29 ENCOUNTER — Inpatient Hospital Stay (HOSPITAL_COMMUNITY): Payer: Medicare Other

## 2017-04-29 DIAGNOSIS — R609 Edema, unspecified: Secondary | ICD-10-CM

## 2017-04-29 LAB — CBC
HEMATOCRIT: 30.9 % — AB (ref 36.0–46.0)
Hemoglobin: 9.5 g/dL — ABNORMAL LOW (ref 12.0–15.0)
MCH: 26.7 pg (ref 26.0–34.0)
MCHC: 30.7 g/dL (ref 30.0–36.0)
MCV: 86.8 fL (ref 78.0–100.0)
PLATELETS: 405 10*3/uL — AB (ref 150–400)
RBC: 3.56 MIL/uL — ABNORMAL LOW (ref 3.87–5.11)
RDW: 14.9 % (ref 11.5–15.5)
WBC: 16.1 10*3/uL — ABNORMAL HIGH (ref 4.0–10.5)

## 2017-04-29 LAB — BASIC METABOLIC PANEL
Anion gap: 7 (ref 5–15)
BUN: 28 mg/dL — AB (ref 6–20)
CALCIUM: 8.4 mg/dL — AB (ref 8.9–10.3)
CO2: 27 mmol/L (ref 22–32)
CREATININE: 1.1 mg/dL — AB (ref 0.44–1.00)
Chloride: 99 mmol/L — ABNORMAL LOW (ref 101–111)
GFR calc non Af Amer: 44 mL/min — ABNORMAL LOW (ref >60)
GFR, EST AFRICAN AMERICAN: 50 mL/min — AB (ref >60)
Glucose, Bld: 100 mg/dL — ABNORMAL HIGH (ref 65–99)
Potassium: 3.4 mmol/L — ABNORMAL LOW (ref 3.5–5.1)
Sodium: 133 mmol/L — ABNORMAL LOW (ref 135–145)

## 2017-04-29 LAB — PROCALCITONIN: PROCALCITONIN: 0.23 ng/mL

## 2017-04-29 MED ORDER — POTASSIUM CHLORIDE CRYS ER 20 MEQ PO TBCR
40.0000 meq | EXTENDED_RELEASE_TABLET | Freq: Once | ORAL | Status: AC
  Administered 2017-04-29: 40 meq via ORAL
  Filled 2017-04-29: qty 2

## 2017-04-29 NOTE — Progress Notes (Signed)
Pt back from vascular lab

## 2017-04-29 NOTE — Progress Notes (Signed)
VASCULAR LAB PRELIMINARY  PRELIMINARY  PRELIMINARY  PRELIMINARY  Left upper extremity venous duplex completed.    Preliminary report:  There is no DVT or SVT noted in the left upper extremity.  Interstitial fluid noted throughout the left upper extremity.   Rithika Seel, RVT 04/29/2017, 9:34 AM

## 2017-04-29 NOTE — Progress Notes (Signed)
Pt transfer to vascular to check for upper arm swelling r/o DVT.

## 2017-04-29 NOTE — Progress Notes (Signed)
PROGRESS NOTE    Martha Hendrix  HUD:[id number] DOB: 18-Dec-1926 DOA: 04/25/2017 PCP: Burnard Bunting, MD   Brief Narrative: Martha Hendrix is a 83 y.o. female with a history of spinal stenosis, insomnia, hypertension, headaches, GERD, esophageal stricture, depression, dementia, atrial fibrillation, anemia. She presented with generalized weakness and dyspnea in addition to CVA tenderness. Concern for early pyelonephritis and she was started on Ceftriaxone empirically. Urine cultures significant for multiple species. She developed RVR of her atrial fibrillation likely secondary to not being on her home Bystolic.    Assessment & Plan:   Active Problems:   GERD   Hypertension   Pyelonephritis   Dementia   PAF (paroxysmal atrial fibrillation) (HCC)   Hypokalemia   Pyelonephritis Sepsis Sepsis not present on admission presumed source is urinary. Clinical symptoms of left flank pain. Urinalysis was contaminated. Urine cultures significant for multiple species. Increasing WBC so may not be covering organism. No prior urine cultures with organisms available. Repeat urine culture negative. No ulcers on skin exam. Improving overall. -Continue Vancomycin and Cefepime -blood culture pending -if blood culture negative x 48 hours, will consult infectious disease for help on discharge regimen  Hypokalemia -supplementation -check magnesium  Atrial fibrillation with RVR Likely secondary to not receiving Bystolic. Resolved. -Continue home Bystolic -Continue Xarelto  Essential hypertension -Continue valsartan and hydrochlorothiazide -Continue home Bystolic -hydralazine prn  Dementia Baseline -continue Aricept  Elevated alkaline phosphatase No abdominal pain, continues to be elevated but trending down. RUQ wnl.  Left hand swelling Pitting. Likely secondary to IV. Low suspicion for D/SVT. Ultrasound negative for DVT.   DVT prophylaxis: Xarelto Code Status: DNR Family  Communication: Daughter in Sports coach by telephone Disposition Plan: Discharge to SNF when medically stable.   Consultants:   None  Procedures:   None  Antimicrobials:  Ceftriaxone    Subjective: No pain. Febrile yesterday to 102.1 farenheit. Afebrile overnight. Had chlls.  Objective: Vitals:   04/28/17 0610 04/28/17 1429 04/28/17 2145 04/29/17 0528  BP: 137/77 136/88 127/74 130/72  Pulse: 87 92 95 83  Resp: 19 18 18 18   Temp: 98.8 F (37.1 C) 98.3 F (36.8 C) 98.5 F (36.9 C) 98.4 F (36.9 C)  TempSrc:  Oral Oral Oral  SpO2: 99% 96% 98% 98%  Weight:      Height:        Intake/Output Summary (Last 24 hours) at 04/29/2017 0914 Last data filed at 04/29/2017 0700 Gross per 24 hour  Intake 810 ml  Output 650 ml  Net 160 ml   Filed Weights   04/25/17 0702  Weight: 65.8 kg (145 lb)    Examination:  General exam: Appears calm and comfortable Respiratory system: Clear to auscultation. Respiratory effort normal. Cardiovascular system: S1 & S2 heard, irregular rhythm with regular rate. No murmurs, rubs, gallops or clicks. Gastrointestinal system: Abdomen is nondistended, soft and nontender. Normal bowel sounds heard. Central nervous system: Alert and oriented to person. No focal neurological deficits. Extremities: Left arm edema with mild right arm edema. No LE edema. No calf tenderness Skin: No cyanosis. No rashes. No ulcers located over bony prominences Psychiatry: Judgement and insight appear poor. Mood & affect appropriate.     Data Reviewed: I have personally reviewed following labs and imaging studies  CBC: Recent Labs  Lab 04/25/17 0720 04/26/17 0521 04/27/17 1134 04/28/17 0337 04/29/17 0725  WBC 12.0* 13.7* 19.8* 17.3* 16.1*  NEUTROABS 8.2*  --   --   --   --   HGB 10.6* 10.7*  10.3* 9.8* 9.5*  HCT 32.9* 34.2* 33.4* 31.8* 30.9*  MCV 88.0 87.7 87.4 86.6 86.8  PLT 475* 480* 503* 428* 998*   Basic Metabolic Panel: Recent Labs  Lab 04/25/17 0720  04/26/17 0521 04/27/17 0528 04/28/17 0337 04/29/17 0725  NA 133* 134* 133* 132* 133*  K 3.3* 4.0 3.3* 3.2* 3.4*  CL 98* 101 99* 98* 99*  CO2 24 23 26 24 27   GLUCOSE 98 113* 112* 113* 100*  BUN 24* 20 21* 28* 28*  CREATININE 1.29* 1.15* 1.17* 1.36* 1.10*  CALCIUM 8.6* 8.7* 8.4* 8.4* 8.4*   GFR: Estimated Creatinine Clearance: 32.5 mL/min (A) (by C-G formula based on SCr of 1.1 mg/dL (H)). Liver Function Tests: Recent Labs  Lab 04/25/17 0720 04/27/17 0528 04/28/17 0337  AST 18 18 17   ALT 15 12* 10*  ALKPHOS 211* 214* 189*  BILITOT 1.4* 1.0 0.8  PROT 6.6 6.0* 5.7*  ALBUMIN 2.6* 2.2* 2.2*   No results for input(s): LIPASE, AMYLASE in the last 168 hours. No results for input(s): AMMONIA in the last 168 hours. Coagulation Profile: No results for input(s): INR, PROTIME in the last 168 hours. Cardiac Enzymes: No results for input(s): CKTOTAL, CKMB, CKMBINDEX, TROPONINI in the last 168 hours. BNP (last 3 results) No results for input(s): PROBNP in the last 8760 hours. HbA1C: No results for input(s): HGBA1C in the last 72 hours. CBG: No results for input(s): GLUCAP in the last 168 hours. Lipid Profile: No results for input(s): CHOL, HDL, LDLCALC, TRIG, CHOLHDL, LDLDIRECT in the last 72 hours. Thyroid Function Tests: No results for input(s): TSH, T4TOTAL, FREET4, T3FREE, THYROIDAB in the last 72 hours. Anemia Panel: No results for input(s): VITAMINB12, FOLATE, FERRITIN, TIBC, IRON, RETICCTPCT in the last 72 hours. Sepsis Labs: Recent Labs  Lab 04/25/17 0929 04/27/17 2150 04/28/17 0337 04/29/17 0327  PROCALCITON  --  0.26 0.31 0.23  LATICACIDVEN 1.49  --   --   --     Recent Results (from the past 240 hour(s))  Urine culture     Status: Abnormal   Collection Time: 04/25/17  8:30 AM  Result Value Ref Range Status   Specimen Description URINE, CLEAN CATCH  Final   Special Requests Normal  Final   Culture MULTIPLE SPECIES PRESENT, SUGGEST RECOLLECTION (A)  Final    Report Status 04/26/2017 FINAL  Final  MRSA PCR Screening     Status: None   Collection Time: 04/26/17  1:52 AM  Result Value Ref Range Status   MRSA by PCR NEGATIVE NEGATIVE Final    Comment:        The GeneXpert MRSA Assay (FDA approved for NASAL specimens only), is one component of a comprehensive MRSA colonization surveillance program. It is not intended to diagnose MRSA infection nor to guide or monitor treatment for MRSA infections.   Culture, blood (routine x 2)     Status: None (Preliminary result)   Collection Time: 04/27/17  1:17 PM  Result Value Ref Range Status   Specimen Description BLOOD RIGHT ANTECUBITAL  Final   Special Requests IN PEDIATRIC BOTTLE Blood Culture adequate volume  Final   Culture NO GROWTH < 24 HOURS  Final   Report Status PENDING  Incomplete  Culture, blood (routine x 2)     Status: None (Preliminary result)   Collection Time: 04/27/17  1:20 PM  Result Value Ref Range Status   Specimen Description BLOOD RIGHT WRIST  Final   Special Requests IN PEDIATRIC BOTTLE Blood Culture adequate volume  Final   Culture NO GROWTH < 24 HOURS  Final   Report Status PENDING  Incomplete  Culture, Urine     Status: None   Collection Time: 04/27/17  3:00 PM  Result Value Ref Range Status   Specimen Description URINE, CLEAN CATCH  Final   Special Requests NONE  Final   Culture NO GROWTH  Final   Report Status 04/28/2017 FINAL  Final         Radiology Studies: Dg Chest Port 1 View  Result Date: 04/27/2017 CLINICAL DATA:  Dyspnea EXAM: PORTABLE CHEST 1 VIEW COMPARISON:  04/25/2017 FINDINGS: COPD. Mild bibasilar airspace disease has progressed in the interval. Negative for heart failure or edema. Small left effusion has developed in the interval. IMPRESSION: Interval development of mild bibasilar atelectasis/ infiltrate. Small left effusion without edema Electronically Signed   By: Franchot Gallo M.D.   On: 04/27/2017 09:45   US Abdomen Limited Ruq  Result  Date: 04/27/2017 CLINICAL DATA:  Elevated alkaline phosphatase level. EXAM: ULTRASOUND ABDOMEN LIMITED RIGHT UPPER QUADRANT COMPARISON:  None. FINDINGS: Gallbladder: No gallstones or wall thickening visualized. No sonographic Murphy sign noted by sonographer. Common bile duct: Diameter: 6.7 mm which is within normal limits. Liver: No focal lesion identified. Within normal limits in parenchymal echogenicity. Portal vein is patent on color Doppler imaging with normal direction of blood flow towards the liver. IMPRESSION: No abnormality seen in the right upper quadrant of the abdomen. Electronically Signed   By: Marijo Conception, M.D.   On: 04/27/2017 16:52        Scheduled Meds: . donepezil  5 mg Oral QHS  . escitalopram  10 mg Oral q morning - 10a  . irbesartan  150 mg Oral Daily   And  . hydrochlorothiazide  12.5 mg Oral Daily  . latanoprost  1 drop Both Eyes q morning - 10a  . nebivolol  10 mg Oral Daily  . pantoprazole  40 mg Oral Daily  . Rivaroxaban  15 mg Oral Q breakfast   Continuous Infusions: . ceFEPime (MAXIPIME) IV Stopped (04/28/17 2205)  . vancomycin Stopped (04/28/17 2302)     LOS: 2 days     Cordelia Poche, MD [facility] 04/29/2017, 9:14 AM Pager: [phone]  If 7PM-7AM, please contact night-coverage [email or web address] Password [email or web address] 04/29/2017, 9:14 AM

## 2017-04-30 ENCOUNTER — Inpatient Hospital Stay (HOSPITAL_COMMUNITY): Payer: Medicare Other

## 2017-04-30 ENCOUNTER — Encounter (HOSPITAL_COMMUNITY): Payer: Self-pay

## 2017-04-30 LAB — CBC
HCT: 32.8 % — ABNORMAL LOW (ref 36.0–46.0)
Hemoglobin: 10.2 g/dL — ABNORMAL LOW (ref 12.0–15.0)
MCH: 27 pg (ref 26.0–34.0)
MCHC: 31.1 g/dL (ref 30.0–36.0)
MCV: 86.8 fL (ref 78.0–100.0)
PLATELETS: 487 10*3/uL — AB (ref 150–400)
RBC: 3.78 MIL/uL — ABNORMAL LOW (ref 3.87–5.11)
RDW: 15 % (ref 11.5–15.5)
WBC: 15.4 10*3/uL — ABNORMAL HIGH (ref 4.0–10.5)

## 2017-04-30 LAB — BASIC METABOLIC PANEL
Anion gap: 8 (ref 5–15)
BUN: 27 mg/dL — AB (ref 6–20)
CHLORIDE: 97 mmol/L — AB (ref 101–111)
CO2: 26 mmol/L (ref 22–32)
CREATININE: 1.03 mg/dL — AB (ref 0.44–1.00)
Calcium: 8.6 mg/dL — ABNORMAL LOW (ref 8.9–10.3)
GFR calc Af Amer: 55 mL/min — ABNORMAL LOW (ref >60)
GFR calc non Af Amer: 47 mL/min — ABNORMAL LOW (ref >60)
Glucose, Bld: 109 mg/dL — ABNORMAL HIGH (ref 65–99)
Potassium: 3.6 mmol/L (ref 3.5–5.1)
SODIUM: 131 mmol/L — AB (ref 135–145)

## 2017-04-30 LAB — MAGNESIUM: Magnesium: 1.6 mg/dL — ABNORMAL LOW (ref 1.7–2.4)

## 2017-04-30 MED ORDER — NEBIVOLOL HCL 10 MG PO TABS: 10.0000 mg | ORAL_TABLET | Freq: Every day | ORAL | Status: AC

## 2017-04-30 MED ORDER — ZOLPIDEM TARTRATE 5 MG PO TABS: 5.0000 mg | ORAL_TABLET | Freq: Every evening | ORAL | 0 refills | Status: AC | PRN

## 2017-04-30 MED ORDER — FOSFOMYCIN TROMETHAMINE 3 G PO PACK
3.0000 g | PACK | Freq: Once | ORAL | Status: AC
  Administered 2017-04-30: 3 g via ORAL
  Filled 2017-04-30: qty 3

## 2017-04-30 MED ORDER — FOSFOMYCIN TROMETHAMINE 3 G PO PACK: 3.0000 g | PACK | Freq: Once | ORAL | 0 refills | Status: AC

## 2017-04-30 MED ORDER — MAGNESIUM SULFATE 2 GM/50ML IV SOLN
2.0000 g | Freq: Once | INTRAVENOUS | Status: AC
Start: 2017-04-30 — End: 2017-04-30
  Administered 2017-04-30: 2 g via INTRAVENOUS
  Filled 2017-04-30: qty 50

## 2017-04-30 MED ORDER — IOPAMIDOL (ISOVUE-300) INJECTION 61%
INTRAVENOUS | Status: AC
  Administered 2017-04-30: 100 mL
  Filled 2017-04-30: qty 100

## 2017-04-30 NOTE — Clinical Social Work Note (Signed)
Clinical Social Worker facilitated patient discharge including contacting patient family and facility to confirm patient discharge plans.  Clinical information faxed to facility and family agreeable with plan.  CSW arranged ambulance transport via [facility] to [place] [phone]).  RN to call report prior to discharge.  Clinical Social Worker will sign off for now as social work intervention is no longer needed. Please consult [place] again if new need arises.  Barbette Or, [place]

## 2017-04-30 NOTE — Discharge Summary (Signed)
Physician Discharge Summary  MERIAH SHANDS TOI:[id number] DOB: 05-27-1927 DOA: 04/25/2017  PCP: Burnard Bunting, MD  Admit date: 04/25/2017 Discharge date: 04/30/2017  Admitted From: ALF Disposition: SNF  Recommendations for Outpatient Follow-up:  1. Follow up with PCP in 1 week 2. Please obtain BMP/CBC in one week 3. Please follow up on the following pending results: Blood culture (final result)  Home Health: SNF Equipment/Devices: None  Discharge Condition: Stable CODE STATUS: DNR Diet recommendation: Heart healthy   Brief/Interim Summary:  Admission HPI written by Waldemar Dickens, MD   Chief Complaint: generalized weakness and sob  HPI: Martha Hendrix is a 83 y.o. female with medical history significant of spinal stenosis, insomnia, hypertension, headaches, GERD, esophageal stricture, depression, dementia, atrial fibrillation, anemia. Symptoms started a partially 24 hours prior to admission with a complaint of generalized weakness and shortness of breath. At time of presentation to the ED and at admission patient has no complaints of shortness of breath. This seems to be worse related to simply feeling tired and winded as opposed to true shortness of breath. No change with exertion. Denies any cough, sputum production, chest pain, fevers, lower extremity swelling, orthopnea, nausea, vomiting, focal neurological deficit.  Level V caveat applies as pt w/ dementia that limits memory and recall. hsitory provided by pt and pts son and daughter in law   Hospital course:  Pyelonephritis Sepsis Sepsis not present on admission presumed source is urinary. Clinical symptoms of left flank pain. Urinalysis was contaminated. Initial urine culture significant for multiple species. WBC started increasing with febrile temperature of 102.1 degrees farenheit. Blood culture and repeat urine culture obtained. Ceftriaxone escalated to vancomycin and cefepime. Culture data no growth to  date. Discussed with ID and recommended CT chest and abdomen/pelvis which were unremarkable for source of infection. Patient transitioned to fosfomycin and given one dose inpatient. She will have one more dose as an outpatient to complete 10 days.   Hypokalemia Resolved with supplementation. Magnesium low and repleted. Recheck as an outpatient.   Atrial fibrillation with RVR Likely secondary to not receiving Bystolic. Resolved after restarting home Bystolic. Continued Xarelto.  Essential hypertension Continued valsartan, hydrochlorothiazide and Bystolic.  Dementia Baseline. Continued Aricept  Elevated alkaline phosphatase No abdominal pain, continues to be elevated but trending down. RUQ ultrasound normal. Repeat CMP outpatient.  Left hand swelling Pitting. Likely secondary to IV. Low suspicion for D/SVT. Ultrasound negative for DVT. Improving slowly.  Pulmonary nodules Incidental. Two 6 mm nodules. Radiologist recommends repeat non-contrast chest CT in 3-6 months  Renal cysts Recommend MRI as an outpatient for follow-up.    Discharge Diagnoses:  Active Problems:   GERD   Hypertension   Pyelonephritis   Dementia   PAF (paroxysmal atrial fibrillation) (HCC)   Hypokalemia    Discharge Instructions  Discharge Instructions    Call MD for:  difficulty breathing, headache or visual disturbances   Complete by:  As directed    Call MD for:  persistant nausea and vomiting   Complete by:  As directed    Call MD for:  temperature >100.4   Complete by:  As directed    Diet - low sodium heart healthy   Complete by:  As directed    Increase activity slowly   Complete by:  As directed      Allergies as of 04/30/2017      Reactions   Codeine Nausea And Vomiting, Other (See Comments)   "spacy feeling"   Hydrocodone Nausea And Vomiting  spacey feeling    Celebrex [celecoxib]       Medication List    STOP taking these medications   ferrous sulfate 325 (65 FE) MG  tablet   levofloxacin 500 MG tablet Commonly known as:  LEVAQUIN   polyethylene glycol packet Commonly known as:  MIRALAX / GLYCOLAX     TAKE these medications   aspirin EC 325 MG tablet Take 1 tablet (325 mg total) by mouth daily.   donepezil 10 MG tablet Commonly known as:  ARICEPT Take 10 mg daily by mouth.   DSS 100 MG Caps Take 100 mg by mouth 2 (two) times daily.   escitalopram 10 MG tablet Commonly known as:  LEXAPRO Take 10 mg by mouth every morning.   feeding supplement (ENSURE COMPLETE) Liqd Take 237 mLs by mouth 2 (two) times daily between meals.   fosfomycin 3 g Pack Commonly known as:  MONUROL Take 3 g once for 1 dose by mouth. Start taking on:  05/03/2017   latanoprost 0.005 % ophthalmic solution Commonly known as:  XALATAN Place 1 drop daily into both eyes.   naproxen 500 MG tablet Commonly known as:  NAPROSYN Take 500 mg by mouth 2 (two) times daily with a meal.   nebivolol 10 MG tablet Commonly known as:  BYSTOLIC Take 1 tablet (10 mg total) daily by mouth. Start taking on:  05/01/2017 What changed:  how much to take   pantoprazole 40 MG tablet Commonly known as:  PROTONIX Take 40 mg by mouth daily.   Rivaroxaban 15 MG Tabs tablet Commonly known as:  XARELTO Take 15 mg by mouth daily.   tiZANidine 4 MG capsule Commonly known as:  ZANAFLEX Take 1 capsule (4 mg total) by mouth 3 (three) times daily as needed for muscle spasms.   valsartan-hydrochlorothiazide 160-12.5 MG tablet Commonly known as:  DIOVAN-HCT Take 1 tablet by mouth every morning.   zolpidem 5 MG tablet Commonly known as:  AMBIEN Take 1 tablet (5 mg total) at bedtime as needed by mouth for sleep. What changed:    medication strength  how much to take      Follow-up Information    Burnard Bunting, MD. Schedule an appointment as soon as possible for a visit in 1 week(s).   Specialty:  Internal Medicine Contact information: [place]  [id number] [phone]          Allergies  Allergen Reactions  . Codeine Nausea And Vomiting and Other (See Comments)    "spacy feeling"  . Hydrocodone Nausea And Vomiting    spacey feeling   . Celebrex [Celecoxib]     Consultations:  Infectious disease (telephone)   Procedures/Studies: Dg Chest 2 View  Result Date: 04/25/2017 CLINICAL DATA:  Shortness of breath.  Left flank pain. EXAM: CHEST  2 VIEW COMPARISON:  08/02/2016 . FINDINGS: Mediastinum and hilar structures normal. Cardiomegaly with normal pulmonary vascularity. Mild basilar atelectasis and/or scarring. Tiny left pleural effusion cannot be excluded. No pneumothorax. Diffuse thoracic spine osteopenia degenerative change . IMPRESSION: Mild basilar atelectasis and/or scarring . Tiny left pleural effusion cannot be excluded. No pneumothorax. Electronically Signed   By: Marcello Moores  Register   On: 04/25/2017 07:55   Ct Chest W Contrast  Result Date: 04/30/2017 CLINICAL DATA:  Fever of unknown origin. EXAM: CT CHEST, ABDOMEN, AND PELVIS WITH CONTRAST TECHNIQUE: Multidetector CT imaging of the chest, abdomen and pelvis was performed following the standard protocol during bolus administration of intravenous contrast. CONTRAST:  197mL ISOVUE-300  IOPAMIDOL (ISOVUE-300) INJECTION 61% COMPARISON:  None. FINDINGS: CT CHEST FINDINGS Cardiovascular: Heart size upper normal. No substantial pericardial effusion. No thoracic aortic aneurysm. Mediastinum/Nodes: 11 mm short axis subcarinal lymph node is upper normal. There is no hilar lymphadenopathy. The esophagus has normal imaging features. There is no axillary lymphadenopathy. Lungs/Pleura: 6 mm right middle lobe pulmonary nodule seen image 85 series 4. Subsegmental atelectasis evident in both lower lobes. 6 mm left lower lobe pulmonary nodule visible on image 78 series 4. No pulmonary edema. Small left pleural effusion noted. Musculoskeletal: Mild superior endplate compression deformity is  identified at T4. CT ABDOMEN PELVIS FINDINGS Hepatobiliary: No focal abnormality within the liver parenchyma. There is no evidence for gallstones, gallbladder wall thickening, or pericholecystic fluid. No intrahepatic or extrahepatic biliary dilation. Pancreas: No focal mass lesion. No dilatation of the main duct. No intraparenchymal cyst. No peripancreatic edema. Spleen: No splenomegaly. No focal mass lesion. Adrenals/Urinary Tract: No adrenal nodule or mass. 3.9 cm homogeneous low-density lesion lower pole right kidney is well-defined and has attenuation slightly higher than would be expected for a simple cyst. 4.2 cm homogeneous exophytic lesion lower pole left kidney has similar attenuation. No evidence for hydroureter. Bladder is obscured by streak artifact from bilateral hip replacement. Stomach/Bowel: Stomach is nondistended. No gastric wall thickening. No evidence of outlet obstruction. Duodenum is normally positioned as is the ligament of Treitz. No small bowel wall thickening. No small bowel dilatation. The terminal ileum is normal. The appendix is not visualized, but there is no edema or inflammation in the region of the cecum. Colon unremarkable. Vascular/Lymphatic: There is abdominal aortic atherosclerosis without aneurysm. There is no gastrohepatic or hepatoduodenal ligament lymphadenopathy. No intraperitoneal or retroperitoneal lymphadenopathy. Pelvic sidewalls obscured by streak artifact. Reproductive: Uterus largely obscured by streak artifact. There is no adnexal mass. Other: No substantial intraperitoneal free fluid although cul-de-sac is obscured. Musculoskeletal: Status post bilateral hip replacement. Bones are diffusely demineralized. Degenerative changes noted upper lumbar spine. IMPRESSION: 1. No acute findings in the chest, abdomen, or pelvis to explain the patient's history of fever. 2. Bilateral pulmonary nodules measuring up to 6 mm. Non-contrast chest CT at 3-6 months is recommended. If  the nodules are stable at time of repeat CT, then future CT at 18-24 months (from today's scan) is considered optional for low-risk patients, but is recommended for high-risk patients. This recommendation follows the consensus statement: Guidelines for Management of Incidental Pulmonary Nodules Detected on CT Images: From the Fleischner Society 2017; Radiology 2017; 284:228-243. 3. Large bilateral low-density renal lesions are likely cysts but have attenuation higher than would be expected for a simple cyst. These are probably cysts complicated by proteinaceous debris or hemorrhage. MRI without with contrast could be used to further evaluate if further characterization is warranted at this time. Alternatively, follow-up CT in 3-6 months could be used to ensure size stability. 4.  Aortic Atherosclerois (ICD10-170.0) 5. Bilateral hip replacement with streak artifact obscuring much of the inferior anatomic pelvis. Electronically Signed   By: Misty Stanley M.D.   On: 04/30/2017 14:33   Ct Abdomen Pelvis W Contrast  Result Date: 04/30/2017 CLINICAL DATA:  Fever of unknown origin. EXAM: CT CHEST, ABDOMEN, AND PELVIS WITH CONTRAST TECHNIQUE: Multidetector CT imaging of the chest, abdomen and pelvis was performed following the standard protocol during bolus administration of intravenous contrast. CONTRAST:  154mL ISOVUE-300 IOPAMIDOL (ISOVUE-300) INJECTION 61% COMPARISON:  None. FINDINGS: CT CHEST FINDINGS Cardiovascular: Heart size upper normal. No substantial pericardial effusion. No thoracic aortic aneurysm. Mediastinum/Nodes:  11 mm short axis subcarinal lymph node is upper normal. There is no hilar lymphadenopathy. The esophagus has normal imaging features. There is no axillary lymphadenopathy. Lungs/Pleura: 6 mm right middle lobe pulmonary nodule seen image 85 series 4. Subsegmental atelectasis evident in both lower lobes. 6 mm left lower lobe pulmonary nodule visible on image 78 series 4. No pulmonary edema.  Small left pleural effusion noted. Musculoskeletal: Mild superior endplate compression deformity is identified at T4. CT ABDOMEN PELVIS FINDINGS Hepatobiliary: No focal abnormality within the liver parenchyma. There is no evidence for gallstones, gallbladder wall thickening, or pericholecystic fluid. No intrahepatic or extrahepatic biliary dilation. Pancreas: No focal mass lesion. No dilatation of the main duct. No intraparenchymal cyst. No peripancreatic edema. Spleen: No splenomegaly. No focal mass lesion. Adrenals/Urinary Tract: No adrenal nodule or mass. 3.9 cm homogeneous low-density lesion lower pole right kidney is well-defined and has attenuation slightly higher than would be expected for a simple cyst. 4.2 cm homogeneous exophytic lesion lower pole left kidney has similar attenuation. No evidence for hydroureter. Bladder is obscured by streak artifact from bilateral hip replacement. Stomach/Bowel: Stomach is nondistended. No gastric wall thickening. No evidence of outlet obstruction. Duodenum is normally positioned as is the ligament of Treitz. No small bowel wall thickening. No small bowel dilatation. The terminal ileum is normal. The appendix is not visualized, but there is no edema or inflammation in the region of the cecum. Colon unremarkable. Vascular/Lymphatic: There is abdominal aortic atherosclerosis without aneurysm. There is no gastrohepatic or hepatoduodenal ligament lymphadenopathy. No intraperitoneal or retroperitoneal lymphadenopathy. Pelvic sidewalls obscured by streak artifact. Reproductive: Uterus largely obscured by streak artifact. There is no adnexal mass. Other: No substantial intraperitoneal free fluid although cul-de-sac is obscured. Musculoskeletal: Status post bilateral hip replacement. Bones are diffusely demineralized. Degenerative changes noted upper lumbar spine. IMPRESSION: 1. No acute findings in the chest, abdomen, or pelvis to explain the patient's history of fever. 2.  Bilateral pulmonary nodules measuring up to 6 mm. Non-contrast chest CT at 3-6 months is recommended. If the nodules are stable at time of repeat CT, then future CT at 18-24 months (from today's scan) is considered optional for low-risk patients, but is recommended for high-risk patients. This recommendation follows the consensus statement: Guidelines for Management of Incidental Pulmonary Nodules Detected on CT Images: From the Fleischner Society 2017; Radiology 2017; 284:228-243. 3. Large bilateral low-density renal lesions are likely cysts but have attenuation higher than would be expected for a simple cyst. These are probably cysts complicated by proteinaceous debris or hemorrhage. MRI without with contrast could be used to further evaluate if further characterization is warranted at this time. Alternatively, follow-up CT in 3-6 months could be used to ensure size stability. 4.  Aortic Atherosclerois (ICD10-170.0) 5. Bilateral hip replacement with streak artifact obscuring much of the inferior anatomic pelvis. Electronically Signed   By: Misty Stanley M.D.   On: 04/30/2017 14:33   Dg Chest Port 1 View  Result Date: 04/27/2017 CLINICAL DATA:  Dyspnea EXAM: PORTABLE CHEST 1 VIEW COMPARISON:  04/25/2017 FINDINGS: COPD. Mild bibasilar airspace disease has progressed in the interval. Negative for heart failure or edema. Small left effusion has developed in the interval. IMPRESSION: Interval development of mild bibasilar atelectasis/ infiltrate. Small left effusion without edema Electronically Signed   By: Franchot Gallo M.D.   On: 04/27/2017 09:45   US Abdomen Limited Ruq  Result Date: 04/27/2017 CLINICAL DATA:  Elevated alkaline phosphatase level. EXAM: ULTRASOUND ABDOMEN LIMITED RIGHT UPPER QUADRANT COMPARISON:  None.  FINDINGS: Gallbladder: No gallstones or wall thickening visualized. No sonographic Murphy sign noted by sonographer. Common bile duct: Diameter: 6.7 mm which is within normal limits. Liver:  No focal lesion identified. Within normal limits in parenchymal echogenicity. Portal vein is patent on color Doppler imaging with normal direction of blood flow towards the liver. IMPRESSION: No abnormality seen in the right upper quadrant of the abdomen. Electronically Signed   By: Marijo Conception, M.D.   On: 04/27/2017 16:52      Subjective: No concerns. No cough, chest pain, dyspnea, dysuria or flank pain  Discharge Exam: Vitals:   04/30/17 0508 04/30/17 1256  BP: 140/86 140/80  Pulse: 97 95  Resp: 18 18  Temp: 97.9 F (36.6 C) 98.1 F (36.7 C)  SpO2: 95% 97%   Vitals:   04/29/17 1246 04/29/17 2107 04/30/17 0508 04/30/17 1256  BP: 130/79 131/80 140/86 140/80  Pulse: 87 95 97 95  Resp: 16 17 18 18   Temp: (!) 97.5 F (36.4 C) 99.3 F (37.4 C) 97.9 F (36.6 C) 98.1 F (36.7 C)  TempSrc: Oral Oral Oral Oral  SpO2: 98% 94% 95% 97%  Weight:      Height:        General: Pt is alert, awake, not in acute distress Cardiovascular: RRR, S1/S2 +, no rubs, no gallops Respiratory: CTA bilaterally, no wheezing, no rhonchi Abdominal: Soft, NT, ND, bowel sounds + Extremities: mild upper extremity edema, no cyanosis    The results of significant diagnostics from this hospitalization (including imaging, microbiology, ancillary and laboratory) are listed below for reference.     Microbiology: Recent Results (from the past 240 hour(s))  Urine culture     Status: Abnormal   Collection Time: 04/25/17  8:30 AM  Result Value Ref Range Status   Specimen Description URINE, CLEAN CATCH  Final   Special Requests Normal  Final   Culture MULTIPLE SPECIES PRESENT, SUGGEST RECOLLECTION (A)  Final   Report Status 04/26/2017 FINAL  Final  MRSA PCR Screening     Status: None   Collection Time: 04/26/17  1:52 AM  Result Value Ref Range Status   MRSA by PCR NEGATIVE NEGATIVE Final    Comment:        The GeneXpert MRSA Assay (FDA approved for NASAL specimens only), is one component of  a comprehensive MRSA colonization surveillance program. It is not intended to diagnose MRSA infection nor to guide or monitor treatment for MRSA infections.   Culture, blood (routine x 2)     Status: None (Preliminary result)   Collection Time: 04/27/17  1:17 PM  Result Value Ref Range Status   Specimen Description BLOOD RIGHT ANTECUBITAL  Final   Special Requests IN PEDIATRIC BOTTLE Blood Culture adequate volume  Final   Culture NO GROWTH 3 DAYS  Final   Report Status PENDING  Incomplete  Culture, blood (routine x 2)     Status: None (Preliminary result)   Collection Time: 04/27/17  1:20 PM  Result Value Ref Range Status   Specimen Description BLOOD RIGHT WRIST  Final   Special Requests IN PEDIATRIC BOTTLE Blood Culture adequate volume  Final   Culture NO GROWTH 3 DAYS  Final   Report Status PENDING  Incomplete  Culture, Urine     Status: None   Collection Time: 04/27/17  3:00 PM  Result Value Ref Range Status   Specimen Description URINE, CLEAN CATCH  Final   Special Requests NONE  Final   Culture NO GROWTH  Final   Report Status 04/28/2017 FINAL  Final     Labs: BNP (last 3 results) Recent Labs    08/02/16 1602 04/25/17 0717  BNP 187.8* 710.6*   Basic Metabolic Panel: Recent Labs  Lab 04/26/17 0521 04/27/17 0528 04/28/17 0337 04/29/17 0725 04/30/17 0407  NA 134* 133* 132* 133* 131*  K 4.0 3.3* 3.2* 3.4* 3.6  CL 101 99* 98* 99* 97*  CO2 23 26 24 27 26   GLUCOSE 113* 112* 113* 100* 109*  BUN 20 21* 28* 28* 27*  CREATININE 1.15* 1.17* 1.36* 1.10* 1.03*  CALCIUM 8.7* 8.4* 8.4* 8.4* 8.6*  MG  --   --   --   --  1.6*   Liver Function Tests: Recent Labs  Lab 04/25/17 0720 04/27/17 0528 04/28/17 0337  AST 18 18 17   ALT 15 12* 10*  ALKPHOS 211* 214* 189*  BILITOT 1.4* 1.0 0.8  PROT 6.6 6.0* 5.7*  ALBUMIN 2.6* 2.2* 2.2*   No results for input(s): LIPASE, AMYLASE in the last 168 hours. No results for input(s): AMMONIA in the last 168 hours. CBC: Recent  Labs  Lab 04/25/17 0720 04/26/17 0521 04/27/17 1134 04/28/17 0337 04/29/17 0725 04/30/17 0738  WBC 12.0* 13.7* 19.8* 17.3* 16.1* 15.4*  NEUTROABS 8.2*  --   --   --   --   --   HGB 10.6* 10.7* 10.3* 9.8* 9.5* 10.2*  HCT 32.9* 34.2* 33.4* 31.8* 30.9* 32.8*  MCV 88.0 87.7 87.4 86.6 86.8 86.8  PLT 475* 480* 503* 428* 405* 487*   Cardiac Enzymes: No results for input(s): CKTOTAL, CKMB, CKMBINDEX, TROPONINI in the last 168 hours. BNP: Invalid input(s): POCBNP CBG: No results for input(s): GLUCAP in the last 168 hours. D-Dimer No results for input(s): DDIMER in the last 72 hours. Hgb A1c No results for input(s): HGBA1C in the last 72 hours. Lipid Profile No results for input(s): CHOL, HDL, LDLCALC, TRIG, CHOLHDL, LDLDIRECT in the last 72 hours. Thyroid function studies No results for input(s): TSH, T4TOTAL, T3FREE, THYROIDAB in the last 72 hours.  Invalid input(s): FREET3 Anemia work up No results for input(s): VITAMINB12, FOLATE, FERRITIN, TIBC, IRON, RETICCTPCT in the last 72 hours. Urinalysis    Component Value Date/Time   COLORURINE YELLOW 04/25/2017 0729   APPEARANCEUR HAZY (A) 04/25/2017 0729   LABSPEC 1.020 04/25/2017 0729   PHURINE 5.0 04/25/2017 0729   GLUCOSEU NEGATIVE 04/25/2017 0729   HGBUR SMALL (A) 04/25/2017 0729   BILIRUBINUR NEGATIVE 04/25/2017 0729   KETONESUR NEGATIVE 04/25/2017 0729   PROTEINUR NEGATIVE 04/25/2017 0729   UROBILINOGEN 0.2 10/24/2013 1644   NITRITE NEGATIVE 04/25/2017 0729   LEUKOCYTESUR LARGE (A) 04/25/2017 0729   Sepsis Labs Invalid input(s): PROCALCITONIN,  WBC,  LACTICIDVEN Microbiology Recent Results (from the past 240 hour(s))  Urine culture     Status: Abnormal   Collection Time: 04/25/17  8:30 AM  Result Value Ref Range Status   Specimen Description URINE, CLEAN CATCH  Final   Special Requests Normal  Final   Culture MULTIPLE SPECIES PRESENT, SUGGEST RECOLLECTION (A)  Final   Report Status 04/26/2017 FINAL  Final  MRSA  PCR Screening     Status: None   Collection Time: 04/26/17  1:52 AM  Result Value Ref Range Status   MRSA by PCR NEGATIVE NEGATIVE Final    Comment:        The GeneXpert MRSA Assay (FDA approved for NASAL specimens only), is one component of a comprehensive MRSA colonization surveillance program. It is  not intended to diagnose MRSA infection nor to guide or monitor treatment for MRSA infections.   Culture, blood (routine x 2)     Status: None (Preliminary result)   Collection Time: 04/27/17  1:17 PM  Result Value Ref Range Status   Specimen Description BLOOD RIGHT ANTECUBITAL  Final   Special Requests IN PEDIATRIC BOTTLE Blood Culture adequate volume  Final   Culture NO GROWTH 3 DAYS  Final   Report Status PENDING  Incomplete  Culture, blood (routine x 2)     Status: None (Preliminary result)   Collection Time: 04/27/17  1:20 PM  Result Value Ref Range Status   Specimen Description BLOOD RIGHT WRIST  Final   Special Requests IN PEDIATRIC BOTTLE Blood Culture adequate volume  Final   Culture NO GROWTH 3 DAYS  Final   Report Status PENDING  Incomplete  Culture, Urine     Status: None   Collection Time: 04/27/17  3:00 PM  Result Value Ref Range Status   Specimen Description URINE, CLEAN CATCH  Final   Special Requests NONE  Final   Culture NO GROWTH  Final   Report Status 04/28/2017 FINAL  Final     Time coordinating discharge: Over 30 minutes  SIGNED:   Cordelia Poche, MD [facility] 04/30/2017, 3:07 PM Pager [phone]  If 7PM-7AM, please contact night-coverage [email or web address] Password [email or web address]

## 2017-04-30 NOTE — Progress Notes (Signed)
Report called to [facility] at [place] [facility].Marland Kitchen

## 2017-04-30 NOTE — Discharge Instructions (Signed)
Martha Hendrix,  You were admitted for concern of a kidney infection. This was treated with antibiotics. You will be discharged on antibiotics. You will need to take one more dose. There was incidental findings of lung nodules. These can be followed up by your primary care physician. There was also an incidental finding of renal cysts. This can also be followed up by your primary care physician.

## 2017-04-30 NOTE — Progress Notes (Signed)
Pharmacy Antibiotic Note  Martha Hendrix is a 83 y.o. female admitted on 04/25/2017 with urosepsis.  Pharmacy managing vancomycin and cefepime dosing. On D#3 of abx. SCr down to baseline. WBC down to 16.1. Afebrile.  Vanc 11/14> Cefepime 11/14>    11/15 BCx>> ngtd  11/15 UCx>> negF  Vancomycin trough goal 15-20  Plan: 1) Vancomycin 750mg  IV q24. Consider stopping since culture negative at 48 hours  2) Cefepime 1g IV q24 3) Follow renal function, cultures, LOT, level if needed  Height: 5' 5.5" (166.4 cm) Weight: 145 lb (65.8 kg) IBW/kg (Calculated) : 58.15  Temp (24hrs), Avg:98.2 F (36.8 C), Min:97.5 F (36.4 C), Max:99.3 F (37.4 C)  Recent Labs  Lab 04/25/17 0720 04/25/17 0929 04/26/17 0521 04/27/17 0528 04/27/17 1134 04/28/17 0337 04/29/17 0725 04/30/17 0407  WBC 12.0*  --  13.7*  --  19.8* 17.3* 16.1*  --   CREATININE 1.29*  --  1.15* 1.17*  --  1.36* 1.10* 1.03*  LATICACIDVEN  --  1.49  --   --   --   --   --   --     Estimated Creatinine Clearance: 34.7 mL/min (A) (by C-G formula based on SCr of 1.03 mg/dL (H)).    Allergies  Allergen Reactions  . Codeine Nausea And Vomiting and Other (See Comments)    "spacy feeling"  . Hydrocodone Nausea And Vomiting    spacey feeling   . Celebrex [Celecoxib]     Thank you for allowing pharmacy to be a part of this patient's care.  Albertina Parr, PharmD., BCPS Clinical Pharmacist Pager [phone]

## 2017-05-01 DIAGNOSIS — R2681 Unsteadiness on feet: Secondary | ICD-10-CM | POA: Diagnosis not present

## 2017-05-01 DIAGNOSIS — N111 Chronic obstructive pyelonephritis: Secondary | ICD-10-CM | POA: Diagnosis not present

## 2017-05-01 DIAGNOSIS — R4189 Other symptoms and signs involving cognitive functions and awareness: Secondary | ICD-10-CM | POA: Diagnosis not present

## 2017-05-01 DIAGNOSIS — R601 Generalized edema: Secondary | ICD-10-CM | POA: Diagnosis not present

## 2017-05-01 DIAGNOSIS — R2689 Other abnormalities of gait and mobility: Secondary | ICD-10-CM | POA: Diagnosis not present

## 2017-05-01 DIAGNOSIS — R531 Weakness: Secondary | ICD-10-CM | POA: Diagnosis not present

## 2017-05-01 DIAGNOSIS — N398 Other specified disorders of urinary system: Secondary | ICD-10-CM | POA: Diagnosis not present

## 2017-05-01 DIAGNOSIS — M62561 Muscle wasting and atrophy, not elsewhere classified, right lower leg: Secondary | ICD-10-CM | POA: Diagnosis not present

## 2017-05-01 DIAGNOSIS — R278 Other lack of coordination: Secondary | ICD-10-CM | POA: Diagnosis not present

## 2017-05-01 DIAGNOSIS — A4189 Other specified sepsis: Secondary | ICD-10-CM | POA: Diagnosis not present

## 2017-05-01 DIAGNOSIS — N390 Urinary tract infection, site not specified: Secondary | ICD-10-CM | POA: Diagnosis not present

## 2017-05-01 DIAGNOSIS — M6259 Muscle wasting and atrophy, not elsewhere classified, multiple sites: Secondary | ICD-10-CM | POA: Diagnosis not present

## 2017-05-01 DIAGNOSIS — N393 Stress incontinence (female) (male): Secondary | ICD-10-CM | POA: Diagnosis not present

## 2017-05-01 DIAGNOSIS — M62562 Muscle wasting and atrophy, not elsewhere classified, left lower leg: Secondary | ICD-10-CM | POA: Diagnosis not present

## 2017-05-02 ENCOUNTER — Non-Acute Institutional Stay (SKILLED_NURSING_FACILITY): Payer: Medicare Other | Admitting: Internal Medicine

## 2017-05-02 ENCOUNTER — Encounter: Payer: Self-pay | Admitting: Internal Medicine

## 2017-05-02 DIAGNOSIS — N12 Tubulo-interstitial nephritis, not specified as acute or chronic: Secondary | ICD-10-CM

## 2017-05-02 DIAGNOSIS — K5901 Slow transit constipation: Secondary | ICD-10-CM

## 2017-05-02 DIAGNOSIS — M62561 Muscle wasting and atrophy, not elsewhere classified, right lower leg: Secondary | ICD-10-CM | POA: Diagnosis not present

## 2017-05-02 DIAGNOSIS — E876 Hypokalemia: Secondary | ICD-10-CM | POA: Diagnosis not present

## 2017-05-02 DIAGNOSIS — Q6102 Congenital multiple renal cysts: Secondary | ICD-10-CM | POA: Diagnosis not present

## 2017-05-02 DIAGNOSIS — F0390 Unspecified dementia without behavioral disturbance: Secondary | ICD-10-CM

## 2017-05-02 DIAGNOSIS — E43 Unspecified severe protein-calorie malnutrition: Secondary | ICD-10-CM

## 2017-05-02 DIAGNOSIS — I10 Essential (primary) hypertension: Secondary | ICD-10-CM

## 2017-05-02 DIAGNOSIS — R918 Other nonspecific abnormal finding of lung field: Secondary | ICD-10-CM

## 2017-05-02 DIAGNOSIS — M6259 Muscle wasting and atrophy, not elsewhere classified, multiple sites: Secondary | ICD-10-CM | POA: Diagnosis not present

## 2017-05-02 DIAGNOSIS — I480 Paroxysmal atrial fibrillation: Secondary | ICD-10-CM

## 2017-05-02 DIAGNOSIS — R2689 Other abnormalities of gait and mobility: Secondary | ICD-10-CM | POA: Diagnosis not present

## 2017-05-02 DIAGNOSIS — M62562 Muscle wasting and atrophy, not elsewhere classified, left lower leg: Secondary | ICD-10-CM | POA: Diagnosis not present

## 2017-05-02 DIAGNOSIS — G4700 Insomnia, unspecified: Secondary | ICD-10-CM

## 2017-05-02 DIAGNOSIS — M48061 Spinal stenosis, lumbar region without neurogenic claudication: Secondary | ICD-10-CM | POA: Diagnosis not present

## 2017-05-02 DIAGNOSIS — N393 Stress incontinence (female) (male): Secondary | ICD-10-CM | POA: Diagnosis not present

## 2017-05-02 DIAGNOSIS — F324 Major depressive disorder, single episode, in partial remission: Secondary | ICD-10-CM

## 2017-05-02 DIAGNOSIS — K2100 Gastro-esophageal reflux disease with esophagitis, without bleeding: Secondary | ICD-10-CM

## 2017-05-02 DIAGNOSIS — R748 Abnormal levels of other serum enzymes: Secondary | ICD-10-CM | POA: Diagnosis not present

## 2017-05-02 DIAGNOSIS — R278 Other lack of coordination: Secondary | ICD-10-CM | POA: Diagnosis not present

## 2017-05-02 LAB — CULTURE, BLOOD (ROUTINE X 2)
CULTURE: NO GROWTH
Culture: NO GROWTH
SPECIAL REQUESTS: ADEQUATE
Special Requests: ADEQUATE

## 2017-05-02 NOTE — Progress Notes (Signed)
Patient ID: Martha Hendrix, female   DOB: 12-02-1926, 83 y.o.   MRN: [id number]  Provider:  Rexene Edison. Mariea Clonts, D.O., C.M.D. Location:  [place] Room Number: 154 rehab Place of Service:  SNF (31)  PCP: Burnard Bunting, MD Patient Care Team: Burnard Bunting, MD as PCP - General (Internal Medicine)  Extended Emergency Contact Information Primary Emergency Contact: Colbert Ewing [place] Phone: [phone] Relation: Daughter Secondary Emergency Contact: Theodosia Paling [place] Phone: [phone] Relation: Son  Code Status: DNR Goals of Care: Advanced Directive information Advanced Directives 05/02/2017  Does Patient Have a Medical Advance Directive? Yes  Type of Advance Directive Out of facility DNR (pink MOST or yellow form);Healthcare Power of Attorney  Does patient want to make changes to medical advance directive? No - Patient declined  Copy of [place] in Chart? Yes  Would patient like information on creating a medical advance directive? -  Pre-existing out of facility DNR order (yellow form or pink MOST form) Yellow form placed in chart (order not valid for inpatient use)   Chief Complaint  Patient presents with  . New Admit To SNF    Rehab admission    HPI: Patient is a 83 y.o. female with h/o mild dementia, paroxysmal afib, osteoarthrtiis, GERD, osteoporosis, esophageal stricture, depression, lumbar spinal stenosis, HTN seen today for admission to [name] rehab s/p hospitalization from 11/13 to 11/18 for shortness of breath.  She was fatigued, sob and had a sharp, stabbing, left-sided pain worse with inspiration.  She had no cough, sputum productive, fever, abdominal pain, n/v, dysuria, frequency.  Her appetite was diminished for 2-3 days.  She had CVA tenderness. Labs in the ED revealed leukocytosis, hyponatremia, hypokalemia, and a dirty UA.  EKG showed afib at 95bpm.   CXR with a tiny pleural effusion vs atelectasis scarring left base.  D dimer was positive--pt adherent with xarelto anticoagulation for afib.  She was admitted for possible early sepsis secondary to pyelonephritis.  Her urine was sent for culture and she received rocephin and IVFs, potassium was corrected.  Blood cultures were also obtained due to sepsis that developed.  Her ceftriaxone was then escalated to vanc and cefepime due to T 102.1.  She was then transitioned to fosfomycin for 9 more days outpatient (had one dose before d/c).  Dementia:  On aricept.  Poor short term memory.  Son, Cherlynn Kaiser and daughter, Manuela Schwartz (OB/GYN) helped with history at hospital.  Getting ensure supplement at hospital twice a day b/w meals.  Another daughter was present today for the visit.  She was not managing well on her own. Intake has not been good.  Did have caregivers to assist with medications.  Hypokalemia:  Was repleted, but mag also low and repleted.  Afib with RVR: had not gotten her bystolic and resolved when resumed.  On xarelto.  Essential HTN;  On valsartan, hctz and bystolic.  At goal today.  Elevated alk phos:  RUQ [place] negative.  Trending down.    Left hand swelling/pitting edema:  felt to be due to IV infiltration, negative [place] for DVT.  Persists today and whole left side is more edematous than right.  Pulmonary nodules:  Incidentally found 2 51m nodules and noncontrast CT chest recommended in 3-6 mos.  Renal cysts:  Also incidental.  MRI was advised outpatient.    Depression:  On lexapro. Denies any depression or sadness.  Denies most everything though which is usual for  her per her daughter.    Constipation:  On colace.  Bowels moving with this and standing orders.    Glaucoma:  On latanoprost.  OA/spinal stenosis:  Using regular naproxen twice a day for pain.  Also has muscle relaxer as needed for muscle spasms.    GERD:  On protonix daily.  Denies acid reflux symptoms.  Insomnia:  Has prn 52m  ambien.  Has been sleeping well.    Past Medical History:  Diagnosis Date  . Acute blood loss anemia   . Adenomatous colon polyp   . Anemia    as a child and never had a transfusion  . Arthritis    osteo, takes naprosyn daily  . Atrial fibrillation ([place]   . Bruises easily    on both legs  . Cataract   . Chronic back pain    stenosis  . Congenital spondylolysis, lumbosacral region   . Depression    takes Lexapro daily  . Dysphagia   . Esophageal stricture   . GERD (gastroesophageal reflux disease)    takes Protonix daily  . Glaucoma    uses eye drops  . Headache(784.0)    occasionally  . History of bronchitis    last time unknown  . History of colon polyps   . Hypertension    takes Diovan and Bystolic daily  . Insomnia    takes Ambien nightly  . Joint pain   . Joint swelling   . Nocturia   . Osteoarthritis of left hip 2015   Status post left hip replacement 10/20/13  . PONV (postoperative nausea and vomiting)   . Senile osteoporosis   . Spinal stenosis of lumbar region    Past Surgical History:  Procedure Laterality Date  . APPENDECTOMY     as a child  . cataract surgery Left 2014  . COLONOSCOPY    . EPIDURAL BLOCK INJECTION    . ESOPHAGOGASTRODUODENOSCOPY     with ED  . JOINT REPLACEMENT Right 2001   right  . left arm surgery     d/t break as child  . LEFT TOTAL HIP ARTHROPLASTY ANTERIOR APPROACH Left 10/22/2013   [facility]  . LUMBAR LAMINECTOMY/DECOMPRESSION MICRODISCECTOMY LUMBAR THREE-FOUR,LUMBAR FOUR-FIVE Left 07/19/2013   [facility]  . TOTAL HIP ARTHROPLASTY Right     reports that  has never smoked. she has never used smokeless tobacco. She reports that she drinks alcohol. She reports that she does not use drugs. Social History   Socioeconomic History  . Marital status: Widowed    Spouse name: Not on file  . Number of children: Not on file  . Years of education: Not on file  .  Highest education level: Not on file  Social Needs  . Financial resource strain: Not on file  . Food insecurity - worry: Not on file  . Food insecurity - inability: Not on file  . Transportation needs - medical: Not on file  . Transportation needs - non-medical: Not on file  Occupational History  . Not on file  Tobacco Use  . Smoking status: Never Smoker  . Smokeless tobacco: Never Used  Substance and Sexual Activity  . Alcohol use: Yes    Comment: glass of wine every 3-462month . Drug use: No  . Sexual activity: Yes    Birth control/protection: Post-menopausal  Other Topics Concern  . Not on file  Social History Narrative  Patient is Widowed (2000). Lives in apartment,  Independent Living section at [facility] since 10/2013.   No Smoking history, minimal alcohol history    Continues to drive, attends social activities. Was active with water aerobics prior to hip pain(2014)   Patient has Advanced planning documents: HCPOA, Living Will             Functional Status Survey:    Family History  Family history unknown: Yes    Health Maintenance  Topic Date Due  . DEXA SCAN  08/17/1993  . PNA vac Low Risk Adult (1 of 2 - PCV13) 08/17/1993  . INFLUENZA VACCINE  01/11/2017  . TETANUS/TDAP  06/28/2023    Allergies  Allergen Reactions  . Codeine Nausea And Vomiting and Other (See Comments)    "spacy feeling"  . Hydrocodone Nausea And Vomiting    spacey feeling   . Celebrex [Celecoxib]     Outpatient Encounter Medications as of 05/02/2017  Medication Sig  . acetaminophen (TYLENOL) 325 MG tablet Take 650 mg by mouth every 4 (four) hours as needed.  Marland Kitchen aspirin EC 325 MG tablet Take 1 tablet (325 mg total) by mouth daily.  Marland Kitchen docusate sodium 100 MG CAPS Take 100 mg by mouth 2 (two) times daily.  Marland Kitchen donepezil (ARICEPT) 10 MG tablet Take 10 mg daily by mouth.  . escitalopram (LEXAPRO) 10 MG tablet Take 10 mg by mouth every morning.   . feeding supplement  (BOOST HIGH PROTEIN) LIQD Take 1 Container by mouth 2 (two) times daily between meals.  Derrill Memo ON 05/03/2017] fosfomycin (MONUROL) 3 g PACK Take 3 g once for 1 dose by mouth.  . latanoprost (XALATAN) 0.005 % ophthalmic solution Place 1 drop daily into both eyes.  . magnesium hydroxide (MILK OF MAGNESIA) 400 MG/5ML suspension Take 45 mLs by mouth daily as needed for mild constipation.  . nebivolol (BYSTOLIC) 10 MG tablet Take 1 tablet (10 mg total) daily by mouth.  . pantoprazole (PROTONIX) 40 MG tablet Take 40 mg by mouth daily.  . Rivaroxaban (XARELTO) 15 MG TABS tablet Take 15 mg by mouth daily.  Marland Kitchen tiZANidine (ZANAFLEX) 4 MG capsule Take 1 capsule (4 mg total) by mouth 3 (three) times daily as needed for muscle spasms.  . traMADol (ULTRAM) 50 MG tablet Take 50 mg by mouth 2 (two) times daily as needed.  . valsartan-hydrochlorothiazide (DIOVAN-HCT) 160-12.5 MG per tablet Take 1 tablet by mouth every morning.   . zolpidem (AMBIEN) 5 MG tablet Take 1 tablet (5 mg total) at bedtime as needed by mouth for sleep.  . [DISCONTINUED] feeding supplement, ENSURE COMPLETE, (ENSURE COMPLETE) LIQD Take 237 mLs by mouth 2 (two) times daily between meals. (Patient not taking: Reported on 05/25/2015)  . [DISCONTINUED] naproxen (NAPROSYN) 500 MG tablet Take 500 mg by mouth 2 (two) times daily with a meal.   No facility-administered encounter medications on file as of 05/02/2017.     Review of Systems  Constitutional: Positive for activity change, appetite change and fatigue. Negative for chills and fever.  HENT: Negative for congestion.   Eyes:       Glaucoma  Respiratory: Negative for chest tightness and shortness of breath.   Cardiovascular: Positive for leg swelling. Negative for chest pain and palpitations.       Left side greater than right edema, pitting 2+ since 11/7  Gastrointestinal: Positive for constipation. Negative for abdominal distention, blood in stool, diarrhea and nausea.    Genitourinary: Negative for decreased urine  volume, dysuria, flank pain and pelvic pain.       Pain seems higher than flank  Musculoskeletal: Positive for arthralgias, back pain and gait problem.       Left thoracic region tender and swollen appearing, has kyphoscoliosis also, difficulty dangling feet with therapy, able to sit up for me with help but more uncomfortable in that position; reports no improvement with tramadol use; not getting home naproxen--was held during hospitalization but renal function improving; using walker to ambulate and furniture surfed at home  Skin: Negative for color change.  Neurological: Positive for weakness. Negative for dizziness and speech difficulty.  Psychiatric/Behavioral: Positive for confusion. Negative for agitation and behavioral problems.    Vitals:   05/02/17 1033  BP: 118/80  Pulse: 90  Resp: (!) 21  Temp: (!) 97 F (36.1 C)  TempSrc: Oral  SpO2: 95%  Weight: 172 lb (78 kg)   Body mass index is 28.19 kg/m. Physical Exam  Constitutional: She appears well-developed and well-nourished. No distress.  HENT:  Head: Normocephalic and atraumatic.  Right Ear: External ear normal.  Left Ear: External ear normal.  Nose: Nose normal.  Mouth/Throat: Oropharynx is clear and moist.  Eyes: Conjunctivae and EOM are normal. Pupils are equal, round, and reactive to light.  Neck: Neck supple. No JVD present. No thyromegaly present.  Cardiovascular: Normal rate, regular rhythm, normal heart sounds and intact distal pulses.  Pulmonary/Chest: Effort normal and breath sounds normal. She has no rales.  Abdominal: Soft. Bowel sounds are normal. She exhibits no distension and no mass. There is no tenderness. There is no rebound and no guarding.  Musculoskeletal: Normal range of motion. She exhibits tenderness.  Requires help with positional changes due to weakness and back pain; tender over left thoracic region posteriorly and bulging area there, no lumbar spine  tenderness, no true flank tenderness  Lymphadenopathy:    She has no cervical adenopathy.  Neurological: She is alert. No cranial nerve deficit.  Oriented to person and place, not time, poor historian--denies everything  Skin: Skin is warm and dry.  Psychiatric: She has a normal mood and affect.    Labs reviewed: Basic Metabolic Panel: Recent Labs    04/28/17 0337 04/29/17 0725 04/30/17 0407  NA 132* 133* 131*  K 3.2* 3.4* 3.6  CL 98* 99* 97*  CO2 _0 GLUCOSE 113* 100* 109*  BUN 28* 28* 27*  CREATININE 1.36* 1.10* 1.03*  CALCIUM 8.4* 8.4* 8.6*  MG  --   --  1.6*   Liver Function Tests: Recent Labs    04/25/17 0720 04/27/17 0528 04/28/17 0337  AST _1 ALT 15 12* 10*  ALKPHOS 211* 214* 189*  BILITOT 1.4* 1.0 0.8  PROT 6.6 6.0* 5.7*  ALBUMIN 2.6* 2.2* 2.2*   No results for input(s): LIPASE, AMYLASE in the last 8760 hours. No results for input(s): AMMONIA in the last 8760 hours. CBC: Recent Labs    08/02/16 1549 04/25/17 0720  04/28/17 0337 04/29/17 0725 04/30/17 0738  WBC 5.8 12.0*   < > 17.3* 16.1* 15.4*  NEUTROABS 3.3 8.2*  --   --   --   --   HGB 14.3 10.6*   < > 9.8* 9.5* 10.2*  HCT 42.7 32.9*   < > 31.8* 30.9* 32.8*  MCV 90.7 88.0   < > 86.6 86.8 86.8  PLT 209 475*   < > 428* 405* 487*   < > = values in this interval not displayed.  Cardiac Enzymes: No results for input(s): CKTOTAL, CKMB, CKMBINDEX, TROPONINI in the last 8760 hours. BNP: Invalid input(s): POCBNP No results found for: HGBA1C No results found for: TSH No results found for: VITAMINB12 No results found for: FOLATE No results found for: IRON, TIBC, FERRITIN  Imaging and Procedures obtained prior to SNF admission: Dg Chest 2 View  Result Date: 04/25/2017 CLINICAL DATA:  Shortness of breath.  Left flank pain. EXAM: CHEST  2 VIEW COMPARISON:  08/02/2016 . FINDINGS: Mediastinum and hilar structures normal. Cardiomegaly with normal pulmonary vascularity. Mild basilar  atelectasis and/or scarring. Tiny left pleural effusion cannot be excluded. No pneumothorax. Diffuse thoracic spine osteopenia degenerative change . IMPRESSION: Mild basilar atelectasis and/or scarring . Tiny left pleural effusion cannot be excluded. No pneumothorax. Electronically Signed   By: Marcello Moores  Register   On: 04/25/2017 07:55    Assessment/Plan 1. Pyelonephritis -finishing off fosfomycin abx -afebrile -f/u labs and monitor clinically -having increased left thoracic pain now worse when sitting up, but off usual pain medication--tramadol not helping her  2. Dementia without behavioral disturbance, unspecified dementia type -moderate it seems, could be worse at present due to infection, MMSE still pending, continues on aricept -will need health care environment after rehab  3. Hypokalemia -f/u cmp as planned, was repleted along with mag in hospital  4. PAF (paroxysmal atrial fibrillation) (HCC) -cont bystolic for rate control, full dose asa  5. Essential hypertension -bp at goal with current therapy, back on arb/diuretic combination--discussed reasons meds were held and impact on kidneys of these and the nsaids--her daughter expressed understanding, but we all agreed we want her comfortable  6. Elevated alkaline phosphatase level -improving, USneg, f/u cmp  7. Edema due to malnutrition, due to unspecified malnutrition type ([place]) -not [place] well in [place], suspect due to low albumin and intake, encourage po here and add supplement if not taking adequate food and drink  8. Indeterminate pulmonary nodules -pt also has had small pleural effusion on left--further outpatient workup was recommended if family chooses to pursue this upon recovery from pyelonephritis, but pt with moderate dementia needing a transfer to healthcare from home [place] environment  9. Multiple renal cysts -for MRI outpatient when able to get out and about after rehab if family chooses to investigate depending on how  she does here  53. Major depressive disorder with single episode, in partial remission ([place]) -cont home lexapro  11. Gastroesophageal reflux disease with esophagitis -cont protonix therapy, has also had dysphagia and stricture in the past, but no recent known choking episodes of difficulty swallowing food  12. Spinal stenosis of lumbar region without neurogenic claudication -suspect this and her degenerative changes in her thoracic spine plus scoliosis are causing an increase in her back pain--seems higher up than flank pain and positional with sitting up -has been off naproxen, will resume with food to see if it helps and repeat cmp as planned  13. Slow transit constipation -cont stool softener and standing orders if no bm  14. Insomnia, unspecified type -cont prn ambien, if not used, d/c  F/u with Dr. Reynaldo Minium upon rehab discharge within 1-2 wks for [facility] visit.  Family/ staff Communication: discussed with her daughter at bedside, rehab nurse  Labs/tests ordered:  Cbc, cmp, mag at one week from admission  [place]. Shaleta Ruacho, D.O. [facility] [address], [place] [address] Cell Phone (Mon-Fri 8am-5pm):  [phone] On Call:  [phone] & follow prompts after 5pm & weekends Office  Phone:  [phone] Office Fax:  [phone]

## 2017-05-03 DIAGNOSIS — M6259 Muscle wasting and atrophy, not elsewhere classified, multiple sites: Secondary | ICD-10-CM | POA: Diagnosis not present

## 2017-05-03 DIAGNOSIS — R278 Other lack of coordination: Secondary | ICD-10-CM | POA: Diagnosis not present

## 2017-05-03 DIAGNOSIS — R2689 Other abnormalities of gait and mobility: Secondary | ICD-10-CM | POA: Diagnosis not present

## 2017-05-03 DIAGNOSIS — N393 Stress incontinence (female) (male): Secondary | ICD-10-CM | POA: Diagnosis not present

## 2017-05-03 DIAGNOSIS — M62561 Muscle wasting and atrophy, not elsewhere classified, right lower leg: Secondary | ICD-10-CM | POA: Diagnosis not present

## 2017-05-03 DIAGNOSIS — M62562 Muscle wasting and atrophy, not elsewhere classified, left lower leg: Secondary | ICD-10-CM | POA: Diagnosis not present

## 2017-05-05 DIAGNOSIS — M62562 Muscle wasting and atrophy, not elsewhere classified, left lower leg: Secondary | ICD-10-CM | POA: Diagnosis not present

## 2017-05-05 DIAGNOSIS — M6259 Muscle wasting and atrophy, not elsewhere classified, multiple sites: Secondary | ICD-10-CM | POA: Diagnosis not present

## 2017-05-05 DIAGNOSIS — R2689 Other abnormalities of gait and mobility: Secondary | ICD-10-CM | POA: Diagnosis not present

## 2017-05-05 DIAGNOSIS — N393 Stress incontinence (female) (male): Secondary | ICD-10-CM | POA: Diagnosis not present

## 2017-05-05 DIAGNOSIS — R278 Other lack of coordination: Secondary | ICD-10-CM | POA: Diagnosis not present

## 2017-05-05 DIAGNOSIS — M62561 Muscle wasting and atrophy, not elsewhere classified, right lower leg: Secondary | ICD-10-CM | POA: Diagnosis not present

## 2017-05-06 DIAGNOSIS — R278 Other lack of coordination: Secondary | ICD-10-CM | POA: Diagnosis not present

## 2017-05-06 DIAGNOSIS — M6259 Muscle wasting and atrophy, not elsewhere classified, multiple sites: Secondary | ICD-10-CM | POA: Diagnosis not present

## 2017-05-06 DIAGNOSIS — M62562 Muscle wasting and atrophy, not elsewhere classified, left lower leg: Secondary | ICD-10-CM | POA: Diagnosis not present

## 2017-05-06 DIAGNOSIS — R2689 Other abnormalities of gait and mobility: Secondary | ICD-10-CM | POA: Diagnosis not present

## 2017-05-06 DIAGNOSIS — N393 Stress incontinence (female) (male): Secondary | ICD-10-CM | POA: Diagnosis not present

## 2017-05-06 DIAGNOSIS — M62561 Muscle wasting and atrophy, not elsewhere classified, right lower leg: Secondary | ICD-10-CM | POA: Diagnosis not present

## 2017-05-08 ENCOUNTER — Encounter: Payer: Self-pay | Admitting: Adult Health

## 2017-05-08 ENCOUNTER — Non-Acute Institutional Stay (SKILLED_NURSING_FACILITY): Payer: Medicare Other | Admitting: Adult Health

## 2017-05-08 DIAGNOSIS — D72825 Bandemia: Secondary | ICD-10-CM | POA: Diagnosis not present

## 2017-05-08 DIAGNOSIS — D72829 Elevated white blood cell count, unspecified: Secondary | ICD-10-CM | POA: Diagnosis not present

## 2017-05-08 DIAGNOSIS — H409 Unspecified glaucoma: Secondary | ICD-10-CM | POA: Diagnosis not present

## 2017-05-08 DIAGNOSIS — N10 Acute tubulo-interstitial nephritis: Secondary | ICD-10-CM | POA: Diagnosis not present

## 2017-05-08 DIAGNOSIS — G8929 Other chronic pain: Secondary | ICD-10-CM | POA: Diagnosis not present

## 2017-05-08 DIAGNOSIS — F0390 Unspecified dementia without behavioral disturbance: Secondary | ICD-10-CM

## 2017-05-08 DIAGNOSIS — F309 Manic episode, unspecified: Secondary | ICD-10-CM | POA: Diagnosis not present

## 2017-05-08 DIAGNOSIS — N393 Stress incontinence (female) (male): Secondary | ICD-10-CM | POA: Diagnosis not present

## 2017-05-08 DIAGNOSIS — I4891 Unspecified atrial fibrillation: Secondary | ICD-10-CM | POA: Diagnosis not present

## 2017-05-08 DIAGNOSIS — I10 Essential (primary) hypertension: Secondary | ICD-10-CM | POA: Diagnosis not present

## 2017-05-08 DIAGNOSIS — M62561 Muscle wasting and atrophy, not elsewhere classified, right lower leg: Secondary | ICD-10-CM | POA: Diagnosis not present

## 2017-05-08 DIAGNOSIS — M62562 Muscle wasting and atrophy, not elsewhere classified, left lower leg: Secondary | ICD-10-CM | POA: Diagnosis not present

## 2017-05-08 DIAGNOSIS — E860 Dehydration: Secondary | ICD-10-CM | POA: Diagnosis not present

## 2017-05-08 DIAGNOSIS — R278 Other lack of coordination: Secondary | ICD-10-CM | POA: Diagnosis not present

## 2017-05-08 DIAGNOSIS — E039 Hypothyroidism, unspecified: Secondary | ICD-10-CM | POA: Diagnosis not present

## 2017-05-08 DIAGNOSIS — I480 Paroxysmal atrial fibrillation: Secondary | ICD-10-CM | POA: Diagnosis not present

## 2017-05-08 DIAGNOSIS — Z7189 Other specified counseling: Secondary | ICD-10-CM

## 2017-05-08 DIAGNOSIS — D649 Anemia, unspecified: Secondary | ICD-10-CM | POA: Diagnosis not present

## 2017-05-08 DIAGNOSIS — M6259 Muscle wasting and atrophy, not elsewhere classified, multiple sites: Secondary | ICD-10-CM | POA: Diagnosis not present

## 2017-05-08 DIAGNOSIS — N179 Acute kidney failure, unspecified: Secondary | ICD-10-CM | POA: Diagnosis not present

## 2017-05-08 DIAGNOSIS — M545 Low back pain, unspecified: Secondary | ICD-10-CM

## 2017-05-08 DIAGNOSIS — G309 Alzheimer's disease, unspecified: Secondary | ICD-10-CM | POA: Diagnosis not present

## 2017-05-08 DIAGNOSIS — Z79899 Other long term (current) drug therapy: Secondary | ICD-10-CM | POA: Diagnosis not present

## 2017-05-08 DIAGNOSIS — R2689 Other abnormalities of gait and mobility: Secondary | ICD-10-CM | POA: Diagnosis not present

## 2017-05-08 LAB — BASIC METABOLIC PANEL
BUN: 90 — AB (ref 4–21)
Creatinine: 4.3 — AB (ref 0.5–1.1)
Glucose: 87
Potassium: 5.6 — AB (ref 3.4–5.3)
SODIUM: 135 — AB (ref 137–147)

## 2017-05-08 LAB — CBC AND DIFFERENTIAL
HCT: 33 — AB (ref 36–46)
Hemoglobin: 10.5 — AB (ref 12.0–16.0)
Platelets: 635 — AB (ref 150–399)
WBC: 39.3

## 2017-05-08 LAB — HEPATIC FUNCTION PANEL
ALK PHOS: 343 — AB (ref 25–125)
ALT: 12 (ref 7–35)
AST: 14 (ref 13–35)
BILIRUBIN, TOTAL: 0.7

## 2017-05-08 NOTE — Progress Notes (Signed)
Location:  [facility]upational psychologist of Service:  SNF (31) Provider:   Cindi Carbon, [place] [phone]   Burnard Bunting, MD  Patient Care Team: Burnard Bunting, MD as PCP - General (Internal Medicine) Gayland Curry, DO as Consulting Physician (Geriatric Medicine)  Extended Emergency Contact Information Primary Emergency Contact: Martha Hendrix [place] of [place] Phone: [phone] Relation: Daughter Secondary Emergency Contact: Martha Hendrix [place] of [place] Phone: [phone] Relation: Son  Code Status:  DNR Goals of care: Advanced Directive information Advanced Directives 05/02/2017  Does Patient Have a Medical Advance Directive? Yes  Type of Advance Directive Out of facility DNR (pink MOST or yellow form);Healthcare Power of Attorney  Does patient want to make changes to medical advance directive? No - Patient declined  Copy of [place] in Chart? Yes  Would patient like information on creating a medical advance directive? -  Pre-existing out of facility DNR order (yellow form or pink MOST form) Yellow form placed in chart (order not valid for inpatient use)     Chief Complaint  Patient presents with  . Acute Visit    acute renal failure     HPI:  Pt is a 83 y.o. female seen today for an acute visit for acute renal failure and leukocytosis. She as discharged from the hospital to rehab at [facility] on 04/30/17 with a diagnosis of sepsis and pyelonephritis. She was treated with ceftriaxone and later vancomycin and cefepime. Later changed to fosfomycin. The presumed source was urinary as she had low back and flank pain but culture data was not revealing. CT of the abd and pelvis was unremarkable for the source of infection. Renal cysts were noted on the right measuring 3.9 cm and 4.2 cm on the left. These were not felt to be obstructive but outpt MRI was suggested.  Routine labs  returned 05/08/2017 with a Cr of 4.3, which was 1.03 at discharge. WBC was 39, which was 15.4 at discharge.  Martha Hendrix has no pain for my visit. She has mild shortness breath at rest which is slightly worse from her baseline per her daughter who is at the bedside. Apparently she has been declining and was weaker and more short of breath over time. She has advancing dementia and was not going to be able to return home to independent living from rehab.  Her family has requested that I not send her to the hospital but rather consult with hospice.  She had been on naproxen for back pain which was present prior to her hospital admission.   Past Medical History:  Diagnosis Date  . Acute blood loss anemia   . Adenomatous colon polyp   . Anemia    as a child and never had a transfusion  . Arthritis    osteo, takes naprosyn daily  . Atrial fibrillation ([place])   . Bruises easily    on both legs  . Cataract   . Chronic back pain    stenosis  . Congenital spondylolysis, lumbosacral region   . Depression    takes Lexapro daily  . Dysphagia   . Esophageal stricture   . GERD (gastroesophageal reflux disease)    takes Protonix daily  . Glaucoma    uses eye drops  . Headache(784.0)    occasionally  . History of bronchitis    last time unknown  . History of colon polyps   . Hypertension    takes Diovan and  Bystolic daily  . Insomnia    takes Ambien nightly  . Joint pain   . Joint swelling   . Nocturia   . Osteoarthritis of left hip 2015   Status post left hip replacement 10/20/13  . PONV (postoperative nausea and vomiting)   . Senile osteoporosis   . Spinal stenosis of lumbar region    Past Surgical History:  Procedure Laterality Date  . APPENDECTOMY     as a child  . cataract surgery Left 2014  . COLONOSCOPY    . EPIDURAL BLOCK INJECTION    . ESOPHAGOGASTRODUODENOSCOPY     with ED  . JOINT REPLACEMENT Right 2001   right  . left arm surgery     d/t break as child  .  LUMBAR LAMINECTOMY/DECOMPRESSION MICRODISCECTOMY Left 07/19/2013   Procedure: LUMBAR LAMINECTOMY/DECOMPRESSION MICRODISCECTOMY LUMBAR THREE-FOUR,LUMBAR FOUR-FIVE;  Surgeon: Eustace Moore, MD;  Location: [facility];  Service: Neurosurgery;  Laterality: Left;  . TOTAL HIP ARTHROPLASTY Right   . TOTAL HIP ARTHROPLASTY Left 10/22/2013   Procedure: LEFT TOTAL HIP ARTHROPLASTY ANTERIOR APPROACH;  Surgeon: Mauri Pole, MD;  Location: [facility];  Service: Orthopedics;  Laterality: Left;    Allergies  Allergen Reactions  . Codeine Nausea And Vomiting and Other (See Comments)    "spacy feeling"  . Hydrocodone Nausea And Vomiting    spacey feeling   . Celebrex [Celecoxib]     Outpatient Encounter Medications as of 05/08/2017  Medication Sig  . acetaminophen (TYLENOL) 325 MG tablet Take 650 mg by mouth every 4 (four) hours as needed.  Marland Kitchen aspirin EC 325 MG tablet Take 1 tablet (325 mg total) by mouth daily.  Marland Kitchen docusate sodium 100 MG CAPS Take 100 mg by mouth 2 (two) times daily.  Marland Kitchen donepezil (ARICEPT) 10 MG tablet Take 10 mg daily by mouth.  . escitalopram (LEXAPRO) 10 MG tablet Take 10 mg by mouth every morning.   . feeding supplement (BOOST HIGH PROTEIN) LIQD Take 1 Container by mouth 2 (two) times daily between meals.  . latanoprost (XALATAN) 0.005 % ophthalmic solution Place 1 drop daily into both eyes.  . magnesium hydroxide (MILK OF MAGNESIA) 400 MG/5ML suspension Take 45 mLs by mouth daily as needed for mild constipation.  . nebivolol (BYSTOLIC) 10 MG tablet Take 1 tablet (10 mg total) daily by mouth.  . pantoprazole (PROTONIX) 40 MG tablet Take 40 mg by mouth daily.  . Rivaroxaban (XARELTO) 15 MG TABS tablet Take 15 mg by mouth daily.  Marland Kitchen tiZANidine (ZANAFLEX) 4 MG capsule Take 1 capsule (4 mg total) by mouth 3 (three) times daily as needed for muscle spasms.  . traMADol (ULTRAM) 50 MG tablet Take 25 mg by mouth 2 (two) times daily as needed.   . valsartan-hydrochlorothiazide (DIOVAN-HCT)  160-12.5 MG per tablet Take 1 tablet by mouth every morning.   . zolpidem (AMBIEN) 5 MG tablet Take 1 tablet (5 mg total) at bedtime as needed by mouth for sleep.   No facility-administered encounter medications on file as of 05/08/2017.     Review of Systems  Constitutional: Negative for activity change, appetite change, chills, diaphoresis, fatigue, fever and unexpected weight change.  HENT: Negative for congestion.   Respiratory: Positive for shortness of breath. Negative for cough and wheezing.   Cardiovascular: Positive for leg swelling. Negative for chest pain and palpitations.  Gastrointestinal: Negative for abdominal distention, abdominal pain, constipation and diarrhea.  Genitourinary: Positive for decreased urine volume and flank pain (not present during exam  but reported earlier in the day). Negative for difficulty urinating and dysuria.  Musculoskeletal: Positive for arthralgias and back pain. Negative for gait problem, joint swelling and myalgias.  Neurological: Positive for weakness. Negative for dizziness, tremors, seizures, syncope, facial asymmetry, speech difficulty, light-headedness, numbness and headaches.  Psychiatric/Behavioral: Positive for confusion. Negative for agitation and behavioral problems.    Immunization History  Administered Date(s) Administered  . Pneumococcal Polysaccharide-23 10/02/2011  . Td 06/27/2013  . Tdap 12/10/2012  . Zoster 02/23/2004   Pertinent  Health Maintenance Due  Topic Date Due  . DEXA SCAN  08/17/1993  . PNA vac Low Risk Adult (2 of 2 - PCV13) 10/01/2012  . INFLUENZA VACCINE  01/11/2017   No flowsheet data found. Functional Status Survey:    Vitals:   05/08/17 1503  BP: 102/72  Pulse: 80  Resp: 20  Temp: (!) 97.2 F (36.2 C)  SpO2: 92%   There is no height or weight on file to calculate BMI. Physical Exam  Constitutional: No distress.  HENT:  Head: Normocephalic and atraumatic.  Nose: Nose normal.  Mouth/Throat:  Oropharynx is clear and moist. No oropharyngeal exudate.  Eyes: Conjunctivae and EOM are normal. Pupils are equal, round, and reactive to light. Right eye exhibits no discharge. Left eye exhibits no discharge.  Neck: No JVD present.  Cardiovascular: Normal rate.  No murmur heard. irregular  Pulmonary/Chest: Effort normal and breath sounds normal. No respiratory distress. She has no wheezes.  Abdominal: Soft. Bowel sounds are normal. She exhibits no distension. There is tenderness (suprapubic ).  No cva tenderness. Rotund abd.  Neurological: She is alert.  Oriented to self and place  Skin: Skin is warm and dry. She is not diaphoretic.  Psychiatric: She has a normal mood and affect.    Labs reviewed: Recent Labs    04/28/17 0337 04/29/17 0725 04/30/17 0407 05/08/17  NA 132* 133* 131* 135*  K 3.2* 3.4* 3.6 5.6*  CL 98* 99* 97*  --   CO2 24 27 26   --   GLUCOSE 113* 100* 109*  --   BUN 28* 28* 27* 90*  CREATININE 1.36* 1.10* 1.03* 4.3*  CALCIUM 8.4* 8.4* 8.6*  --   MG  --   --  1.6*  --    Recent Labs    04/25/17 0720 04/27/17 0528 04/28/17 0337 05/08/17  AST 18 18 17 14   ALT 15 12* 10* 12  ALKPHOS 211* 214* 189* 343*  BILITOT 1.4* 1.0 0.8  --   PROT 6.6 6.0* 5.7*  --   ALBUMIN 2.6* 2.2* 2.2*  --    Recent Labs    08/02/16 1549 04/25/17 0720  04/28/17 0337 04/29/17 0725 04/30/17 0738 05/08/17  WBC 5.8 12.0*   < > 17.3* 16.1* 15.4* 39.3  NEUTROABS 3.3 8.2*  --   --   --   --   --   HGB 14.3 10.6*   < > 9.8* 9.5* 10.2* 10.5*  HCT 42.7 32.9*   < > 31.8* 30.9* 32.8* 33*  MCV 90.7 88.0   < > 86.6 86.8 86.8  --   PLT 209 475*   < > 428* 405* 487* 635*   < > = values in this interval not displayed.   No results found for: TSH No results found for: HGBA1C No results found for: CHOL, HDL, LDLCALC, LDLDIRECT, TRIG, CHOLHDL  Significant Diagnostic Results in last 30 days:  Dg Chest 2 View  Result Date: 04/25/2017 CLINICAL DATA:  Shortness of  breath.  Left flank pain.  EXAM: CHEST  2 VIEW COMPARISON:  08/02/2016 . FINDINGS: Mediastinum and hilar structures normal. Cardiomegaly with normal pulmonary vascularity. Mild basilar atelectasis and/or scarring. Tiny left pleural effusion cannot be excluded. No pneumothorax. Diffuse thoracic spine osteopenia degenerative change . IMPRESSION: Mild basilar atelectasis and/or scarring . Tiny left pleural effusion cannot be excluded. No pneumothorax. Electronically Signed   By: Marcello Moores  Register   On: 04/25/2017 07:55   Ct Chest W Contrast  Result Date: 04/30/2017 CLINICAL DATA:  Fever of unknown origin. EXAM: CT CHEST, ABDOMEN, AND PELVIS WITH CONTRAST TECHNIQUE: Multidetector CT imaging of the chest, abdomen and pelvis was performed following the standard protocol during bolus administration of intravenous contrast. CONTRAST:  146mL ISOVUE-300 IOPAMIDOL (ISOVUE-300) INJECTION 61% COMPARISON:  None. FINDINGS: CT CHEST FINDINGS Cardiovascular: Heart size upper normal. No substantial pericardial effusion. No thoracic aortic aneurysm. Mediastinum/Nodes: 11 mm short axis subcarinal lymph node is upper normal. There is no hilar lymphadenopathy. The esophagus has normal imaging features. There is no axillary lymphadenopathy. Lungs/Pleura: 6 mm right middle lobe pulmonary nodule seen image 85 series 4. Subsegmental atelectasis evident in both lower lobes. 6 mm left lower lobe pulmonary nodule visible on image 78 series 4. No pulmonary edema. Small left pleural effusion noted. Musculoskeletal: Mild superior endplate compression deformity is identified at T4. CT ABDOMEN PELVIS FINDINGS Hepatobiliary: No focal abnormality within the liver parenchyma. There is no evidence for gallstones, gallbladder wall thickening, or pericholecystic fluid. No intrahepatic or extrahepatic biliary dilation. Pancreas: No focal mass lesion. No dilatation of the main duct. No intraparenchymal cyst. No peripancreatic edema. Spleen: No splenomegaly. No focal mass lesion.  Adrenals/Urinary Tract: No adrenal nodule or mass. 3.9 cm homogeneous low-density lesion lower pole right kidney is well-defined and has attenuation slightly higher than would be expected for a simple cyst. 4.2 cm homogeneous exophytic lesion lower pole left kidney has similar attenuation. No evidence for hydroureter. Bladder is obscured by streak artifact from bilateral hip replacement. Stomach/Bowel: Stomach is nondistended. No gastric wall thickening. No evidence of outlet obstruction. Duodenum is normally positioned as is the ligament of Treitz. No small bowel wall thickening. No small bowel dilatation. The terminal ileum is normal. The appendix is not visualized, but there is no edema or inflammation in the region of the cecum. Colon unremarkable. Vascular/Lymphatic: There is abdominal aortic atherosclerosis without aneurysm. There is no gastrohepatic or hepatoduodenal ligament lymphadenopathy. No intraperitoneal or retroperitoneal lymphadenopathy. Pelvic sidewalls obscured by streak artifact. Reproductive: Uterus largely obscured by streak artifact. There is no adnexal mass. Other: No substantial intraperitoneal free fluid although cul-de-sac is obscured. Musculoskeletal: Status post bilateral hip replacement. Bones are diffusely demineralized. Degenerative changes noted upper lumbar spine. IMPRESSION: 1. No acute findings in the chest, abdomen, or pelvis to explain the patient's history of fever. 2. Bilateral pulmonary nodules measuring up to 6 mm. Non-contrast chest CT at 3-6 months is recommended. If the nodules are stable at time of repeat CT, then future CT at 18-24 months (from today's scan) is considered optional for low-risk patients, but is recommended for high-risk patients. This recommendation follows the consensus statement: Guidelines for Management of Incidental Pulmonary Nodules Detected on CT Images: From the Fleischner Society 2017; Radiology 2017; 284:228-243. 3. Large bilateral low-density  renal lesions are likely cysts but have attenuation higher than would be expected for a simple cyst. These are probably cysts complicated by proteinaceous debris or hemorrhage. MRI without with contrast could be used to further evaluate if further characterization is  warranted at this time. Alternatively, follow-up CT in 3-6 months could be used to ensure size stability. 4.  Aortic Atherosclerois (ICD10-170.0) 5. Bilateral hip replacement with streak artifact obscuring much of the inferior anatomic pelvis. Electronically Signed   By: Misty Stanley M.D.   On: 04/30/2017 14:33   Ct Abdomen Pelvis W Contrast  Result Date: 04/30/2017 CLINICAL DATA:  Fever of unknown origin. EXAM: CT CHEST, ABDOMEN, AND PELVIS WITH CONTRAST TECHNIQUE: Multidetector CT imaging of the chest, abdomen and pelvis was performed following the standard protocol during bolus administration of intravenous contrast. CONTRAST:  178mL ISOVUE-300 IOPAMIDOL (ISOVUE-300) INJECTION 61% COMPARISON:  None. FINDINGS: CT CHEST FINDINGS Cardiovascular: Heart size upper normal. No substantial pericardial effusion. No thoracic aortic aneurysm. Mediastinum/Nodes: 11 mm short axis subcarinal lymph node is upper normal. There is no hilar lymphadenopathy. The esophagus has normal imaging features. There is no axillary lymphadenopathy. Lungs/Pleura: 6 mm right middle lobe pulmonary nodule seen image 85 series 4. Subsegmental atelectasis evident in both lower lobes. 6 mm left lower lobe pulmonary nodule visible on image 78 series 4. No pulmonary edema. Small left pleural effusion noted. Musculoskeletal: Mild superior endplate compression deformity is identified at T4. CT ABDOMEN PELVIS FINDINGS Hepatobiliary: No focal abnormality within the liver parenchyma. There is no evidence for gallstones, gallbladder wall thickening, or pericholecystic fluid. No intrahepatic or extrahepatic biliary dilation. Pancreas: No focal mass lesion. No dilatation of the main duct.  No intraparenchymal cyst. No peripancreatic edema. Spleen: No splenomegaly. No focal mass lesion. Adrenals/Urinary Tract: No adrenal nodule or mass. 3.9 cm homogeneous low-density lesion lower pole right kidney is well-defined and has attenuation slightly higher than would be expected for a simple cyst. 4.2 cm homogeneous exophytic lesion lower pole left kidney has similar attenuation. No evidence for hydroureter. Bladder is obscured by streak artifact from bilateral hip replacement. Stomach/Bowel: Stomach is nondistended. No gastric wall thickening. No evidence of outlet obstruction. Duodenum is normally positioned as is the ligament of Treitz. No small bowel wall thickening. No small bowel dilatation. The terminal ileum is normal. The appendix is not visualized, but there is no edema or inflammation in the region of the cecum. Colon unremarkable. Vascular/Lymphatic: There is abdominal aortic atherosclerosis without aneurysm. There is no gastrohepatic or hepatoduodenal ligament lymphadenopathy. No intraperitoneal or retroperitoneal lymphadenopathy. Pelvic sidewalls obscured by streak artifact. Reproductive: Uterus largely obscured by streak artifact. There is no adnexal mass. Other: No substantial intraperitoneal free fluid although cul-de-sac is obscured. Musculoskeletal: Status post bilateral hip replacement. Bones are diffusely demineralized. Degenerative changes noted upper lumbar spine. IMPRESSION: 1. No acute findings in the chest, abdomen, or pelvis to explain the patient's history of fever. 2. Bilateral pulmonary nodules measuring up to 6 mm. Non-contrast chest CT at 3-6 months is recommended. If the nodules are stable at time of repeat CT, then future CT at 18-24 months (from today's scan) is considered optional for low-risk patients, but is recommended for high-risk patients. This recommendation follows the consensus statement: Guidelines for Management of Incidental Pulmonary Nodules Detected on CT  Images: From the Fleischner Society 2017; Radiology 2017; 284:228-243. 3. Large bilateral low-density renal lesions are likely cysts but have attenuation higher than would be expected for a simple cyst. These are probably cysts complicated by proteinaceous debris or hemorrhage. MRI without with contrast could be used to further evaluate if further characterization is warranted at this time. Alternatively, follow-up CT in 3-6 months could be used to ensure size stability. 4.  Aortic Atherosclerois (ICD10-170.0) 5. Bilateral hip  replacement with streak artifact obscuring much of the inferior anatomic pelvis. Electronically Signed   By: Misty Stanley M.D.   On: 04/30/2017 14:33   Dg Chest Port 1 View  Result Date: 04/27/2017 CLINICAL DATA:  Dyspnea EXAM: PORTABLE CHEST 1 VIEW COMPARISON:  04/25/2017 FINDINGS: COPD. Mild bibasilar airspace disease has progressed in the interval. Negative for heart failure or edema. Small left effusion has developed in the interval. IMPRESSION: Interval development of mild bibasilar atelectasis/ infiltrate. Small left effusion without edema Electronically Signed   By: Franchot Gallo M.D.   On: 04/27/2017 09:45   US Abdomen Limited Ruq  Result Date: 04/27/2017 CLINICAL DATA:  Elevated alkaline phosphatase level. EXAM: ULTRASOUND ABDOMEN LIMITED RIGHT UPPER QUADRANT COMPARISON:  None. FINDINGS: Gallbladder: No gallstones or wall thickening visualized. No sonographic Murphy sign noted by sonographer. Common bile duct: Diameter: 6.7 mm which is within normal limits. Liver: No focal lesion identified. Within normal limits in parenchymal echogenicity. Portal vein is patent on color Doppler imaging with normal direction of blood flow towards the liver. IMPRESSION: No abnormality seen in the right upper quadrant of the abdomen. Electronically Signed   By: Marijo Conception, M.D.   On: 04/27/2017 16:52    Assessment/Plan  1. Bandemia ? If this due to pyelonephritis again. Her white  count did not return to baseline prior to d/c from the hospital. Her family has decided to forego further hospitalizations and testing due to her advancing dementia and poor quality of life.   2. Acute renal failure, unspecified acute renal failure type ([place]) ?obstructive process (bladder scan showed on 56cc in the bladder) Due to underlying infection, impending sepsis, complicated by ARB and NSAID therapy.   3. Chronic bilateral low back pain without sciatica D/C naproxen and add morphine 5 mg q 2 hrs prn  4. PAF (paroxysmal atrial fibrillation) (HCC) Will discontinue xarelto due to acute renal failure and goals of care Continue bisoprolol for rate control at this time  5. Essential hypertension D/C Diovan/HCT due to renal failure BP borderline is soft this afternoon  6. Dementia Progressive decline in cognition and ability per her family If she did recover from this acute illness which is not likely, she would need memory/skilled care  7.Advanced care planning I called her son Martha Hendrix and his wife Martha Hendrix and I also talked to her daughter at the bedside. They feel that given Ms. Opdahl's advanced dementia and poor quality of life, they are not interested in pursuing hospitalization. They also declined IVF and antibiotics. They are aware that her situation is grave.  They have asked that we consult with hospice to help through this difficulty time. Everyone is in agreement with this plan of care and most likely fatal infection.    I spent 60 minutes on this case, >50% in counseling/coordination.  Family/ staff Communication: discussed with her son and daughter, and daughter n law who is a physician Martha Hendrix  Labs/tests ordered:  No further labs due to goals of care

## 2017-05-09 ENCOUNTER — Non-Acute Institutional Stay (SKILLED_NURSING_FACILITY): Payer: Medicare Other | Admitting: Internal Medicine

## 2017-05-09 ENCOUNTER — Encounter: Payer: Self-pay | Admitting: Internal Medicine

## 2017-05-09 DIAGNOSIS — R0682 Tachypnea, not elsewhere classified: Secondary | ICD-10-CM | POA: Diagnosis not present

## 2017-05-09 DIAGNOSIS — R2689 Other abnormalities of gait and mobility: Secondary | ICD-10-CM | POA: Diagnosis not present

## 2017-05-09 DIAGNOSIS — N179 Acute kidney failure, unspecified: Secondary | ICD-10-CM

## 2017-05-09 DIAGNOSIS — M62561 Muscle wasting and atrophy, not elsewhere classified, right lower leg: Secondary | ICD-10-CM | POA: Diagnosis not present

## 2017-05-09 DIAGNOSIS — Q6102 Congenital multiple renal cysts: Secondary | ICD-10-CM | POA: Insufficient documentation

## 2017-05-09 DIAGNOSIS — E43 Unspecified severe protein-calorie malnutrition: Secondary | ICD-10-CM | POA: Insufficient documentation

## 2017-05-09 DIAGNOSIS — N393 Stress incontinence (female) (male): Secondary | ICD-10-CM | POA: Diagnosis not present

## 2017-05-09 DIAGNOSIS — F0390 Unspecified dementia without behavioral disturbance: Secondary | ICD-10-CM

## 2017-05-09 DIAGNOSIS — R278 Other lack of coordination: Secondary | ICD-10-CM | POA: Diagnosis not present

## 2017-05-09 DIAGNOSIS — R918 Other nonspecific abnormal finding of lung field: Secondary | ICD-10-CM | POA: Insufficient documentation

## 2017-05-09 DIAGNOSIS — M545 Low back pain, unspecified: Secondary | ICD-10-CM

## 2017-05-09 DIAGNOSIS — G8929 Other chronic pain: Secondary | ICD-10-CM

## 2017-05-09 DIAGNOSIS — M6259 Muscle wasting and atrophy, not elsewhere classified, multiple sites: Secondary | ICD-10-CM | POA: Diagnosis not present

## 2017-05-09 DIAGNOSIS — M62562 Muscle wasting and atrophy, not elsewhere classified, left lower leg: Secondary | ICD-10-CM | POA: Diagnosis not present

## 2017-05-09 DIAGNOSIS — R748 Abnormal levels of other serum enzymes: Secondary | ICD-10-CM | POA: Insufficient documentation

## 2017-05-09 NOTE — Progress Notes (Signed)
Patient ID: Martha Hendrix, female   DOB: 06/19/1926, 83 y.o.   MRN: [id number]  Location:  [facility] Room Number: 154 rehab Place of Service:  SNF (31) Provider:   L. Mariea Clonts, D.O., C.M.D.  Burnard Bunting, MD  Patient Care Team: Burnard Bunting, MD as PCP - General (Internal Medicine) Gayland Curry, DO as Consulting Physician (Geriatric Medicine)  Extended Emergency Contact Information Primary Emergency Contact: Colbert Ewing [place] Phone: [phone] Relation: Daughter Secondary Emergency Contact: Theodosia Paling [place] Phone: [phone] Relation: Son  Code Status:  DNR, now hospice Goals of care: Advanced Directive information Advanced Directives 05/09/2017  Does Patient Have a Medical Advance Directive? Yes  Type of Advance Directive Out of facility DNR (pink MOST or yellow form);Living will;Healthcare Power of Attorney  Does patient want to make changes to medical advance directive? No - Patient declined  Copy of [place] in Chart? Yes  Would patient like information on creating a medical advance directive? -  Pre-existing out of facility DNR order (yellow form or pink MOST form) Yellow form placed in chart (order not valid for inpatient use)     Chief Complaint  Patient presents with  . Acute Visit    renal failure    HPI:  Pt is a 83 y.o. female seen today for an acute visit for acute renal failure per f/u labs.  Martha Hendrix had returned to [facility] rehab after hospitalization with pyelonephritis.  She has baseline mild to moderate dementia and was having more difficulty managing in [place] (had caregiver assistance minimally).  When she was seen for admission 11/20, she was having severe midback and scapular region pain after being taken off her normal NSAID regimen so this was resumed and her diovan hctz had been restarted upon leaving the hospital.  Unfortunately,  despite these changes, she continues to have increased pain and was found to have acute renal failure with cr into the 4 range.  She is weak.  It's not clear if she will improve from the renal failure at this point.  Her family had discussed her quality of life and goals and requested hospice care.  Nursing asked me to check on her in reference to whether we want to restart the naproxen again (diovan/hct and naproxen stopped again when renal function results returned) and could ativan be added for anxiety and dyspnea if these become problematic for her.  She was looking more comfortable with her feet elevated now (bothered by her back when they dangle, it seems).  Her granddaughter had just visited which seemed to brighten her spirits.     Past Medical History:  Diagnosis Date  . Acute blood loss anemia   . Adenomatous colon polyp   . Anemia    as a child and never had a transfusion  . Arthritis    osteo, takes naprosyn daily  . Atrial fibrillation ([place])   . Bruises easily    on both legs  . Cataract   . Chronic back pain    stenosis  . Congenital spondylolysis, lumbosacral region   . Depression    takes Lexapro daily  . Dysphagia   . Esophageal stricture   . GERD (gastroesophageal reflux disease)    takes Protonix daily  . Glaucoma    uses eye drops  . Headache(784.0)    occasionally  . History of bronchitis    last time unknown  . History of colon polyps   .  Hypertension    takes Diovan and Bystolic daily  . Insomnia    takes Ambien nightly  . Joint pain   . Joint swelling   . Nocturia   . Osteoarthritis of left hip 2015   Status post left hip replacement 10/20/13  . PONV (postoperative nausea and vomiting)   . Senile osteoporosis   . Spinal stenosis of lumbar region    Past Surgical History:  Procedure Laterality Date  . APPENDECTOMY     as a child  . cataract surgery Left 2014  . COLONOSCOPY    . EPIDURAL BLOCK INJECTION    . ESOPHAGOGASTRODUODENOSCOPY     with  ED  . JOINT REPLACEMENT Right 2001   right  . left arm surgery     d/t break as child  . LUMBAR LAMINECTOMY/DECOMPRESSION MICRODISCECTOMY Left 07/19/2013   Procedure: LUMBAR LAMINECTOMY/DECOMPRESSION MICRODISCECTOMY LUMBAR THREE-FOUR,LUMBAR FOUR-FIVE;  Surgeon: Eustace Moore, MD;  Location: [facility];  Service: Neurosurgery;  Laterality: Left;  . TOTAL HIP ARTHROPLASTY Right   . TOTAL HIP ARTHROPLASTY Left 10/22/2013   Procedure: LEFT TOTAL HIP ARTHROPLASTY ANTERIOR APPROACH;  Surgeon: Mauri Pole, MD;  Location: [facility];  Service: Orthopedics;  Laterality: Left;    Allergies  Allergen Reactions  . Codeine Nausea And Vomiting and Other (See Comments)    "spacy feeling"  . Hydrocodone Nausea And Vomiting    spacey feeling   . Celebrex [Celecoxib]     Outpatient Encounter Medications as of 05/09/2017  Medication Sig  . aspirin EC 325 MG tablet Take 1 tablet (325 mg total) by mouth daily.  Marland Kitchen docusate sodium 100 MG CAPS Take 100 mg by mouth 2 (two) times daily.  Marland Kitchen escitalopram (LEXAPRO) 10 MG tablet Take 10 mg by mouth every morning.   . feeding supplement (BOOST HIGH PROTEIN) LIQD Take 1 Container by mouth 2 (two) times daily between meals.  . latanoprost (XALATAN) 0.005 % ophthalmic solution Place 1 drop daily into both eyes.  Marland Kitchen morphine 10 MG/5ML solution Take 5 mg by mouth every 2 (two) hours as needed for severe pain.  . nebivolol (BYSTOLIC) 10 MG tablet Take 1 tablet (10 mg total) daily by mouth.  . pantoprazole (PROTONIX) 40 MG tablet Take 40 mg by mouth daily.  Marland Kitchen tiZANidine (ZANAFLEX) 4 MG capsule Take 1 capsule (4 mg total) by mouth 3 (three) times daily as needed for muscle spasms.  . traMADol (ULTRAM) 50 MG tablet Take 25 mg by mouth 2 (two) times daily as needed.   . zolpidem (AMBIEN) 5 MG tablet Take 1 tablet (5 mg total) at bedtime as needed by mouth for sleep.  . [DISCONTINUED] acetaminophen (TYLENOL) 325 MG tablet Take 650 mg by mouth every 4 (four) hours as needed.    . [DISCONTINUED] donepezil (ARICEPT) 10 MG tablet Take 10 mg daily by mouth.  . [DISCONTINUED] magnesium hydroxide (MILK OF MAGNESIA) 400 MG/5ML suspension Take 45 mLs by mouth daily as needed for mild constipation.  . [DISCONTINUED] Rivaroxaban (XARELTO) 15 MG TABS tablet Take 15 mg by mouth daily.  . [DISCONTINUED] valsartan-hydrochlorothiazide (DIOVAN-HCT) 160-12.5 MG per tablet Take 1 tablet by mouth every morning.    No facility-administered encounter medications on file as of 05/09/2017.     Review of Systems  Constitutional: Positive for activity change and appetite change. Negative for chills and fever.  HENT: Negative for congestion.   Eyes: Negative for visual disturbance.  Respiratory: Negative for shortness of breath.   Cardiovascular: Positive for leg  swelling. Negative for chest pain.  Gastrointestinal: Negative for abdominal pain.  Genitourinary: Negative for dysuria.  Musculoskeletal: Positive for arthralgias and back pain.  Skin: Positive for pallor.  Neurological: Positive for weakness.  Psychiatric/Behavioral: Positive for confusion.    Immunization History  Administered Date(s) Administered  . Pneumococcal Polysaccharide-23 10/02/2011  . Td 06/27/2013  . Tdap 12/10/2012  . Zoster 02/23/2004   Pertinent  Health Maintenance Due  Topic Date Due  . DEXA SCAN  08/17/1993  . PNA vac Low Risk Adult (2 of 2 - PCV13) 10/01/2012  . INFLUENZA VACCINE  01/11/2017   No flowsheet data found. Functional Status Survey:  currently requiring help with adls except to feed herself due to weakness and worsened confusion  Vitals:   05/09/17 1123  BP: 117/77  Pulse: 92  Resp: (!) 28  Temp: (!) 96.8 F (36 C)  TempSrc: Oral  SpO2: 93%  Weight: 172 lb (78 kg)   Body mass index is 28.19 kg/m. Physical Exam  Constitutional: No distress.  Cardiovascular: Normal rate, regular rhythm, normal heart sounds and intact distal pulses.  Pulmonary/Chest: Effort normal and  breath sounds normal. No respiratory distress.  A bit tachypneic  Abdominal: Soft. Bowel sounds are normal. She exhibits no distension. There is no tenderness.  Musculoskeletal:  Appears comfortable with feet elevated  Neurological: She is alert.  Pleasantly confused, denies everything  Skin: Skin is warm and dry.  Psychiatric: She has a normal mood and affect.    Labs reviewed: Recent Labs    04/28/17 0337 04/29/17 0725 04/30/17 0407 05/08/17  NA 132* 133* 131* 135*  K 3.2* 3.4* 3.6 5.6*  CL 98* 99* 97*  --   CO2 _0 --   GLUCOSE 113* 100* 109*  --   BUN 28* 28* 27* 90*  CREATININE 1.36* 1.10* 1.03* 4.3*  CALCIUM 8.4* 8.4* 8.6*  --   MG  --   --  1.6*  --    Recent Labs    04/25/17 0720 04/27/17 0528 04/28/17 0337 05/08/17  AST _1 ALT 15 12* 10* 12  ALKPHOS 211* 214* 189* 343*  BILITOT 1.4* 1.0 0.8  --   PROT 6.6 6.0* 5.7*  --   ALBUMIN 2.6* 2.2* 2.2*  --    Recent Labs    08/02/16 1549 04/25/17 0720  04/28/17 0337 04/29/17 0725 04/30/17 0738 05/08/17  WBC 5.8 12.0*   < > 17.3* 16.1* 15.4* 39.3  NEUTROABS 3.3 8.2*  --   --   --   --   --   HGB 14.3 10.6*   < > 9.8* 9.5* 10.2* 10.5*  HCT 42.7 32.9*   < > 31.8* 30.9* 32.8* 33*  MCV 90.7 88.0   < > 86.6 86.8 86.8  --   PLT 209 475*   < > 428* 405* 487* 635*   < > = values in this interval not displayed.   No results found for: TSH No results found for: HGBA1C No results found for: CHOL, HDL, LDLCALC, LDLDIRECT, TRIG, CHOLHDL  Significant Diagnostic Results in last 30 days:  Dg Chest 2 View  Result Date: 04/25/2017 CLINICAL DATA:  Shortness of breath.  Left flank pain. EXAM: CHEST  2 VIEW COMPARISON:  08/02/2016 . FINDINGS: Mediastinum and hilar structures normal. Cardiomegaly with normal pulmonary vascularity. Mild basilar atelectasis and/or scarring. Tiny left pleural effusion cannot be excluded. No pneumothorax. Diffuse thoracic spine osteopenia degenerative change . IMPRESSION: Mild  basilar atelectasis  and/or scarring . Tiny left pleural effusion cannot be excluded. No pneumothorax. Electronically Signed   By: Marcello Moores  Register   On: 04/25/2017 07:55   Ct Chest W Contrast  Result Date: 04/30/2017 CLINICAL DATA:  Fever of unknown origin. EXAM: CT CHEST, ABDOMEN, AND PELVIS WITH CONTRAST TECHNIQUE: Multidetector CT imaging of the chest, abdomen and pelvis was performed following the standard protocol during bolus administration of intravenous contrast. CONTRAST:  153m ISOVUE-300 IOPAMIDOL (ISOVUE-300) INJECTION 61% COMPARISON:  None. FINDINGS: CT CHEST FINDINGS Cardiovascular: Heart size upper normal. No substantial pericardial effusion. No thoracic aortic aneurysm. Mediastinum/Nodes: 11 mm short axis subcarinal lymph node is upper normal. There is no hilar lymphadenopathy. The esophagus has normal imaging features. There is no axillary lymphadenopathy. Lungs/Pleura: 6 mm right middle lobe pulmonary nodule seen image 85 series 4. Subsegmental atelectasis evident in both lower lobes. 6 mm left lower lobe pulmonary nodule visible on image 78 series 4. No pulmonary edema. Small left pleural effusion noted. Musculoskeletal: Mild superior endplate compression deformity is identified at T4. CT ABDOMEN PELVIS FINDINGS Hepatobiliary: No focal abnormality within the liver parenchyma. There is no evidence for gallstones, gallbladder wall thickening, or pericholecystic fluid. No intrahepatic or extrahepatic biliary dilation. Pancreas: No focal mass lesion. No dilatation of the main duct. No intraparenchymal cyst. No peripancreatic edema. Spleen: No splenomegaly. No focal mass lesion. Adrenals/Urinary Tract: No adrenal nodule or mass. 3.9 cm homogeneous low-density lesion lower pole right kidney is well-defined and has attenuation slightly higher than would be expected for a simple cyst. 4.2 cm homogeneous exophytic lesion lower pole left kidney has similar attenuation. No evidence for hydroureter.  Bladder is obscured by streak artifact from bilateral hip replacement. Stomach/Bowel: Stomach is nondistended. No gastric wall thickening. No evidence of outlet obstruction. Duodenum is normally positioned as is the ligament of Treitz. No small bowel wall thickening. No small bowel dilatation. The terminal ileum is normal. The appendix is not visualized, but there is no edema or inflammation in the region of the cecum. Colon unremarkable. Vascular/Lymphatic: There is abdominal aortic atherosclerosis without aneurysm. There is no gastrohepatic or hepatoduodenal ligament lymphadenopathy. No intraperitoneal or retroperitoneal lymphadenopathy. Pelvic sidewalls obscured by streak artifact. Reproductive: Uterus largely obscured by streak artifact. There is no adnexal mass. Other: No substantial intraperitoneal free fluid although cul-de-sac is obscured. Musculoskeletal: Status post bilateral hip replacement. Bones are diffusely demineralized. Degenerative changes noted upper lumbar spine. IMPRESSION: 1. No acute findings in the chest, abdomen, or pelvis to explain the patient's history of fever. 2. Bilateral pulmonary nodules measuring up to 6 mm. Non-contrast chest CT at 3-6 months is recommended. If the nodules are stable at time of repeat CT, then future CT at 18-24 months (from today's scan) is considered optional for low-risk patients, but is recommended for high-risk patients. This recommendation follows the consensus statement: Guidelines for Management of Incidental Pulmonary Nodules Detected on CT Images: From the Fleischner Society 2017; Radiology 2017; 284:228-243. 3. Large bilateral low-density renal lesions are likely cysts but have attenuation higher than would be expected for a simple cyst. These are probably cysts complicated by proteinaceous debris or hemorrhage. MRI without with contrast could be used to further evaluate if further characterization is warranted at this time. Alternatively, follow-up CT  in 3-6 months could be used to ensure size stability. 4.  Aortic Atherosclerois (ICD10-170.0) 5. Bilateral hip replacement with streak artifact obscuring much of the inferior anatomic pelvis. Electronically Signed   By: EMisty StanleyM.D.   On: 04/30/2017 14:33  Ct Abdomen Pelvis W Contrast  Result Date: 04/30/2017 CLINICAL DATA:  Fever of unknown origin. EXAM: CT CHEST, ABDOMEN, AND PELVIS WITH CONTRAST TECHNIQUE: Multidetector CT imaging of the chest, abdomen and pelvis was performed following the standard protocol during bolus administration of intravenous contrast. CONTRAST:  147m ISOVUE-300 IOPAMIDOL (ISOVUE-300) INJECTION 61% COMPARISON:  None. FINDINGS: CT CHEST FINDINGS Cardiovascular: Heart size upper normal. No substantial pericardial effusion. No thoracic aortic aneurysm. Mediastinum/Nodes: 11 mm short axis subcarinal lymph node is upper normal. There is no hilar lymphadenopathy. The esophagus has normal imaging features. There is no axillary lymphadenopathy. Lungs/Pleura: 6 mm right middle lobe pulmonary nodule seen image 85 series 4. Subsegmental atelectasis evident in both lower lobes. 6 mm left lower lobe pulmonary nodule visible on image 78 series 4. No pulmonary edema. Small left pleural effusion noted. Musculoskeletal: Mild superior endplate compression deformity is identified at T4. CT ABDOMEN PELVIS FINDINGS Hepatobiliary: No focal abnormality within the liver parenchyma. There is no evidence for gallstones, gallbladder wall thickening, or pericholecystic fluid. No intrahepatic or extrahepatic biliary dilation. Pancreas: No focal mass lesion. No dilatation of the main duct. No intraparenchymal cyst. No peripancreatic edema. Spleen: No splenomegaly. No focal mass lesion. Adrenals/Urinary Tract: No adrenal nodule or mass. 3.9 cm homogeneous low-density lesion lower pole right kidney is well-defined and has attenuation slightly higher than would be expected for a simple cyst. 4.2 cm  homogeneous exophytic lesion lower pole left kidney has similar attenuation. No evidence for hydroureter. Bladder is obscured by streak artifact from bilateral hip replacement. Stomach/Bowel: Stomach is nondistended. No gastric wall thickening. No evidence of outlet obstruction. Duodenum is normally positioned as is the ligament of Treitz. No small bowel wall thickening. No small bowel dilatation. The terminal ileum is normal. The appendix is not visualized, but there is no edema or inflammation in the region of the cecum. Colon unremarkable. Vascular/Lymphatic: There is abdominal aortic atherosclerosis without aneurysm. There is no gastrohepatic or hepatoduodenal ligament lymphadenopathy. No intraperitoneal or retroperitoneal lymphadenopathy. Pelvic sidewalls obscured by streak artifact. Reproductive: Uterus largely obscured by streak artifact. There is no adnexal mass. Other: No substantial intraperitoneal free fluid although cul-de-sac is obscured. Musculoskeletal: Status post bilateral hip replacement. Bones are diffusely demineralized. Degenerative changes noted upper lumbar spine. IMPRESSION: 1. No acute findings in the chest, abdomen, or pelvis to explain the patient's history of fever. 2. Bilateral pulmonary nodules measuring up to 6 mm. Non-contrast chest CT at 3-6 months is recommended. If the nodules are stable at time of repeat CT, then future CT at 18-24 months (from today's scan) is considered optional for low-risk patients, but is recommended for high-risk patients. This recommendation follows the consensus statement: Guidelines for Management of Incidental Pulmonary Nodules Detected on CT Images: From the Fleischner Society 2017; Radiology 2017; 284:228-243. 3. Large bilateral low-density renal lesions are likely cysts but have attenuation higher than would be expected for a simple cyst. These are probably cysts complicated by proteinaceous debris or hemorrhage. MRI without with contrast could be  used to further evaluate if further characterization is warranted at this time. Alternatively, follow-up CT in 3-6 months could be used to ensure size stability. 4.  Aortic Atherosclerois (ICD10-170.0) 5. Bilateral hip replacement with streak artifact obscuring much of the inferior anatomic pelvis. Electronically Signed   By: EMisty StanleyM.D.   On: 04/30/2017 14:33   Dg Chest Port 1 View  Result Date: 04/27/2017 CLINICAL DATA:  Dyspnea EXAM: PORTABLE CHEST 1 VIEW COMPARISON:  04/25/2017 FINDINGS: COPD. Mild bibasilar  airspace disease has progressed in the interval. Negative for heart failure or edema. Small left effusion has developed in the interval. IMPRESSION: Interval development of mild bibasilar atelectasis/ infiltrate. Small left effusion without edema Electronically Signed   By: Franchot Gallo M.D.   On: 04/27/2017 09:45   US Abdomen Limited Ruq  Result Date: 04/27/2017 CLINICAL DATA:  Elevated alkaline phosphatase level. EXAM: ULTRASOUND ABDOMEN LIMITED RIGHT UPPER QUADRANT COMPARISON:  None. FINDINGS: Gallbladder: No gallstones or wall thickening visualized. No sonographic Murphy sign noted by sonographer. Common bile duct: Diameter: 6.7 mm which is within normal limits. Liver: No focal lesion identified. Within normal limits in parenchymal echogenicity. Portal vein is patent on color Doppler imaging with normal direction of blood flow towards the liver. IMPRESSION: No abnormality seen in the right upper quadrant of the abdomen. Electronically Signed   By: Marijo Conception, M.D.   On: 04/27/2017 16:52    Assessment/Plan 1. Acute renal failure, unspecified acute renal failure type ([place]) -multfactorial--had pyeloneprhitis which may not be fully resolved given bandemia, also was restarted on arb/diuretic and intake not great, plus nsaid resumed for pain mgt so these are now on hold -might recheck bmp if she seems to be clinically improving next week -she is now on hospice care   2. Chronic  bilateral low back pain without sciatica -will avoid adding naproxen b/c she may improve from this, had zanaflex added for possible muscular component and has roxanol for pain  3. Dementia without behavioral disturbance, unspecified dementia type -mild to moderate, was progressing and needing more adl help even before hospitalization, cont supportive care in rehab  4. Multiple renal cysts -no further workup, felt to be nonobstructive, ? More than cysts causing her renal issues--not clear  5.  Tachypnea -add ativan if needed for anxiety and dypsnea, but does not appear uncomfortable now (noted at time of vitals)   Family/ staff Communication: discussed with rehab nurse and met granddaughter today  Labs/tests ordered:  Consider bmp next week if she looks better, would not repeat if she's not improving due to goals of care being comfort focused   L. , D.O. [facility] [address], [place] [address] Cell Phone (Mon-Fri 8am-5pm):  [phone] On Call:  [phone] & follow prompts after 5pm & weekends Office Phone:  [phone] Office Fax:  [phone]

## 2017-05-10 DIAGNOSIS — I10 Essential (primary) hypertension: Secondary | ICD-10-CM | POA: Diagnosis not present

## 2017-05-10 DIAGNOSIS — D72829 Elevated white blood cell count, unspecified: Secondary | ICD-10-CM | POA: Diagnosis not present

## 2017-05-10 DIAGNOSIS — I4891 Unspecified atrial fibrillation: Secondary | ICD-10-CM | POA: Diagnosis not present

## 2017-05-10 DIAGNOSIS — N179 Acute kidney failure, unspecified: Secondary | ICD-10-CM | POA: Diagnosis not present

## 2017-05-10 DIAGNOSIS — F309 Manic episode, unspecified: Secondary | ICD-10-CM | POA: Diagnosis not present

## 2017-05-10 DIAGNOSIS — G309 Alzheimer's disease, unspecified: Secondary | ICD-10-CM | POA: Diagnosis not present

## 2017-05-12 DIAGNOSIS — G309 Alzheimer's disease, unspecified: Secondary | ICD-10-CM | POA: Diagnosis not present

## 2017-05-12 DIAGNOSIS — F309 Manic episode, unspecified: Secondary | ICD-10-CM | POA: Diagnosis not present

## 2017-05-12 DIAGNOSIS — N179 Acute kidney failure, unspecified: Secondary | ICD-10-CM | POA: Diagnosis not present

## 2017-05-12 DIAGNOSIS — I4891 Unspecified atrial fibrillation: Secondary | ICD-10-CM | POA: Diagnosis not present

## 2017-05-12 DIAGNOSIS — I10 Essential (primary) hypertension: Secondary | ICD-10-CM | POA: Diagnosis not present

## 2017-05-12 DIAGNOSIS — D72829 Elevated white blood cell count, unspecified: Secondary | ICD-10-CM | POA: Diagnosis not present

## 2017-05-13 DIAGNOSIS — N179 Acute kidney failure, unspecified: Secondary | ICD-10-CM | POA: Diagnosis not present

## 2017-05-13 DIAGNOSIS — E039 Hypothyroidism, unspecified: Secondary | ICD-10-CM | POA: Diagnosis not present

## 2017-05-13 DIAGNOSIS — H409 Unspecified glaucoma: Secondary | ICD-10-CM | POA: Diagnosis not present

## 2017-05-13 DIAGNOSIS — I10 Essential (primary) hypertension: Secondary | ICD-10-CM | POA: Diagnosis not present

## 2017-05-13 DIAGNOSIS — I4891 Unspecified atrial fibrillation: Secondary | ICD-10-CM | POA: Diagnosis not present

## 2017-05-13 DIAGNOSIS — D72829 Elevated white blood cell count, unspecified: Secondary | ICD-10-CM | POA: Diagnosis not present

## 2017-05-13 DIAGNOSIS — G309 Alzheimer's disease, unspecified: Secondary | ICD-10-CM | POA: Diagnosis not present

## 2017-05-13 DIAGNOSIS — F309 Manic episode, unspecified: Secondary | ICD-10-CM | POA: Diagnosis not present

## 2017-05-15 DIAGNOSIS — F309 Manic episode, unspecified: Secondary | ICD-10-CM | POA: Diagnosis not present

## 2017-05-15 DIAGNOSIS — I10 Essential (primary) hypertension: Secondary | ICD-10-CM | POA: Diagnosis not present

## 2017-05-15 DIAGNOSIS — D72829 Elevated white blood cell count, unspecified: Secondary | ICD-10-CM | POA: Diagnosis not present

## 2017-05-15 DIAGNOSIS — G309 Alzheimer's disease, unspecified: Secondary | ICD-10-CM | POA: Diagnosis not present

## 2017-05-15 DIAGNOSIS — N179 Acute kidney failure, unspecified: Secondary | ICD-10-CM | POA: Diagnosis not present

## 2017-05-15 DIAGNOSIS — I4891 Unspecified atrial fibrillation: Secondary | ICD-10-CM | POA: Diagnosis not present

## 2017-06-13 DEATH — deceased

## 2018-11-28 IMAGING — DX DG CHEST 2V
2 series · 2 of 2 positions shown · non-contrast
Comparison: 08/02/2016 .

CLINICAL DATA: Shortness of breath.  Left flank pain.

EXAM:
CHEST  2 VIEW

[chest pa]
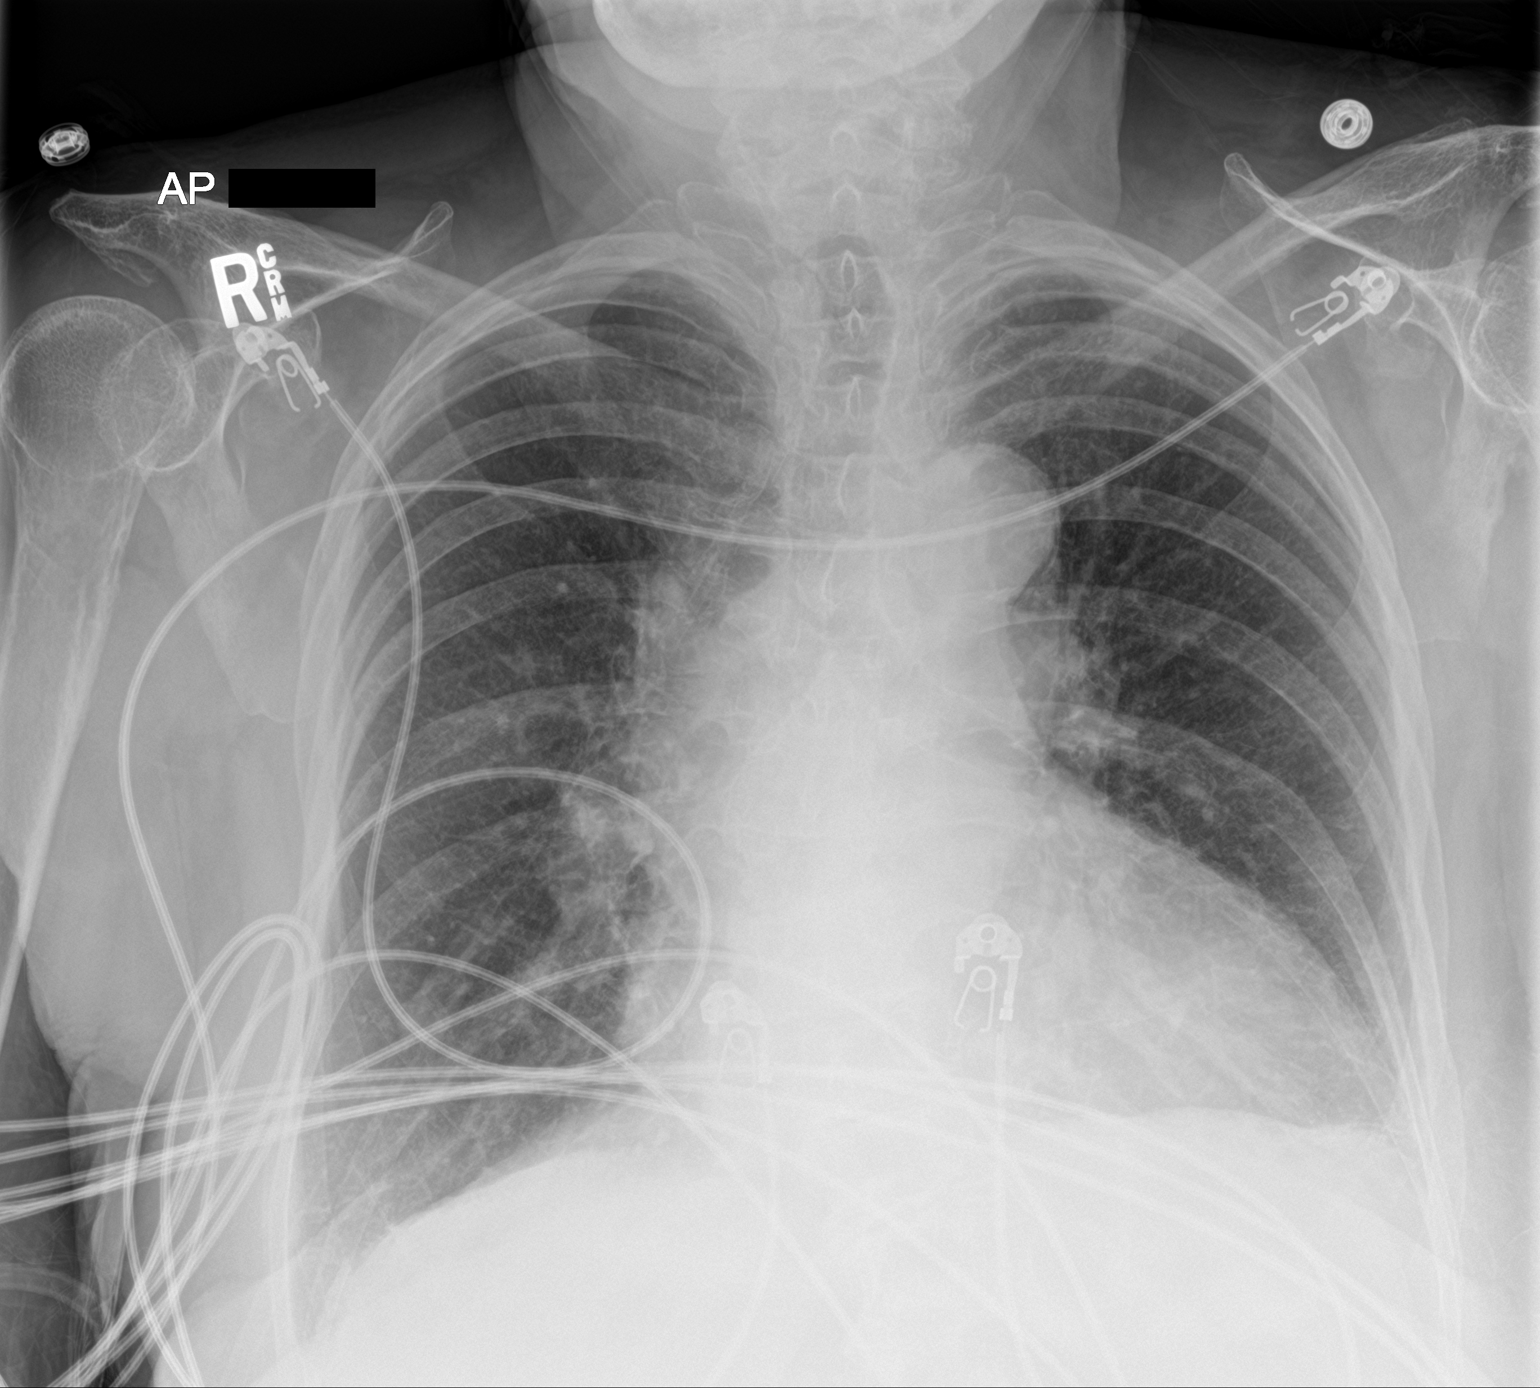

[chest lat]
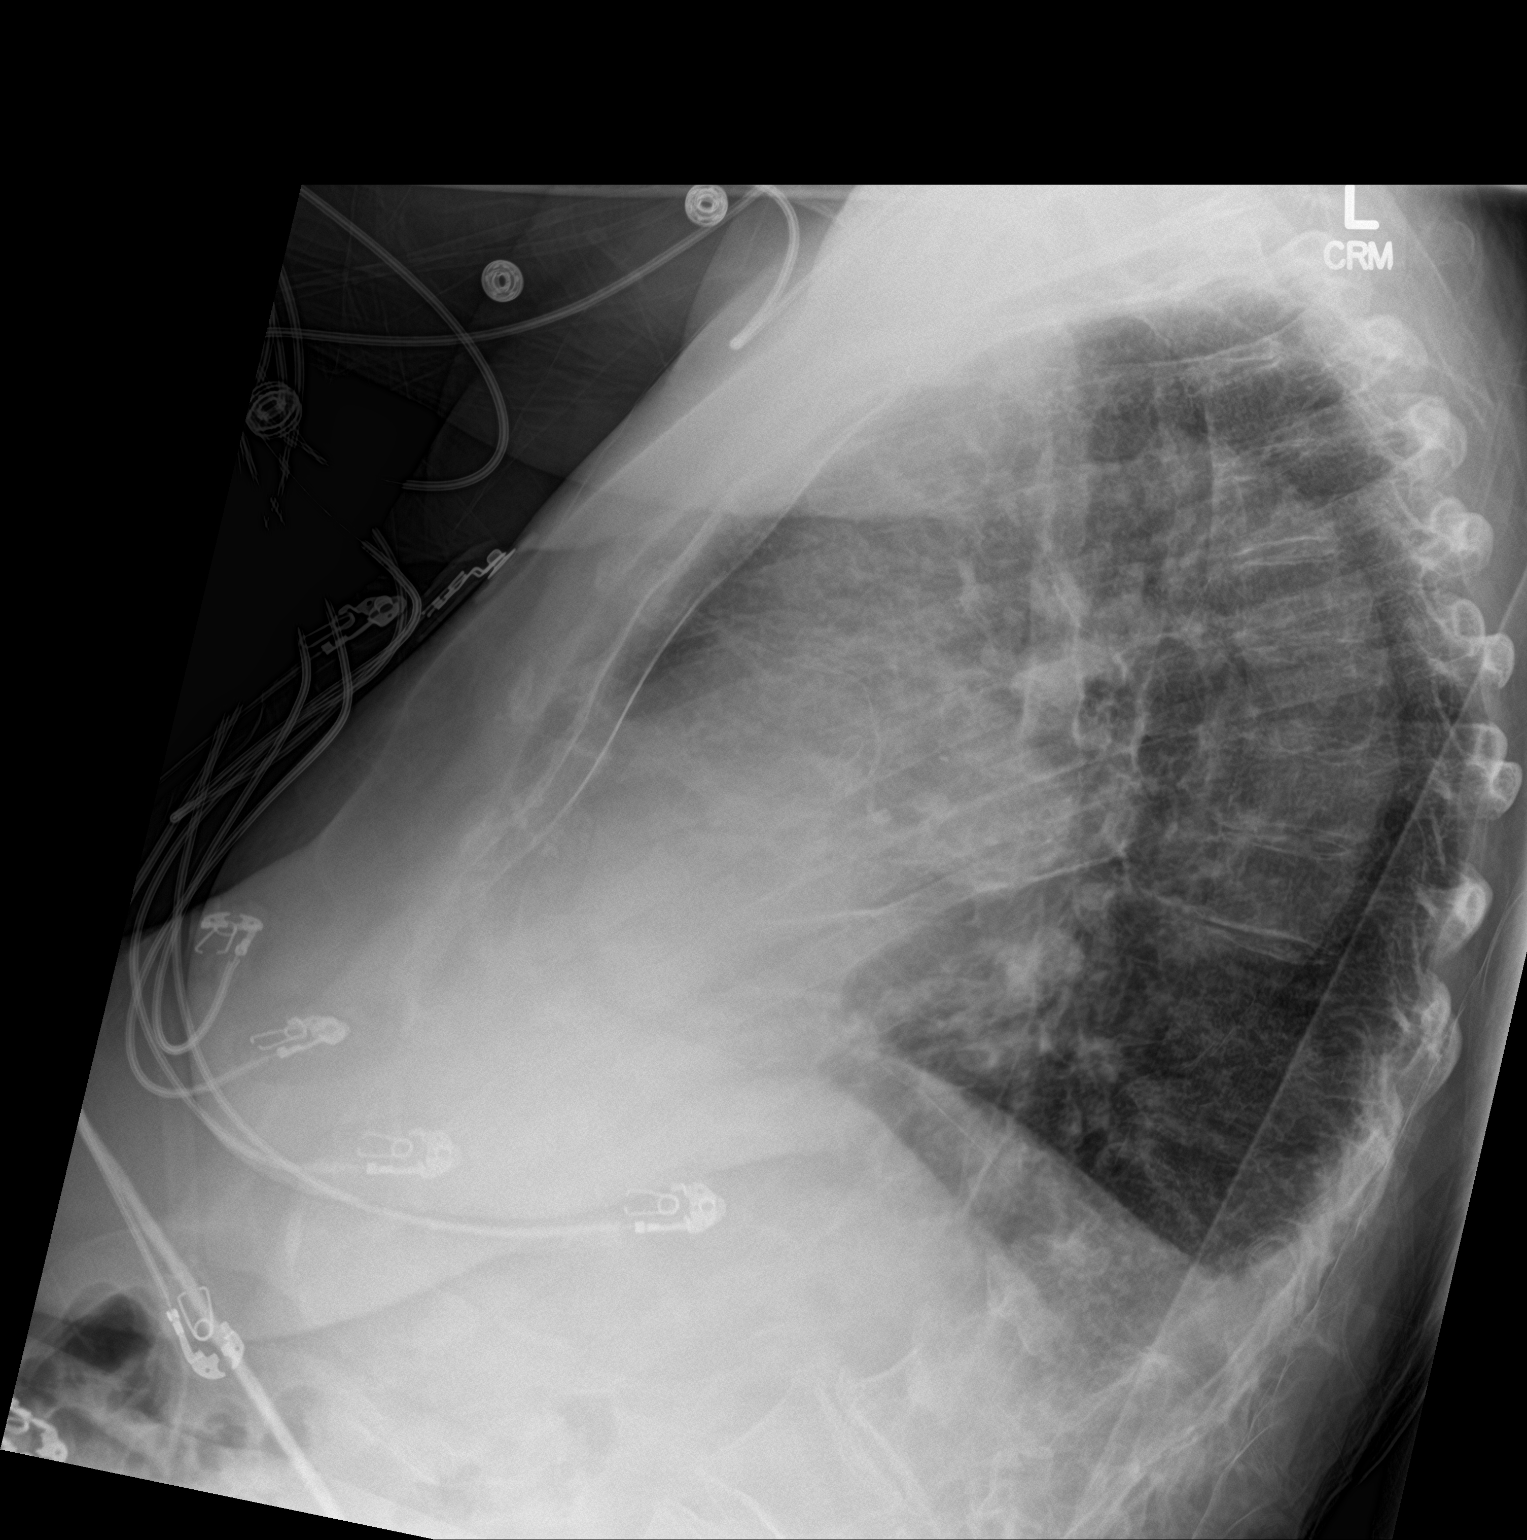

[2 of 2 positions shown; findings below may reference images not displayed]

FINDINGS: Mediastinum and hilar structures normal. Cardiomegaly with normal
pulmonary vascularity. Mild basilar atelectasis and/or scarring.
Tiny left pleural effusion cannot be excluded. No pneumothorax.
Diffuse thoracic spine osteopenia degenerative change .
IMPRESSION: Mild basilar atelectasis and/or scarring . Tiny left pleural
effusion cannot be excluded. No pneumothorax.

## 2018-11-30 IMAGING — DX DG CHEST 1V PORT
1 series · 1 of 1 positions shown · non-contrast
Comparison: 04/25/2017

CLINICAL DATA: Dyspnea

EXAM:
PORTABLE CHEST 1 VIEW

[chest ap]
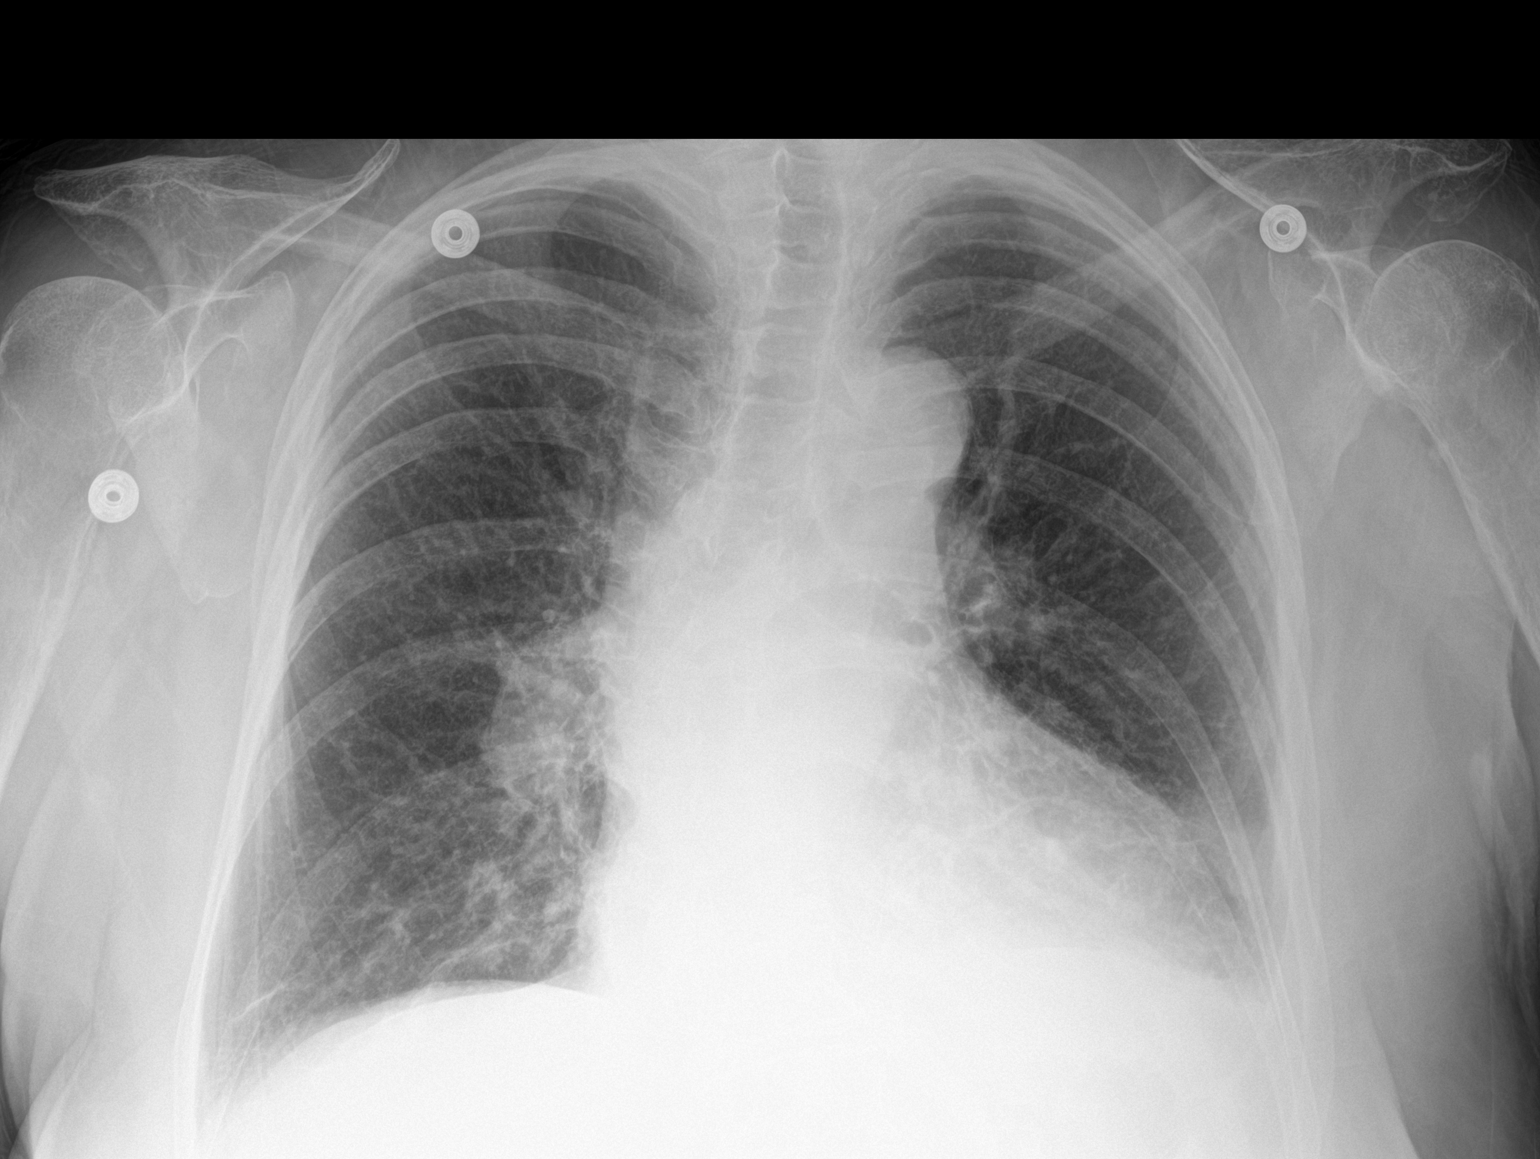

[1 of 1 positions shown; findings below may reference images not displayed]

FINDINGS: COPD. Mild bibasilar airspace disease has progressed in the
interval. Negative for heart failure or edema. Small left effusion
has developed in the interval.
IMPRESSION: Interval development of mild bibasilar atelectasis/ infiltrate.
Small left effusion without edema
